# Patient Record
Sex: Male | Born: 1980 | Race: White | Hispanic: No | Marital: Single | Smoking: Former smoker
Health system: Southern US, Community
[De-identification: ages and names within clinical notes are randomized; demographics above are authoritative.]

## PROBLEM LIST (undated history)

## (undated) DIAGNOSIS — K5900 Constipation, unspecified: Secondary | ICD-10-CM

## (undated) DIAGNOSIS — G44229 Chronic tension-type headache, not intractable: Secondary | ICD-10-CM

## (undated) DIAGNOSIS — K219 Gastro-esophageal reflux disease without esophagitis: Secondary | ICD-10-CM

## (undated) DIAGNOSIS — K649 Unspecified hemorrhoids: Secondary | ICD-10-CM

## (undated) DIAGNOSIS — F329 Major depressive disorder, single episode, unspecified: Secondary | ICD-10-CM

## (undated) DIAGNOSIS — F32A Depression, unspecified: Secondary | ICD-10-CM

## (undated) HISTORY — DX: Gastro-esophageal reflux disease without esophagitis: K21.9

## (undated) HISTORY — DX: Chronic tension-type headache, not intractable: G44.229

## (undated) HISTORY — DX: Hemochromatosis, unspecified: E83.119

## (undated) HISTORY — DX: Constipation, unspecified: K59.00

## (undated) HISTORY — DX: Unspecified hemorrhoids: K64.9

---
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
## 2007-03-13 ENCOUNTER — Ambulatory Visit: Payer: Self-pay | Admitting: Family Medicine

## 2013-07-26 ENCOUNTER — Encounter: Payer: Self-pay | Admitting: Family Medicine

## 2013-07-26 ENCOUNTER — Ambulatory Visit (INDEPENDENT_AMBULATORY_CARE_PROVIDER_SITE_OTHER): Payer: 59 | Admitting: Family Medicine

## 2013-07-26 VITALS — BP 114/72 | HR 71 | Temp 97.0°F | Ht 68.5 in | Wt 125.4 lb

## 2013-07-26 DIAGNOSIS — K089 Disorder of teeth and supporting structures, unspecified: Secondary | ICD-10-CM

## 2013-07-26 DIAGNOSIS — Z0000 Encounter for general adult medical examination without abnormal findings: Secondary | ICD-10-CM

## 2013-07-26 LAB — POCT CBC
Granulocyte percent: 69.3 %G (ref 37–80)
HCT, POC: 41.1 % — AB (ref 43.5–53.7)
Hemoglobin: 14 g/dL — AB (ref 14.1–18.1)
Lymph, poc: 1.1 (ref 0.6–3.4)
MCH, POC: 31.8 pg — AB (ref 27–31.2)
MCHC: 34.2 g/dL (ref 31.8–35.4)
MCV: 93 fL (ref 80–97)
MPV: 7.9 fL (ref 0–99.8)
POC Granulocyte: 4 (ref 2–6.9)
POC LYMPH PERCENT: 20 %L (ref 10–50)
Platelet Count, POC: 182 10*3/uL (ref 142–424)
RBC: 4.4 M/uL — AB (ref 4.69–6.13)
RDW, POC: 12.9 %
WBC: 5.7 10*3/uL (ref 4.6–10.2)

## 2013-07-26 NOTE — Progress Notes (Signed)
  Subjective:    Patient ID: Jacob Eaton, male    DOB: 1980/12/22, 32 y.o.   MRN: [id number]  HPI This 32 y.o. male presents for evaluation of CPE.  He has hx of poor dentition And he is planning on getting his lower teeth extracted.  He otherwise has been Doing fine and has no acute complaints.  He is thin and is going to try and gain 10 pounds.   Review of Systems C/o poor dentition. No chest pain, SOB, HA, dizziness, vision change, N/V, diarrhea, constipation, dysuria, urinary urgency or frequency, myalgias, arthralgias or rash.     Objective:   Physical Exam Vital signs noted  Thin male in NAD.  HEENT - Head atraumatic Normocephalic                Eyes - PERRLA, Conjuctiva - clear Sclera- Clear EOMI                Ears - EAC's Wnl TM's Wnl Gross Hearing WNL                Nose - Nares patent                 Throat - oropharanx wnl poor dentition and no teeth on upper. Respiratory - Lungs CTA bilateral Cardiac - RRR S1 and S2 without murmur GI - Abdomen soft Nontender and bowel sounds active x 4 Extremities - No edema. Neuro - Grossly intact.       Assessment & Plan:  Routine general medical examination at a health care facility - Plan: POCT CBC, CMP14+EGFR, Lipid panel, Thyroid Panel With TSH  Poor dentition- Follow up with dentist.  Discussed he should gain some weight and make sure eating 3 meals a day.  Deatra Canter FNP

## 2013-07-26 NOTE — Patient Instructions (Signed)
Testicular Self-Exam  A self-examination of your testicles involves looking at and feeling your testicles for abnormal lumps or swelling. Several things can cause swelling, lumps, or pain in your testicles. Some of these causes are:  · Injuries.  · Inflammation.  · Infection.  · Accumulation of fluids around your testicle (hydrocele).  · Twisted testicles (testicular torsion).  · Testicular cancer.  Self-examination of the testicles and groin areas may be advised if you are at risk for testicular cancer. Risks for testicular cancer include:  · An undescended testicle (cryptorchidism).  · A history of previous testicular cancer.  · A family history of testicular cancer.  The testicles are easiest to examine after warm baths or showers and are more difficult to examine when you are cold. This is because the muscles attached to the testicles retract and pull them up higher or into the abdomen.  Follow these steps while you are standing:  · Hold your penis away from your body.  · Roll one testicle between your thumb and forefinger, feeling the entire testicle.  · Roll the other testicle between your thumb and forefinger, feeling the entire testicle.  Feel for lumps, swelling, or discomfort. A normal testicle is egg shaped and feels firm. It is smooth and not tender. The spermatic cord can be felt as a firm spaghetti-like cord at the back of your testicle. It is also important to examine the crease between the front of your leg and your abdomen. Feel for any bumps that are tender. These could be enlarged lymph nodes.   Document Released: 12/12/2000 Document Revised: 05/08/2013 Document Reviewed: 02/25/2013  [employer]® Patient Information ©2014 [employer].

## 2013-07-27 LAB — CMP14+EGFR
ALT: 20 IU/L (ref 0–44)
AST: 20 IU/L (ref 0–40)
Albumin/Globulin Ratio: 2.3 (ref 1.1–2.5)
Albumin: 4.6 g/dL (ref 3.5–5.5)
Alkaline Phosphatase: 162 IU/L — ABNORMAL HIGH (ref 39–117)
BUN/Creatinine Ratio: 11 (ref 8–19)
BUN: 10 mg/dL (ref 6–20)
CO2: 25 mmol/L (ref 18–29)
Calcium: 9.3 mg/dL (ref 8.7–10.2)
Chloride: 99 mmol/L (ref 97–108)
Creatinine, Ser: 0.88 mg/dL (ref 0.76–1.27)
GFR calc Af Amer: 130 mL/min/{1.73_m2} (ref >59)
GFR calc non Af Amer: 113 mL/min/{1.73_m2} (ref >59)
Globulin, Total: 2 g/dL (ref 1.5–4.5)
Glucose: 87 mg/dL (ref 65–99)
Potassium: 3.9 mmol/L (ref 3.5–5.2)
Sodium: 139 mmol/L (ref 134–144)
Total Bilirubin: 0.5 mg/dL (ref 0.0–1.2)
Total Protein: 6.6 g/dL (ref 6.0–8.5)

## 2013-07-27 LAB — LIPID PANEL
Chol/HDL Ratio: 3.5 ratio units (ref 0.0–5.0)
Cholesterol, Total: 187 mg/dL (ref 100–199)
HDL: 54 mg/dL (ref >39)
LDL Calculated: 116 mg/dL — ABNORMAL HIGH (ref 0–99)
Triglycerides: 87 mg/dL (ref 0–149)
VLDL Cholesterol Cal: 17 mg/dL (ref 5–40)

## 2013-07-27 LAB — THYROID PANEL WITH TSH
Free Thyroxine Index: 2.6 (ref 1.2–4.9)
T3 Uptake Ratio: 35 % (ref 24–39)
T4, Total: 7.3 ug/dL (ref 4.5–12.0)
TSH: 2.05 u[IU]/mL (ref 0.450–4.500)

## 2014-07-10 ENCOUNTER — Telehealth: Payer: Self-pay | Admitting: Family Medicine

## 2014-07-11 NOTE — Telephone Encounter (Signed)
Patient stated that he will call back to schedule once he gets his schedule

## 2016-07-18 ENCOUNTER — Emergency Department (HOSPITAL_COMMUNITY): Payer: BLUE CROSS/BLUE SHIELD

## 2016-07-18 ENCOUNTER — Encounter (HOSPITAL_COMMUNITY): Payer: Self-pay | Admitting: Emergency Medicine

## 2016-07-18 ENCOUNTER — Emergency Department (HOSPITAL_COMMUNITY)
Admission: EM | Admit: 2016-07-18 | Discharge: 2016-07-18 | Disposition: A | Discharge status: Home / Self Care | Payer: BLUE CROSS/BLUE SHIELD | Attending: Emergency Medicine | Admitting: Emergency Medicine

## 2016-07-18 DIAGNOSIS — Z87891 Personal history of nicotine dependence: Secondary | ICD-10-CM | POA: Insufficient documentation

## 2016-07-18 DIAGNOSIS — R110 Nausea: Secondary | ICD-10-CM | POA: Insufficient documentation

## 2016-07-18 DIAGNOSIS — R1031 Right lower quadrant pain: Secondary | ICD-10-CM | POA: Diagnosis not present

## 2016-07-18 LAB — CBC
HCT: 43.3 % (ref 39.0–52.0)
Hemoglobin: 14.9 g/dL (ref 13.0–17.0)
MCH: 33 pg (ref 26.0–34.0)
MCHC: 34.4 g/dL (ref 30.0–36.0)
MCV: 95.8 fL (ref 78.0–100.0)
PLATELETS: 206 10*3/uL (ref 150–400)
RBC: 4.52 MIL/uL (ref 4.22–5.81)
RDW: 12.6 % (ref 11.5–15.5)
WBC: 4.3 10*3/uL (ref 4.0–10.5)

## 2016-07-18 LAB — URINALYSIS, ROUTINE W REFLEX MICROSCOPIC
BILIRUBIN URINE: NEGATIVE
Glucose, UA: NEGATIVE mg/dL
HGB URINE DIPSTICK: NEGATIVE
Ketones, ur: NEGATIVE mg/dL
Leukocytes, UA: NEGATIVE
Nitrite: NEGATIVE
PROTEIN: NEGATIVE mg/dL
Specific Gravity, Urine: 1.01 (ref 1.005–1.030)
pH: 7 (ref 5.0–8.0)

## 2016-07-18 LAB — COMPREHENSIVE METABOLIC PANEL
ALK PHOS: 185 U/L — AB (ref 38–126)
ALT: 44 U/L (ref 17–63)
AST: 33 U/L (ref 15–41)
Albumin: 4.3 g/dL (ref 3.5–5.0)
Anion gap: 7 (ref 5–15)
BILIRUBIN TOTAL: 0.6 mg/dL (ref 0.3–1.2)
BUN: 12 mg/dL (ref 6–20)
CALCIUM: 9.1 mg/dL (ref 8.9–10.3)
CO2: 25 mmol/L (ref 22–32)
CREATININE: 0.93 mg/dL (ref 0.61–1.24)
Chloride: 104 mmol/L (ref 101–111)
GFR calc non Af Amer: >60 mL/min (ref >60)
Glucose, Bld: 84 mg/dL (ref 65–99)
Potassium: 3.6 mmol/L (ref 3.5–5.1)
SODIUM: 136 mmol/L (ref 135–145)
Total Protein: 7.1 g/dL (ref 6.5–8.1)

## 2016-07-18 LAB — LIPASE, BLOOD: Lipase: 15 U/L (ref 11–51)

## 2016-07-18 MED ORDER — ONDANSETRON 4 MG PO TBDP: ORAL_TABLET | ORAL | 0 refills | Status: DC

## 2016-07-18 MED ORDER — IOPAMIDOL (ISOVUE-300) INJECTION 61%
100.0000 mL | Freq: Once | INTRAVENOUS | Status: AC | PRN
  Administered 2016-07-18: 100 mL via INTRAVENOUS

## 2016-07-18 MED ORDER — PANTOPRAZOLE SODIUM 20 MG PO TBEC: 20.0000 mg | DELAYED_RELEASE_TABLET | Freq: Every day | ORAL | 0 refills | Status: DC

## 2016-07-18 MED ORDER — TRAMADOL HCL 50 MG PO TABS: 50.0000 mg | ORAL_TABLET | Freq: Four times a day (QID) | ORAL | 0 refills | Status: DC | PRN

## 2016-07-18 MED ORDER — IOPAMIDOL (ISOVUE-300) INJECTION 61%
INTRAVENOUS | Status: AC
  Filled 2016-07-18: qty 30

## 2016-07-18 MED ORDER — ONDANSETRON HCL 4 MG/2ML IJ SOLN
4.0000 mg | Freq: Once | INTRAMUSCULAR | Status: AC
  Administered 2016-07-18: 4 mg via INTRAVENOUS
  Filled 2016-07-18: qty 2

## 2016-07-18 MED ORDER — HYDROMORPHONE HCL 1 MG/ML IJ SOLN
1.0000 mg | Freq: Once | INTRAMUSCULAR | Status: AC
  Administered 2016-07-18: 1 mg via INTRAVENOUS
  Filled 2016-07-18: qty 1

## 2016-07-18 NOTE — ED Triage Notes (Signed)
Patient complaining of right lower quadrant abdominal pain x 2 weeks. Denies vomiting or diarrhea.

## 2016-07-18 NOTE — Discharge Instructions (Signed)
Follow-up with the GI doctor,  dr. Jena Gaussourk in 1-2 weeks

## 2016-07-18 NOTE — ED Provider Notes (Signed)
[facility]EM[facility]ENCY DEPT Provider Note   CSN: [id number] Arrival date & time: 07/18/16  1154  By signing my name below, I, Avnee Patel, attest that this documentation has been prepared under the direction and in the presence of Bethann BerkshireJoseph Melek Pownall, MD  Electronically Signed: Clovis PuAvnee Patel, ED Scribe. 07/18/16. 12:28 PM.   History   Chief Complaint Chief Complaint  Patient presents with  . Abdominal Pain   The history is provided by the patient. No language interpreter was used.  Abdominal Pain   This is a new problem. The current episode started more than 1 week ago. The problem has not changed since onset.The pain is located in the RLQ. The quality of the pain is sharp. The pain is moderate. Associated symptoms include nausea. Pertinent negatives include fever, diarrhea, vomiting, frequency, hematuria and headaches. Nothing relieves the symptoms.    HPI Comments:  Jacob Eaton is a 35 y.o. male who presents to the Emergency Department complaining of moderate RLQ pain x 2 weeks. Associated symptoms includes nausea. Pt denies vomiting, fevers, chills and any abdominal surgery. No alleviating factors noted. Pt denies any other symptoms or complaints at this time.  Past Medical History:  Diagnosis Date  . Chronic tension headaches     Patient Active Problem List   Diagnosis Date Noted  . Poor dentition 07/26/2013    Past Surgical History:  Procedure Laterality Date  . ESOPHAGEAL ATRESIA REPAIR Bilateral 1990    Home Medications    Prior to Admission medications   Medication Sig Start Date End Date Taking? Authorizing Provider  cyclobenzaprine (FLEXERIL) 10 MG tablet Take 10 mg by mouth 3 (three) times daily as needed for muscle spasms.    Historical Provider, MD    Family History Family History  Problem Relation Age of Onset  . Heart disease Father   . Cancer Father   . Asthma Sister     Social History Social History  Substance Use Topics  . Smoking status: Former Smoker      Types: Cigarettes    Start date: 09/20/1999    Quit date: 09/19/2000  . Smokeless tobacco: Never Used  . Alcohol use Yes     Comment: rare     Allergies   Review of patient's allergies indicates no known allergies.   Review of Systems Review of Systems  Constitutional: Negative for appetite change, chills, fatigue and fever.  HENT: Negative for congestion, ear discharge and sinus pressure.   Eyes: Negative for discharge.  Respiratory: Negative for cough.   Cardiovascular: Negative for chest pain.  Gastrointestinal: Positive for abdominal pain and nausea. Negative for diarrhea and vomiting.  Genitourinary: Negative for frequency and hematuria.  Musculoskeletal: Negative for back pain.  Skin: Negative for rash.  Neurological: Negative for seizures and headaches.  Psychiatric/Behavioral: Negative for hallucinations.     Physical Exam Updated Vital Signs BP 126/86 (BP Location: Right Arm)   Pulse 77   Temp 98.1 F (36.7 C) (Oral)   Resp 18   Ht 5\' 11"  (1.803 m)   Wt 125 lb (56.7 kg)   SpO2 100%   BMI 17.43 kg/m   Physical Exam  Constitutional: He is oriented to person, place, and time. He appears well-developed.  HENT:  Head: Normocephalic.  Eyes: Conjunctivae and EOM are normal. No scleral icterus.  Neck: Neck supple. No thyromegaly present.  Cardiovascular: Normal rate and regular rhythm.  Exam reveals no gallop and no friction rub.   No murmur heard. Pulmonary/Chest: No stridor. He has no  wheezes. He has no rales. He exhibits no tenderness.  Abdominal: He exhibits no distension. There is tenderness. There is no rebound.  Moderate RLQ tenderness  Musculoskeletal: Normal range of motion. He exhibits no edema.  Lymphadenopathy:    He has no cervical adenopathy.  Neurological: He is oriented to person, place, and time. He exhibits normal muscle tone. Coordination normal.  Skin: No rash noted. No erythema.  Psychiatric: He has a normal mood and affect. His behavior  is normal.  Nursing note and vitals reviewed.    ED Treatments / Results  DIAGNOSTIC STUDIES:  Oxygen Saturation is 100% on RA, normal by my interpretation.    COORDINATION OF CARE:  12:25 PM Discussed treatment plan with pt at bedside and pt agreed to plan.  Labs (all labs ordered are listed, but only abnormal results are displayed) Labs Reviewed  LIPASE, BLOOD  COMPREHENSIVE METABOLIC PANEL  CBC  URINALYSIS, ROUTINE W REFLEX MICROSCOPIC (NOT AT [facility])    EKG  EKG Interpretation None       Radiology No results found.  Procedures Procedures (including critical care time)  Medications Ordered in ED Medications - No data to display   Initial Impression / Assessment and Plan / ED Course  I have reviewed the triage vital signs and the nursing notes.  Pertinent labs & imaging results that were available during my care of the patient were reviewed by me and considered in my medical decision making (see chart for details).  Clinical Course    Patient with lower abdominal pain. CT scan unremarkable. Will place patient on protonic and pain medicine and referral to GI  Final Clinical Impressions(s) / ED Diagnoses   Final diagnoses:  None    New Prescriptions New Prescriptions   No medications on file  The chart was scribed for me under my direct supervision.  I personally performed the history, physical, and medical decision making and all procedures in the evaluation of this patient.Bethann Berkshire.     Korbyn Chopin, MD 07/18/16 [phone]

## 2016-07-25 ENCOUNTER — Encounter: Payer: Self-pay | Admitting: Gastroenterology

## 2016-08-16 ENCOUNTER — Encounter: Payer: Self-pay | Admitting: Gastroenterology

## 2016-08-16 ENCOUNTER — Ambulatory Visit (INDEPENDENT_AMBULATORY_CARE_PROVIDER_SITE_OTHER): Payer: BLUE CROSS/BLUE SHIELD | Admitting: Gastroenterology

## 2016-08-16 VITALS — BP 114/73 | HR 83 | Temp 97.0°F | Ht 71.0 in | Wt 134.0 lb

## 2016-08-16 DIAGNOSIS — R748 Abnormal levels of other serum enzymes: Secondary | ICD-10-CM | POA: Diagnosis not present

## 2016-08-16 DIAGNOSIS — R1011 Right upper quadrant pain: Secondary | ICD-10-CM | POA: Diagnosis not present

## 2016-08-16 MED ORDER — PANTOPRAZOLE SODIUM 40 MG PO TBEC
40.0000 mg | DELAYED_RELEASE_TABLET | Freq: Every day | ORAL | 3 refills | Status: DC
Start: 2016-08-16 — End: 2017-01-04

## 2016-08-16 NOTE — Progress Notes (Signed)
Primary Care Physician:  Rudi HeapMOORE, DONALD, MD Primary Gastroenterologist:  Dr. Darrick PennaFields   Chief Complaint  Patient presents with  . Abdominal Pain    HPI:   Jacob CoriaJeffrey Eaton is a 35 y.o. male presenting today at the request of the ED secondary to abdominal pain. Onset approximately 2 months ago; however, he notes several milder episodes years prior. Presented to the ED Jul 18, 2016. Pain located to right of umbilicus and RUQ. Pain comes and goes on its own. Pain occurs a few hours after eating as well. Avoiding fried foods. Eats baked, broiled. While at work will have sharp pains that hit him and unable to move. First episode of pain was while laying down at night, had to stay curled up for awhile before pain eased off. Associated nausea, no vomiting. Nausea medication made it worse. Decreased appetite. No weight loss. Baseline weight in 130s. No typical reflux symptoms. Taking Protonix 20 mg daily. No constipation or diarrhea. No rectal bleeding. Takes rare Ibuprofen for headaches.   Mother present today, and she lives with patient. States "we help each other".   Past Medical History:  Diagnosis Date  . Chronic tension headaches     Past Surgical History:  Procedure Laterality Date  . ESOPHAGEAL ATRESIA REPAIR  1990    Current Outpatient Prescriptions  Medication Sig Dispense Refill  . cetirizine (ZYRTEC) 10 MG tablet Take 10 mg by mouth daily.    . pantoprazole (PROTONIX) 40 MG tablet Take 1 tablet (40 mg total) by mouth daily. Take 30 minutes before breakfast 90 tablet 3   No current facility-administered medications for this visit.     Allergies as of 08/16/2016  . (No Known Allergies)    Family History  Problem Relation Age of Onset  . Heart disease Father   . Cancer Father   . Asthma Sister   . Colon cancer Neg Hx   . Colon polyps Neg Hx     Social History   Social History  . Marital status: Single    Spouse name: N/A  . Number of children: N/A  . Years of  education: N/A   Occupational History  . Not on file.   Social History Main Topics  . Smoking status: Former Smoker    Types: Cigarettes    Start date: 09/20/1999    Quit date: 09/19/2000  . Smokeless tobacco: Never Used  . Alcohol use Yes     Comment: rare  . Drug use: No  . Sexual activity: Not on file   Other Topics Concern  . Not on file   Social History Narrative  . No narrative on file    Review of Systems: Gen: see HPI  CV: Denies chest pain, heart palpitations, peripheral edema, syncope.  Resp: Denies shortness of breath at rest or with exertion. Denies wheezing or cough.  GI: see HPI  GU : Denies urinary burning, urinary frequency, urinary hesitancy MS: +joint pain  Derm: Denies rash, itching, dry skin Psych: Denies depression, anxiety, memory loss, and confusion Heme: see HPI   Physical Exam: BP 114/73   Pulse 83   Temp 97 F (36.1 C) (Oral)   Ht 5\' 11"  (1.803 m)   Wt 134 lb (60.8 kg)   BMI 18.69 kg/m  General:   Alert and oriented. Pleasant and cooperative. Thin.  Head:  Normocephalic and atraumatic. Eyes:  Without icterus, sclera clear and conjunctiva pink.  Ears:  Normal auditory acuity. Nose:  No deformity, discharge,  or  lesions. Mouth:  Poor oral dentition Lungs:  Clear to auscultation bilaterally. No wheezes, rales, or rhonchi. No distress.  Heart:  S1, S2 present without murmurs appreciated.  Abdomen:  +BS, soft, TTP RUQ and epigastric and non-distended. No HSM noted. No guarding or rebound. No masses appreciated.  Rectal:  Deferred  Msk:  Symmetrical without gross deformities. Normal posture. Extremities:  Without  edema. Neurologic:  Alert and  oriented x4;  grossly normal neurologically. Psych:  Alert and cooperative. Normal mood and affect.  Lab Results  Component Value Date   ALT 44 07/18/2016   AST 33 07/18/2016   ALKPHOS 185 (H) 07/18/2016   BILITOT 0.6 07/18/2016   Lab Results  Component Value Date   WBC 4.3 07/18/2016   HGB  14.9 07/18/2016   HCT 43.3 07/18/2016   MCV 95.8 07/18/2016   PLT 206 07/18/2016   Lab Results  Component Value Date   CREATININE 0.93 07/18/2016   BUN 12 07/18/2016   NA 136 07/18/2016   K 3.6 07/18/2016   CL 104 07/18/2016   CO2 25 07/18/2016  '

## 2016-08-16 NOTE — Assessment & Plan Note (Signed)
Concern for biliary etiology. CT normal. Proceed with HIDA scan. Continue Protonix but at an increased dose of 40 mg daily. Prescription provided. Further recommendations to follow.

## 2016-08-16 NOTE — Patient Instructions (Signed)
Please have blood work done today.   We have ordered a special scan of your gallbladder to be completed.   I sent in a new prescription for Protonix to take first thing in the morning on an empty stomach, 30 minutes before breakfast.

## 2016-08-16 NOTE — Assessment & Plan Note (Signed)
Isolated mild elevation, prior measurement in 2014 also elevated. Check GGT, AMA, ANA, ASMA.

## 2016-08-17 LAB — ANA: Anti Nuclear Antibody(ANA): NEGATIVE

## 2016-08-17 LAB — GAMMA GT: GGT: 102 U/L — AB (ref 7–51)

## 2016-08-17 NOTE — Progress Notes (Signed)
cc'ed to pcp °

## 2016-08-18 LAB — ANTI-SMOOTH MUSCLE ANTIBODY, IGG

## 2016-08-18 LAB — MITOCHONDRIAL ANTIBODIES: Mitochondrial M2 Ab, IgG: <=20 Units (ref <=20.0)

## 2016-08-19 ENCOUNTER — Encounter (HOSPITAL_COMMUNITY): Payer: Self-pay

## 2016-08-19 ENCOUNTER — Ambulatory Visit (HOSPITAL_COMMUNITY)
Admission: RE | Admit: 2016-08-19 | Discharge: 2016-08-19 | Disposition: A | Discharge status: Home / Self Care | Payer: BLUE CROSS/BLUE SHIELD | Source: Ambulatory Visit | Attending: Gastroenterology | Admitting: Gastroenterology

## 2016-08-19 DIAGNOSIS — R1011 Right upper quadrant pain: Secondary | ICD-10-CM | POA: Insufficient documentation

## 2016-08-19 MED ORDER — TECHNETIUM TC 99M MEBROFENIN IV KIT
5.0000 | PACK | Freq: Once | INTRAVENOUS | Status: AC | PRN
  Administered 2016-08-19: 5.5 via INTRAVENOUS

## 2016-08-25 ENCOUNTER — Telehealth: Payer: Self-pay | Admitting: Gastroenterology

## 2016-08-25 NOTE — Progress Notes (Signed)
LMOM to call.

## 2016-08-25 NOTE — Telephone Encounter (Signed)
Patient had LMOM that he was returning a call. Please call him back [phone]

## 2016-08-25 NOTE — Progress Notes (Signed)
GGT elevated, which points towards liver origin. AMA, ASMA, ANA normal. HIDA with good EF, but he did have pain with this. Needs RUQ ultrasound of liver/gallbladder, as CT can miss stones.

## 2016-08-25 NOTE — Telephone Encounter (Signed)
I called and his mom answered and said he is at work. She will have him call me.

## 2016-08-26 ENCOUNTER — Other Ambulatory Visit: Payer: Self-pay

## 2016-08-26 DIAGNOSIS — R1011 Right upper quadrant pain: Secondary | ICD-10-CM

## 2016-08-26 NOTE — Progress Notes (Signed)
PT is aware and OK to schedule the US. If pt is not at home, ok to tell his mom ( paperwork on file). Home Number [phone].  Forwarding to [facility] to schedule.

## 2016-08-26 NOTE — Telephone Encounter (Signed)
See result note. Pt is aware.  

## 2016-08-31 ENCOUNTER — Ambulatory Visit (HOSPITAL_COMMUNITY)
Admission: RE | Admit: 2016-08-31 | Discharge: 2016-08-31 | Disposition: A | Discharge status: Home / Self Care | Payer: BLUE CROSS/BLUE SHIELD | Source: Ambulatory Visit | Attending: Gastroenterology | Admitting: Gastroenterology

## 2016-08-31 DIAGNOSIS — R1011 Right upper quadrant pain: Secondary | ICD-10-CM | POA: Diagnosis not present

## 2016-09-09 NOTE — Progress Notes (Signed)
Recommend MRCP due to question of ductal dilatation and elevated alk phos. Thus far, AMA, ANA, ASMA normal. Let's have him follow-up in 6 weeks.

## 2016-09-14 NOTE — Progress Notes (Signed)
LMOM to call.

## 2016-09-15 ENCOUNTER — Other Ambulatory Visit: Payer: Self-pay

## 2016-09-15 ENCOUNTER — Encounter: Payer: Self-pay | Admitting: Gastroenterology

## 2016-09-15 ENCOUNTER — Telehealth: Payer: Self-pay

## 2016-09-15 DIAGNOSIS — R935 Abnormal findings on diagnostic imaging of other abdominal regions, including retroperitoneum: Secondary | ICD-10-CM

## 2016-09-15 DIAGNOSIS — R1011 Right upper quadrant pain: Secondary | ICD-10-CM

## 2016-09-15 NOTE — Telephone Encounter (Signed)
Attempted to submit PA for MRI MRCP online for [employer]. Received the following message "The selected member is currently not eligible for and order number request. Please contact the health plan." Called [employer] and was informed that plan does not require prior authorization.

## 2016-09-15 NOTE — Progress Notes (Signed)
APPT MADE AND LETTER SENT  °

## 2016-09-15 NOTE — Progress Notes (Signed)
LMOM to call. Mailing a letter to call also.  

## 2016-09-15 NOTE — Progress Notes (Signed)
PT is aware. OK to schedule the MRCP and ov appt.

## 2016-09-21 ENCOUNTER — Other Ambulatory Visit: Payer: Self-pay | Admitting: Gastroenterology

## 2016-09-21 ENCOUNTER — Ambulatory Visit (HOSPITAL_COMMUNITY)
Admission: RE | Admit: 2016-09-21 | Discharge: 2016-09-21 | Disposition: A | Discharge status: Home / Self Care | Payer: BLUE CROSS/BLUE SHIELD | Source: Ambulatory Visit | Attending: Gastroenterology | Admitting: Gastroenterology

## 2016-09-21 DIAGNOSIS — R935 Abnormal findings on diagnostic imaging of other abdominal regions, including retroperitoneum: Secondary | ICD-10-CM | POA: Insufficient documentation

## 2016-09-21 DIAGNOSIS — K760 Fatty (change of) liver, not elsewhere classified: Secondary | ICD-10-CM | POA: Insufficient documentation

## 2016-09-21 DIAGNOSIS — R1011 Right upper quadrant pain: Secondary | ICD-10-CM

## 2016-09-21 MED ORDER — GADOBENATE DIMEGLUMINE 529 MG/ML IV SOLN
15.0000 mL | Freq: Once | INTRAVENOUS | Status: AC | PRN
  Administered 2016-09-21: 12 mL via INTRAVENOUS

## 2016-09-28 NOTE — Progress Notes (Signed)
Pt is aware of results and aware to keep appt. York SpanielSaid he is doing well at this time.

## 2016-09-28 NOTE — Progress Notes (Signed)
Mild fatty liver. No biliary ductal dilation. Let's have him return in follow-up to see how he is doing.

## 2016-09-28 NOTE — Progress Notes (Signed)
LMOM for a return call. ( He already has appt on 10/31/2016 at 11:00 Am with Lewie LoronAnna Boone, NP.)

## 2016-10-31 ENCOUNTER — Telehealth: Payer: Self-pay | Admitting: Family Medicine

## 2016-10-31 ENCOUNTER — Encounter: Payer: Self-pay | Admitting: Gastroenterology

## 2016-10-31 ENCOUNTER — Ambulatory Visit (INDEPENDENT_AMBULATORY_CARE_PROVIDER_SITE_OTHER): Payer: BLUE CROSS/BLUE SHIELD | Admitting: Gastroenterology

## 2016-10-31 VITALS — BP 117/79 | HR 80 | Temp 98.2°F | Ht 71.0 in | Wt 140.6 lb

## 2016-10-31 DIAGNOSIS — R748 Abnormal levels of other serum enzymes: Secondary | ICD-10-CM | POA: Diagnosis not present

## 2016-10-31 DIAGNOSIS — R1011 Right upper quadrant pain: Secondary | ICD-10-CM | POA: Diagnosis not present

## 2016-10-31 NOTE — Assessment & Plan Note (Signed)
Resolved with PPI use, dietary/behavior modification. Thorough work-up with US, HIDA, MRI (due to ?biliary ductal dilatation). Discontinue Prilosec that he is taking in evening and only take Protonix each morning. May take Zantac prn. If worsening pain or other alarm symptoms would consider EGD. Return in June/July.

## 2016-10-31 NOTE — Assessment & Plan Note (Signed)
Chronic isolated elevation, last 185. AMA, ANA, ASMA normal. GGT elevated. US abdomen with fatty liver. Less than 1-2X upper limits of normal; I would like to monitor this for now and recheck HFP, but patient is unable to do until the summer secondary to finances. Will serially monitor and if persistent or elevation noted, would consider liver biopsy. Return in June/July.

## 2016-10-31 NOTE — Patient Instructions (Signed)
Continue Protonix once each morning on an empty stomach. Let's stop the Prilosec for now. You may take Zantac as needed in the evenings.   We need to do repeat blood work when you are able. Let's plan on the summer time.   We will see you in June or July 2018.

## 2016-10-31 NOTE — Progress Notes (Signed)
CC'D TO PCP °

## 2016-10-31 NOTE — Progress Notes (Signed)
Referring Provider: Chipper Herb, MD Primary Care Physician:  Redge Gainer, MD  Primary GI: Dr. Oneida Alar   Chief Complaint  Patient presents with  . Abdominal Pain    RUQ, doing better since taking meds and changing eating habits    HPI:   Jacob Eaton is a 36 y.o. male presenting today with a history of RUQ pain, isolated mild elevation of alk phos with negative AMA, ANA, ASMA. GGT was elevated. Known fatty liver. HIDA on file with normal EF at 66%. US abdomen without gallstones, minimal intrahepatic biliary ductal dilatation unable to be excluded. MRI without biliary ductal dilatation. States since he stopped eating spicy foods and eating better, his RUQ pain has almost completely resolved. Has been taking Protonix in the morning and Prilosec in evening because he wants to be "careful". No dysphagia. Weight up actually from last visit. Good appetite. No alarm features.   Past Medical History:  Diagnosis Date  . Chronic tension headaches     Past Surgical History:  Procedure Laterality Date  . ESOPHAGEAL ATRESIA REPAIR  1990    Current Outpatient Prescriptions  Medication Sig Dispense Refill  . cetirizine (ZYRTEC) 10 MG tablet Take 10 mg by mouth daily.    . pantoprazole (PROTONIX) 40 MG tablet Take 1 tablet (40 mg total) by mouth daily. Take 30 minutes before breakfast 90 tablet 3   No current facility-administered medications for this visit.     Allergies as of 10/31/2016  . (No Known Allergies)    Family History  Problem Relation Age of Onset  . Heart disease Father   . Cancer Father   . Asthma Sister   . Colon cancer Neg Hx   . Colon polyps Neg Hx     Social History   Social History  . Marital status: Single    Spouse name: N/A  . Number of children: N/A  . Years of education: N/A   Social History Main Topics  . Smoking status: Former Smoker    Types: Cigarettes    Start date: 09/20/1999    Quit date: 09/19/2000  . Smokeless tobacco: Never Used  .  Alcohol use Yes     Comment: rare  . Drug use: No  . Sexual activity: Not Asked   Other Topics Concern  . None   Social History Narrative  . None    Review of Systems: As mentioned in HPI   Physical Exam: BP 117/79   Pulse 80   Temp 98.2 F (36.8 C) (Oral)   Ht 5' 11"  (1.803 m)   Wt 140 lb 9.6 oz (63.8 kg)   BMI 19.61 kg/m  General:   Alert and oriented. No distress noted. Pleasant and cooperative.  Head:  Normocephalic and atraumatic. Eyes:  Conjuctiva clear without scleral icterus. Mouth:  Oral mucosa pink and moist. Edentulous  Abdomen:  +BS, soft, non-tender and non-distended. No rebound or guarding. No HSM or masses noted. Msk:  Symmetrical without gross deformities. Normal posture. Extremities:  Without edema. Neurologic:  Alert and  oriented x4;  grossly normal neurologically. Psych:  Alert and cooperative. Normal mood and affect.   Lab Results  Component Value Date   WBC 4.3 07/18/2016   HGB 14.9 07/18/2016   HCT 43.3 07/18/2016   MCV 95.8 07/18/2016   PLT 206 07/18/2016   Lab Results  Component Value Date   ALT 44 07/18/2016   AST 33 07/18/2016   ALKPHOS 185 (H) 07/18/2016   BILITOT 0.6 07/18/2016  Lab Results  Component Value Date   CREATININE 0.93 07/18/2016   BUN 12 07/18/2016   NA 136 07/18/2016   K 3.6 07/18/2016   CL 104 07/18/2016   CO2 25 07/18/2016

## 2016-11-03 NOTE — Telephone Encounter (Signed)
Attempted to contact pt several times to see if we were PCP of the PT and if we were to schedule a New Pt CPE with a provider, there was no luck with contacting the patient.

## 2016-12-15 ENCOUNTER — Other Ambulatory Visit: Payer: Self-pay | Admitting: Gastroenterology

## 2016-12-15 DIAGNOSIS — R748 Abnormal levels of other serum enzymes: Secondary | ICD-10-CM

## 2016-12-27 ENCOUNTER — Ambulatory Visit (INDEPENDENT_AMBULATORY_CARE_PROVIDER_SITE_OTHER): Payer: BLUE CROSS/BLUE SHIELD | Admitting: Family

## 2016-12-27 ENCOUNTER — Encounter: Payer: Self-pay | Admitting: Family

## 2016-12-27 VITALS — BP 129/79 | HR 79 | Temp 97.9°F | Ht 71.0 in | Wt 143.8 lb

## 2016-12-27 DIAGNOSIS — J0100 Acute maxillary sinusitis, unspecified: Secondary | ICD-10-CM | POA: Diagnosis not present

## 2016-12-27 MED ORDER — AMOXICILLIN-POT CLAVULANATE 875-125 MG PO TABS: 1.0000 | ORAL_TABLET | Freq: Two times a day (BID) | ORAL | 0 refills | Status: DC

## 2016-12-27 NOTE — Patient Instructions (Signed)

## 2016-12-27 NOTE — Progress Notes (Signed)
   Subjective:    Patient ID: Jacob Eaton, male    DOB: [birth year], 36 y.o.   MRN: [id number]  Cough  This is a new problem. The current episode started in the past 7 days. The problem has been gradually worsening. The problem occurs every few minutes. The cough is productive of sputum. Associated symptoms include chest pain, headaches, myalgias, nasal congestion, postnasal drip, rhinorrhea, a sore throat and wheezing. Pertinent negatives include no ear congestion or ear pain. The symptoms are aggravated by lying down. Risk factors for lung disease include smoking/tobacco exposure. He has tried rest and OTC cough suppressant for the symptoms. The treatment provided mild relief.  Chest Pain   Associated symptoms include a cough and headaches.      Review of Systems  HENT: Positive for postnasal drip, rhinorrhea and sore throat. Negative for ear pain.   Respiratory: Positive for cough and wheezing.   Cardiovascular: Positive for chest pain.  Musculoskeletal: Positive for myalgias.  Neurological: Positive for headaches.  All other systems reviewed and are negative.      Objective:   Physical Exam  Constitutional: He is oriented to person, place, and time. He appears well-developed and well-nourished. No distress.  HENT:  Head: Normocephalic.  Right Ear: External ear normal.  Left Ear: External ear normal.  Nose: Mucosal edema and rhinorrhea present. Right sinus exhibits maxillary sinus tenderness. Left sinus exhibits maxillary sinus tenderness.  Mouth/Throat: Posterior oropharyngeal erythema present.  Eyes: Pupils are equal, round, and reactive to light. Right eye exhibits no discharge. Left eye exhibits no discharge.  Neck: Normal range of motion. Neck supple. No thyromegaly present.  Cardiovascular: Normal rate, regular rhythm, normal heart sounds and intact distal pulses.   No murmur heard. Pulmonary/Chest: Effort normal and breath sounds normal. No respiratory distress. He has no  wheezes.  Abdominal: Soft. Bowel sounds are normal. He exhibits no distension. There is no tenderness.  Musculoskeletal: Normal range of motion. He exhibits no edema or tenderness.  Neurological: He is alert and oriented to person, place, and time. He has normal reflexes. No cranial nerve deficit.  Skin: Skin is warm and dry. No rash noted. No erythema.  Psychiatric: He has a normal mood and affect. His behavior is normal. Judgment and thought content normal.  Vitals reviewed.     BP 129/79   Pulse 79   Temp 97.9 F (36.6 C) (Oral)   Ht  (1.803 m)   Wt 143 lb 12.8 oz (65.2 kg)   BMI 20.06 kg/m      Assessment & Plan:  1. Acute maxillary sinusitis, recurrence not specified - Take meds as prescribed - Use a cool mist humidifier  -Use saline nose sprays frequently -Saline irrigations of the nose can be very helpful if done frequently.  * 4X daily for 1 week*  * Use of a nettie pot can be helpful with this. Follow directions with this* -Force fluids -For any cough or congestion  Use plain Mucinex- regular strength or max strength is fine   * Children- consult with Pharmacist for dosing -For fever or aces or pains- take tylenol or ibuprofen appropriate for age and weight.  * for fevers greater than 101 orally you may alternate ibuprofen and tylenol every  3 hours. -Throat lozenges if help -New toothbrush in 3 days - amoxicillin-clavulanate (AUGMENTIN) 875-125 MG tablet; Take 1 tablet by mouth 2 (two) times daily.  Dispense: 14 tablet; Refill: 0    Jannifer Rodney, FNP

## 2016-12-30 ENCOUNTER — Encounter: Payer: BLUE CROSS/BLUE SHIELD | Admitting: Family Medicine

## 2017-01-04 ENCOUNTER — Ambulatory Visit (INDEPENDENT_AMBULATORY_CARE_PROVIDER_SITE_OTHER): Payer: BLUE CROSS/BLUE SHIELD | Admitting: Family Medicine

## 2017-01-04 ENCOUNTER — Encounter: Payer: Self-pay | Admitting: Family Medicine

## 2017-01-04 VITALS — BP 109/73 | HR 72 | Temp 98.6°F | Ht 71.0 in | Wt 144.0 lb

## 2017-01-04 DIAGNOSIS — K760 Fatty (change of) liver, not elsewhere classified: Secondary | ICD-10-CM | POA: Diagnosis not present

## 2017-01-04 DIAGNOSIS — Z0000 Encounter for general adult medical examination without abnormal findings: Secondary | ICD-10-CM

## 2017-01-04 DIAGNOSIS — K000 Anodontia: Secondary | ICD-10-CM

## 2017-01-04 DIAGNOSIS — K08109 Complete loss of teeth, unspecified cause, unspecified class: Secondary | ICD-10-CM

## 2017-01-04 LAB — URINALYSIS
BILIRUBIN UA: NEGATIVE
Glucose, UA: NEGATIVE
Ketones, UA: NEGATIVE
Leukocytes, UA: NEGATIVE
NITRITE UA: NEGATIVE
PH UA: 7.5 (ref 5.0–7.5)
PROTEIN UA: NEGATIVE
RBC UA: NEGATIVE
SPEC GRAV UA: 1.015 (ref 1.005–1.030)
UUROB: 2 mg/dL — AB (ref 0.2–1.0)

## 2017-01-04 MED ORDER — PANTOPRAZOLE SODIUM 40 MG PO TBEC: 40.0000 mg | DELAYED_RELEASE_TABLET | Freq: Every day | ORAL | 3 refills | Status: DC

## 2017-01-04 NOTE — Progress Notes (Signed)
Subjective:  Patient ID: Jacob Eaton, male    DOB: 07/01/1981  Age: 36 y.o. MRN: [id number]  CC: Annual Exam (pt here today for annual exam "work physical". Pt has no concerns.)   HPI Jacob Eaton presents for wellness exam - hx fatty liver. Denies drinking excessively, only occasional small amounts   History Jacob Eaton has a past medical history of Chronic tension headaches.   He has a past surgical history that includes Esophageal atresia repair (1990).   His family history includes Asthma in his sister; Cancer in his father; Heart disease in his father.He reports that he quit smoking about 16 years ago. His smoking use included Cigarettes. He started smoking about 17 years ago. He has never used smokeless tobacco. He reports that he drinks alcohol. He reports that he does not use drugs.    ROS Review of Systems  Constitutional: Negative for activity change, appetite change, chills, diaphoresis, fatigue, fever and unexpected weight change.  HENT: Negative for congestion, ear pain, hearing loss, postnasal drip, rhinorrhea, sore throat, tinnitus and trouble swallowing.   Eyes: Negative for photophobia, pain, discharge and redness.  Respiratory: Negative for apnea, cough, choking, chest tightness, shortness of breath, wheezing and stridor.   Cardiovascular: Negative for chest pain, palpitations and leg swelling.  Gastrointestinal: Negative for abdominal distention, abdominal pain, blood in stool, constipation, diarrhea, nausea and vomiting.  Endocrine: Negative for cold intolerance, heat intolerance, polydipsia, polyphagia and polyuria.  Genitourinary: Negative for difficulty urinating, dysuria, enuresis, flank pain, frequency, genital sores, hematuria and urgency.  Musculoskeletal: Negative for arthralgias and joint swelling.  Skin: Negative for color change, rash and wound.  Allergic/Immunologic: Negative for immunocompromised state.  Neurological: Negative for dizziness, tremors,  seizures, syncope, facial asymmetry, speech difficulty, weakness, light-headedness, numbness and headaches.  Hematological: Does not bruise/bleed easily.  Psychiatric/Behavioral: Negative for agitation, behavioral problems, confusion, decreased concentration, dysphoric mood, hallucinations, sleep disturbance and suicidal ideas. The patient is not nervous/anxious and is not hyperactive.     Objective:  BP 109/73   Pulse 72   Temp 98.6 F (37 C) (Oral)   Ht _0  (1.803 m)   Wt 144 lb (65.3 kg)   BMI 20.08 kg/m   BP Readings from Last 3 Encounters:  01/04/17 109/73  12/27/16 129/79  10/31/16 117/79    Wt Readings from Last 3 Encounters:  01/04/17 144 lb (65.3 kg)  12/27/16 143 lb 12.8 oz (65.2 kg)  10/31/16 140 lb 9.6 oz (63.8 kg)     Physical Exam  Constitutional: He is oriented to person, place, and time. He appears well-developed and well-nourished.  HENT:  Head: Normocephalic and atraumatic.  Mouth/Throat: Oropharynx is clear and moist. He does not have dentures. Abnormal dentition (edentulous).  Eyes: EOM are normal. Pupils are equal, round, and reactive to light.  Neck: Normal range of motion. No tracheal deviation present. No thyromegaly present.  Cardiovascular: Normal rate, regular rhythm and normal heart sounds.  Exam reveals no gallop and no friction rub.   No murmur heard. Pulmonary/Chest: Breath sounds normal. He has no wheezes. He has no rales.  Abdominal: Soft. He exhibits no mass. There is no tenderness. Hernia confirmed negative in the right inguinal area and confirmed negative in the left inguinal area.  Genitourinary: Testes normal and penis normal.  Musculoskeletal: Normal range of motion. He exhibits no edema.  Neurological: He is alert and oriented to person, place, and time.  Skin: Skin is warm and dry.  Psychiatric: He has a normal mood and  affect.      Assessment & Plan:   Bravlio was seen today for annual exam.  Diagnoses and all orders for  this visit:  Well adult exam -     CBC with Differential/Platelet -     CMP14+EGFR -     Lipid panel -     Urinalysis  Nonalcoholic hepatosteatosis -     CMP14+EGFR  Edentulous  Other orders -     pantoprazole (PROTONIX) 40 MG tablet; Take 1 tablet (40 mg total) by mouth daily. Take 30 minutes before breakfast     patient is underweight. We discussed proper nutrition - particularly related to his edentulous condition. However he is not interested in dentures. He is able to eat most foods. He will focus on healthy diet to assist with some protein calorie improvement   I have discontinued [name] cetirizine and amoxicillin-clavulanate. I am also having him maintain his pantoprazole.  Allergies as of 01/04/2017   No Known Allergies     Medication List       Accurate as of 01/04/17  3:10 PM. Always use your most recent med list.          pantoprazole 40 MG tablet Commonly known as:  PROTONIX Take 1 tablet (40 mg total) by mouth daily. Take 30 minutes before breakfast        Follow-up: No Follow-up on file.  Jacob Eaton, M.D.

## 2017-01-05 LAB — LIPID PANEL
CHOLESTEROL TOTAL: 223 mg/dL — AB (ref 100–199)
Chol/HDL Ratio: 4.8 ratio (ref 0.0–5.0)
HDL: 46 mg/dL (ref >39)
LDL CALC: 133 mg/dL — AB (ref 0–99)
TRIGLYCERIDES: 220 mg/dL — AB (ref 0–149)
VLDL Cholesterol Cal: 44 mg/dL — ABNORMAL HIGH (ref 5–40)

## 2017-01-05 LAB — CMP14+EGFR
A/G RATIO: 2 (ref 1.2–2.2)
ALBUMIN: 4.5 g/dL (ref 3.5–5.5)
ALK PHOS: 284 IU/L — AB (ref 39–117)
ALT: 55 IU/L — ABNORMAL HIGH (ref 0–44)
AST: 36 IU/L (ref 0–40)
BILIRUBIN TOTAL: 0.5 mg/dL (ref 0.0–1.2)
BUN/Creatinine Ratio: 18 (ref 9–20)
BUN: 14 mg/dL (ref 6–20)
CO2: 21 mmol/L (ref 18–29)
CREATININE: 0.78 mg/dL (ref 0.76–1.27)
Calcium: 9.3 mg/dL (ref 8.7–10.2)
Chloride: 97 mmol/L (ref 96–106)
GFR calc Af Amer: 133 mL/min/{1.73_m2} (ref >59)
GFR calc non Af Amer: 115 mL/min/{1.73_m2} (ref >59)
GLOBULIN, TOTAL: 2.2 g/dL (ref 1.5–4.5)
Glucose: 82 mg/dL (ref 65–99)
Potassium: 4.3 mmol/L (ref 3.5–5.2)
SODIUM: 138 mmol/L (ref 134–144)
Total Protein: 6.7 g/dL (ref 6.0–8.5)

## 2017-01-05 LAB — CBC WITH DIFFERENTIAL/PLATELET
Basophils Absolute: 0 10*3/uL (ref 0.0–0.2)
Basos: 0 %
EOS (ABSOLUTE): 0.1 10*3/uL (ref 0.0–0.4)
EOS: 3 %
HEMATOCRIT: 44.6 % (ref 37.5–51.0)
HEMOGLOBIN: 15 g/dL (ref 13.0–17.7)
Immature Grans (Abs): 0 10*3/uL (ref 0.0–0.1)
Immature Granulocytes: 1 %
LYMPHS ABS: 0.9 10*3/uL (ref 0.7–3.1)
Lymphs: 20 %
MCH: 32 pg (ref 26.6–33.0)
MCHC: 33.6 g/dL (ref 31.5–35.7)
MCV: 95 fL (ref 79–97)
MONOCYTES: 13 %
MONOS ABS: 0.6 10*3/uL (ref 0.1–0.9)
NEUTROS ABS: 2.9 10*3/uL (ref 1.4–7.0)
Neutrophils: 63 %
Platelets: 240 10*3/uL (ref 150–379)
RBC: 4.69 x10E6/uL (ref 4.14–5.80)
RDW: 13 % (ref 12.3–15.4)
WBC: 4.6 10*3/uL (ref 3.4–10.8)

## 2017-02-06 ENCOUNTER — Other Ambulatory Visit: Payer: Self-pay

## 2017-02-06 DIAGNOSIS — R748 Abnormal levels of other serum enzymes: Secondary | ICD-10-CM

## 2017-02-09 NOTE — Progress Notes (Signed)
REVIEWED-NO ADDITIONAL RECOMMENDATIONS. 

## 2017-03-07 ENCOUNTER — Ambulatory Visit: Payer: BLUE CROSS/BLUE SHIELD | Admitting: Gastroenterology

## 2017-03-17 ENCOUNTER — Encounter: Payer: Self-pay | Admitting: Pediatrics

## 2017-03-17 ENCOUNTER — Ambulatory Visit (INDEPENDENT_AMBULATORY_CARE_PROVIDER_SITE_OTHER): Payer: BLUE CROSS/BLUE SHIELD | Admitting: Pediatrics

## 2017-03-17 ENCOUNTER — Ambulatory Visit (INDEPENDENT_AMBULATORY_CARE_PROVIDER_SITE_OTHER): Payer: BLUE CROSS/BLUE SHIELD

## 2017-03-17 VITALS — BP 114/79 | HR 76 | Temp 97.2°F | Ht 71.0 in | Wt 150.2 lb

## 2017-03-17 DIAGNOSIS — M25561 Pain in right knee: Secondary | ICD-10-CM

## 2017-03-17 NOTE — Progress Notes (Signed)
  Subjective:   Patient ID: Jacob Eaton, male    DOB: 10/25/1980, 36 y.o.   MRN: [id number] CC: Knee Pain (Right, 2 weeks)  HPI: Jacob Eaton is a 36 y.o. male presenting for Knee Pain (Right, 2 weeks)  R knee popped when he rolled over in sofa 1 week ago Difficulty weight bearing afterward Pain with weight bearing now Since then has had increased distension in R lower leg veins No calf swelling or tenderness No recent immobility, surgeries or trips No h/o clot Knee has felt more unstable since injury, has given out beneath him  Relevant past medical, surgical, family and social history reviewed. Allergies and medications reviewed and updated. History  Smoking Status  . Former Smoker  . Types: Cigarettes  . Start date: 09/20/1999  . Quit date: 09/19/2000  Smokeless Tobacco  . Never Used   ROS: Per HPI   Objective:    BP 114/79   Pulse 76   Temp 97.2 F (36.2 C) (Oral)   Ht 5\' 11"  (1.803 m)   Wt 150 lb 3.2 oz (68.1 kg)   BMI 20.95 kg/m   Wt Readings from Last 3 Encounters:  03/17/17 150 lb 3.2 oz (68.1 kg)  01/04/17 144 lb (65.3 kg)  12/27/16 143 lb 12.8 oz (65.2 kg)    Gen: NAD, alert, cooperative with exam, NCAT EYES: EOMI, no conjunctival injection, or no icterus CV: NRRR, normal S1/S2, no murmur, distal pulses 2+ b/l Resp: CTABL, no wheezes, normal WOB Ext: No edema, warm Neuro: Alert and oriented MSK: ttp R knee medial and lateral joint line Mild swelling R knee compared with L Pain behind R knee No redness, no calf swelling Skin: some distension varicose veins present R lower leg compared with L leg  Assessment & Plan:  [name] was seen today for knee pain.  Diagnoses and all orders for this visit:  Acute pain of right knee With pop and instability concern for ligamental injury Refer to ortho -     DG Knee 1-2 Views Right; Future -     Ambulatory referral to Orthopedic Surgery   Follow up plan: Return if symptoms worsen or fail to  improve. Rex Krasarol Nabilah Davoli, MD [facility]

## 2017-03-23 ENCOUNTER — Telehealth: Payer: Self-pay | Admitting: Pediatrics

## 2017-03-23 NOTE — Telephone Encounter (Signed)
Referral sent internally to Dr Romeo AppleHarrison in [place] They should call patient to set up   Their # is [phone]

## 2017-03-30 ENCOUNTER — Ambulatory Visit (INDEPENDENT_AMBULATORY_CARE_PROVIDER_SITE_OTHER): Payer: BLUE CROSS/BLUE SHIELD | Admitting: Orthopaedic Surgery

## 2017-03-30 ENCOUNTER — Encounter: Payer: Self-pay | Admitting: Orthopaedic Surgery

## 2017-03-30 VITALS — BP 119/85 | HR 84 | Temp 97.0°F | Ht 70.0 in | Wt 144.0 lb

## 2017-03-30 DIAGNOSIS — M25561 Pain in right knee: Secondary | ICD-10-CM

## 2017-03-30 MED ORDER — NAPROXEN 500 MG PO TABS: 500.0000 mg | ORAL_TABLET | Freq: Two times a day (BID) | ORAL | 5 refills | Status: DC

## 2017-03-30 NOTE — Progress Notes (Signed)
Subjective:    Patient ID: Jacob Eaton, male    DOB: 10/10/1980, 36 y.o.   MRN: [id number]  HPI He has had knee pain for about three to four weeks.  He got up off his sofa and felt a pop and then had bad pain in the right knee about the middle of June at his home.  He had continued pain and then swelling of the knee.  He was seen at [facility] on 03-17-17.  X-rays were done and were negative.  He has continued pain medially of the knee.  He has some feeling it will give way but it has not yet. He has swelling.  He works 12 hours a day and by the end of his shift he is very tender.  He has tried Advil, heat, ice, rubs with only slight help.  He has no trauma, no redness.  I have reviewed the notes from [place], the x-ray and report.  He is using a knee brace that helps just a little.  It is hot.   Review of Systems  HENT: Negative for congestion.   Respiratory: Negative for cough and shortness of breath.   Cardiovascular: Negative for chest pain and leg swelling.  Endocrine: Negative for cold intolerance.  Musculoskeletal: Positive for arthralgias, gait problem and joint swelling.  Allergic/Immunologic: Negative for environmental allergies.  Neurological: Positive for headaches.   Past Medical History:  Diagnosis Date  . Chronic tension headaches     Past Surgical History:  Procedure Laterality Date  . ESOPHAGEAL ATRESIA REPAIR  1990    Current Outpatient Prescriptions on File Prior to Visit  Medication Sig Dispense Refill  . pantoprazole (PROTONIX) 40 MG tablet Take 1 tablet (40 mg total) by mouth daily. Take 30 minutes before breakfast 90 tablet 3   No current facility-administered medications on file prior to visit.     Social History   Social History  . Marital status: Single    Spouse name: N/A  . Number of children: N/A  . Years of education: N/A   Occupational History  . Not on file.   Social History Main Topics  . Smoking status: Former  Smoker    Types: Cigarettes    Start date: 09/20/1999    Quit date: 09/19/2000  . Smokeless tobacco: Never Used  . Alcohol use Yes     Comment: rare  . Drug use: No  . Sexual activity: Not on file   Other Topics Concern  . Not on file   Social History Narrative  . No narrative on file    Family History  Problem Relation Age of Onset  . Seizures Mother   . Heart disease Father   . Cancer Father   . Asthma Sister   . Colon cancer Neg Hx   . Colon polyps Neg Hx     BP 119/85   Pulse 84   Temp (!) 97 F (36.1 C)   Ht 5\' 10"  (1.778 m)   Wt 144 lb (65.3 kg)   BMI 20.66 kg/m      Objective:   Physical Exam  Constitutional: He is oriented to person, place, and time. He appears well-developed and well-nourished.  HENT:  Head: Normocephalic and atraumatic.  Eyes: Pupils are equal, round, and reactive to light. Conjunctivae and EOM are normal.  Neck: Normal range of motion. Neck supple.  Cardiovascular: Normal rate, regular rhythm and intact distal pulses.   Pulmonary/Chest: Effort normal.  Abdominal: Soft.  Musculoskeletal: He  exhibits tenderness (Right knee tender, no effusion today, ROM 0 to 115 with pain medially, positive medial McMurray, knee otherwise stable, no redness, NV intact.  Left knee negative.).  Neurological: He is alert and oriented to person, place, and time. He has normal reflexes. He displays normal reflexes. No cranial nerve deficit. He exhibits normal muscle tone. Coordination normal.  Skin: Skin is warm and dry.  Psychiatric: He has a normal mood and affect. His behavior is normal. Judgment and thought content normal.  Vitals reviewed.         Assessment & Plan:   Encounter Diagnosis  Name Primary?  . Acute pain of right knee Yes   I feel he may have a meniscus tear of the right knee.  His insurance carrier requires 6 weeks of conservative treatment.  I have given exercises for the knee to do.  I will begin Naprosyn.  Precautions  discussed.  PROCEDURE NOTE:  The patient requests injections of the right knee , verbal consent was obtained.  The right knee was prepped appropriately after time out was performed.   Sterile technique was observed and injection of 1 cc of Depo-Medrol 40 mg with several cc's of plain xylocaine. Anesthesia was provided by ethyl chloride and a 20-gauge needle was used to inject the knee area. The injection was tolerated well.  A band aid dressing was applied.  The patient was advised to apply ice later today and tomorrow to the injection sight as needed.  Return in two weeks.  Call if any problem.  Precautions discussed.    Electronically Signed Darreld McleanWayne Karuna Balducci, MD 7/12/20189:05 AM

## 2017-03-30 NOTE — Patient Instructions (Signed)
Meniscus Tear A meniscus tear is a knee injury in which a piece of the meniscus is torn. The meniscus is a thick, rubbery, wedge-shaped cartilage in the knee. Two menisci are located in each knee. They sit between the upper bone (femur) and lower bone (tibia) that make up the knee joint. Each meniscus acts as a shock absorber for the knee. A torn meniscus is one of the most common types of knee injuries. This injury can range from mild to severe. Surgery may be needed for a severe tear. What are the causes? This injury may be caused by any squatting, twisting, or pivoting movement. Sports-related injuries are the most common cause. These often occur from:  Running and stopping suddenly.  Changing direction.  Being tackled or knocked off your feet.  As people get older, their meniscus gets thinner and weaker. In these people, tears can happen more easily, such as from climbing stairs. What increases the risk? This injury is more likely to happen to:  People who play contact sports.  Males.  People who are 30?37 years of age.  What are the signs or symptoms? Symptoms of this injury include:  Knee pain, especially at the side of the knee joint. You may feel pain when the injury occurs, or you may only hear a pop and feel pain later.  A feeling that your knee is clicking, catching, locking, or giving way.  Not being able to fully bend or extend your knee.  Bruising or swelling in your knee.  How is this diagnosed? This injury may be diagnosed based on your symptoms and a physical exam. The physical exam may include:  Moving your knee in different ways.  Feeling for tenderness.  Listening for a clicking sound.  Checking if your knee locks or catches.  You may also have tests, such as:  X-rays.  MRI.  A procedure to look inside your knee with a narrow surgical telescope (arthroscopy).  You may be referred to a knee specialist (orthopedic surgeon). How is this  treated? Treatment for this injury depends on the severity of the tear. Treatment for a mild tear may include:  Rest.  Medicine to reduce pain and swelling. This is usually a nonsteroidal anti-inflammatory drug (NSAID).  A knee brace or an elastic sleeve or wrap.  Using crutches or a walker to keep weight off your knee and to help you walk.  Exercises to strengthen your knee (physical therapy).  You may need surgery if you have a severe tear or if other treatments are not working. Follow these instructions at home: Managing pain and swelling  Take over-the-counter and prescription medicines only as told by your health care provider.  If directed, apply ice to the injured area: ? Put ice in a plastic bag. ? Place a towel between your skin and the bag. ? Leave the ice on for 20 minutes, 2-3 times per day.  Raise (elevate) the injured area above the level of your heart while you are sitting or lying down. Activity  Do not use the injured limb to support your body weight until your health care provider says that you can. Use crutches or a walker as told by your health care provider.  Return to your normal activities as told by your health care provider. Ask your health care provider what activities are safe for you.  Perform range-of-motion exercises only as told by your health care provider.  Begin doing exercises to strengthen your knee and leg muscles only   as told by your health care provider. After you recover, your health care provider may recommend these exercises to help prevent another injury. General instructions  Use a knee brace or elastic wrap as told by your health care provider.  Keep all follow-up visits as told by your health care provider. This is important. Contact a health care provider if:  You have a fever.  Your knee becomes red, tender, or swollen.  Your pain medicine is not helping.  Your symptoms get worse or do not improve after 2 weeks of home  care. This information is not intended to replace advice given to you by your health care provider. Make sure you discuss any questions you have with your health care provider. Document Released: 11/26/2002 Document Revised: 02/11/2016 Document Reviewed: 12/29/2014 Elsevier Interactive Patient Education  2018 [employer]  

## 2017-04-13 ENCOUNTER — Encounter: Payer: Self-pay | Admitting: Orthopaedic Surgery

## 2017-04-13 ENCOUNTER — Ambulatory Visit (INDEPENDENT_AMBULATORY_CARE_PROVIDER_SITE_OTHER): Payer: BLUE CROSS/BLUE SHIELD | Admitting: Orthopaedic Surgery

## 2017-04-13 VITALS — BP 119/78 | HR 80 | Temp 97.7°F | Ht 70.0 in | Wt 147.0 lb

## 2017-04-13 DIAGNOSIS — M25561 Pain in right knee: Secondary | ICD-10-CM

## 2017-04-13 NOTE — Progress Notes (Signed)
Patient ZO:XWRUEAV:Jacob Eaton, male DOB:07/02/1981, 36 y.o. WUJ:[id number]  Chief Complaint  Patient presents with  . Follow-up    right knee pain    HPI  Jacob Eaton is a 36 y.o. male who is one month post injury to the right knee.  He is a little better but not much.  He has been using his knee sleeve. He has more pain as the day goes on. He has no giving way or locking.  He has swelling and popping.  He has to wait six weeks to get MRI per his insurance company.  He may have meniscus tear. HPI  Body mass index is 21.09 kg/m.  ROS  Review of Systems  HENT: Negative for congestion.   Respiratory: Negative for cough and shortness of breath.   Cardiovascular: Negative for chest pain and leg swelling.  Endocrine: Negative for cold intolerance.  Musculoskeletal: Positive for arthralgias, gait problem and joint swelling.  Allergic/Immunologic: Negative for environmental allergies.  Neurological: Positive for headaches.    Past Medical History:  Diagnosis Date  . Chronic tension headaches     Past Surgical History:  Procedure Laterality Date  . ESOPHAGEAL ATRESIA REPAIR  1990    Family History  Problem Relation Age of Onset  . Seizures Mother   . Heart disease Father   . Cancer Father   . Asthma Sister   . Colon cancer Neg Hx   . Colon polyps Neg Hx     Social History Social History  Substance Use Topics  . Smoking status: Former Smoker    Types: Cigarettes    Start date: 09/20/1999    Quit date: 09/19/2000  . Smokeless tobacco: Never Used  . Alcohol use Yes     Comment: rare    No Known Allergies  Current Outpatient Prescriptions  Medication Sig Dispense Refill  . naproxen (NAPROSYN) 500 MG tablet Take 1 tablet (500 mg total) by mouth 2 (two) times daily with a meal. 60 tablet 5  . pantoprazole (PROTONIX) 40 MG tablet Take 1 tablet (40 mg total) by mouth daily. Take 30 minutes before breakfast 90 tablet 3   No current facility-administered medications for this  visit.      Physical Exam  Blood pressure 119/78, pulse 80, temperature 97.7 F (36.5 C), height 5\' 10"  (1.778 m), weight 147 lb (66.7 kg).  Constitutional: overall normal hygiene, normal nutrition, well developed, normal grooming, normal body habitus. Assistive device: knee brace  Musculoskeletal: gait and station Limp right, muscle tone and strength are normal, no tremors or atrophy is present.  .  Neurological: coordination overall normal.  Deep tendon reflex/nerve stretch intact.  Sensation normal.  Cranial nerves II-XII intact.   Skin:   Normal overall no scars, lesions, ulcers or rashes. No psoriasis.  Psychiatric: Alert and oriented x 3.  Recent memory intact, remote memory unclear.  Normal mood and affect. Well groomed.  Good eye contact.  Cardiovascular: overall no swelling, no varicosities, no edema bilaterally, normal temperatures of the legs and arms, no clubbing, cyanosis and good capillary refill.  Lymphatic: palpation is normal.  The right lower extremity is examined:  Inspection:  Thigh:  Non-tender and no defects  Knee has swelling 1+ effusion.                        Joint tenderness is present  Patient is tender over the medial joint line  Lower Leg:  Has normal appearance and no tenderness or defects  Ankle:  Non-tender and no defects  Foot:  Non-tender and no defects Range of Motion:  Knee:  Range of motion is: 0-110                        Crepitus is  present  Ankle:  Range of motion is normal. Strength and Tone:  The right lower extremity has normal strength and tone. Stability:  Knee:  The knee has Social workermedal McMurray.  Ankle:  The ankle is stable.    The patient has been educated about the nature of the problem(s) and counseled on treatment options.  The patient appeared to understand what I have discussed and is in agreement with it.  Encounter Diagnosis  Name Primary?  . Acute pain of right knee Yes    PLAN Call if any  problems.  Precautions discussed.  Continue current medications.   Return to clinic 2 weeks   Consider MRI if not improved.  Continue knee brace.  Electronically Signed Darreld McleanWayne Constantine Ruddick, MD 7/26/20189:03 AM

## 2017-04-27 ENCOUNTER — Encounter: Payer: Self-pay | Admitting: Orthopaedic Surgery

## 2017-04-27 ENCOUNTER — Ambulatory Visit (INDEPENDENT_AMBULATORY_CARE_PROVIDER_SITE_OTHER): Payer: BLUE CROSS/BLUE SHIELD | Admitting: Orthopaedic Surgery

## 2017-04-27 VITALS — BP 124/84 | HR 77 | Temp 97.0°F | Ht 70.0 in | Wt 147.0 lb

## 2017-04-27 DIAGNOSIS — M25561 Pain in right knee: Secondary | ICD-10-CM | POA: Diagnosis not present

## 2017-04-27 NOTE — Progress Notes (Signed)
Patient ZO:XWRUEAV:Jacob Eaton, male DOB:12/03/1980, 36 y.o. [id number]:[id number]  Chief Complaint  Patient presents with  . Follow-up    Right knee    HPI  Jacob Eaton is a 36 y.o. male who has right knee pain that is not getting any better.  He has swelling, giving way and not improving.  He has had to wait six weeks before insurance can pay for MRI.  I am concerned he has medial meniscus tear.  He has not improved with rest, ice, heat, exercises, rubs.  He could not tolerate the Naprosyn.  He had to stop it. He got hives. HPI  Body mass index is 21.09 kg/m.  ROS  Review of Systems  HENT: Negative for congestion.   Respiratory: Negative for cough and shortness of breath.   Cardiovascular: Negative for chest pain and leg swelling.  Endocrine: Negative for cold intolerance.  Musculoskeletal: Positive for arthralgias, gait problem and joint swelling.  Allergic/Immunologic: Negative for environmental allergies.  Neurological: Positive for headaches.    Past Medical History:  Diagnosis Date  . Chronic tension headaches     Past Surgical History:  Procedure Laterality Date  . ESOPHAGEAL ATRESIA REPAIR  1990    Family History  Problem Relation Age of Onset  . Seizures Mother   . Heart disease Father   . Cancer Father   . Asthma Sister   . Colon cancer Neg Hx   . Colon polyps Neg Hx     Social History Social History  Substance Use Topics  . Smoking status: Former Smoker    Types: Cigarettes    Start date: 09/20/1999    Quit date: 09/19/2000  . Smokeless tobacco: Never Used  . Alcohol use Yes     Comment: rare    Allergies  Allergen Reactions  . Naproxen Hives    Current Outpatient Prescriptions  Medication Sig Dispense Refill  . naproxen (NAPROSYN) 500 MG tablet Take 1 tablet (500 mg total) by mouth 2 (two) times daily with a meal. 60 tablet 5  . pantoprazole (PROTONIX) 40 MG tablet Take 1 tablet (40 mg total) by mouth daily. Take 30 minutes before breakfast 90 tablet 3    No current facility-administered medications for this visit.      Physical Exam  Blood pressure 124/84, pulse 77, temperature (!) 97 F (36.1 C), height 5\' 10"  (1.778 m), weight 147 lb (66.7 kg).  Constitutional: overall normal hygiene, normal nutrition, well developed, normal grooming, normal body habitus. Assistive device:none  Musculoskeletal: gait and station Limp right, muscle tone and strength are normal, no tremors or atrophy is present.  .  Neurological: coordination overall normal.  Deep tendon reflex/nerve stretch intact.  Sensation normal.  Cranial nerves II-XII intact.   Skin:   Normal overall no scars, lesions, ulcers or rashes. No psoriasis.  Psychiatric: Alert and oriented x 3.  Recent memory intact, remote memory unclear.  Normal mood and affect. Well groomed.  Good eye contact.  Cardiovascular: overall no swelling, no varicosities, no edema bilaterally, normal temperatures of the legs and arms, no clubbing, cyanosis and good capillary refill.  Lymphatic: palpation is normal.  The right lower extremity is examined:  Inspection:  Thigh:  Non-tender and no defects  Knee has swelling 1/2+ effusion.                        Joint tenderness is present  Patient is tender over the medial joint line  Lower Leg:  Has normal appearance and no tenderness or defects  Ankle:  Non-tender and no defects  Foot:  Non-tender and no defects Range of Motion:  Knee:  Range of motion is: 0-110                        Crepitus is  present  Ankle:  Range of motion is normal. Strength and Tone:  The right lower extremity has normal strength and tone. Stability:  Knee:  The knee has positive medial McMurray.  Ankle:  The ankle is stable.    The patient has been educated about the nature of the problem(s) and counseled on treatment options.  The patient appeared to understand what I have discussed and is in agreement with it.  Encounter Diagnosis  Name  Primary?  . Acute pain of right knee Yes    PLAN Call if any problems.  Precautions discussed.  Continue current medications.   Return to clinic after MRI of the right knee   Electronically Signed Darreld Mclean, MD 8/9/20188:22 AM

## 2017-05-05 ENCOUNTER — Ambulatory Visit (HOSPITAL_COMMUNITY)
Admission: RE | Admit: 2017-05-05 | Discharge: 2017-05-05 | Disposition: A | Discharge status: Home / Self Care | Payer: BLUE CROSS/BLUE SHIELD | Source: Ambulatory Visit | Attending: Orthopaedic Surgery | Admitting: Orthopaedic Surgery

## 2017-05-05 DIAGNOSIS — M25561 Pain in right knee: Secondary | ICD-10-CM | POA: Insufficient documentation

## 2017-05-09 ENCOUNTER — Encounter: Payer: Self-pay | Admitting: Orthopaedic Surgery

## 2017-05-09 ENCOUNTER — Ambulatory Visit (INDEPENDENT_AMBULATORY_CARE_PROVIDER_SITE_OTHER): Payer: BLUE CROSS/BLUE SHIELD | Admitting: Orthopaedic Surgery

## 2017-05-09 VITALS — BP 123/83 | HR 82 | Temp 98.4°F | Ht 70.0 in | Wt 147.0 lb

## 2017-05-09 DIAGNOSIS — M25561 Pain in right knee: Secondary | ICD-10-CM

## 2017-05-09 NOTE — Progress Notes (Signed)
Patient Jacob Eaton, male DOB:[birth year], 36 y.o. [id number]  Chief Complaint  Patient presents with  . Follow-up    mri review right knee    HPI  Jacob Eaton is a 36 y.o. male who has had right knee pain and giving way.  He had a MRI which was normal.  He has no tear.  I have explained the findings to him.  He needs no surgery.  He will continue the knee sleeve. HPI  Body mass index is 21.09 kg/m.  ROS  Review of Systems  HENT: Negative for congestion.   Respiratory: Negative for cough and shortness of breath.   Cardiovascular: Negative for chest pain and leg swelling.  Endocrine: Negative for cold intolerance.  Musculoskeletal: Positive for arthralgias, gait problem and joint swelling.  Allergic/Immunologic: Negative for environmental allergies.  Neurological: Positive for headaches.    Past Medical History:  Diagnosis Date  . Chronic tension headaches     Past Surgical History:  Procedure Laterality Date  . ESOPHAGEAL ATRESIA REPAIR  1990    Family History  Problem Relation Age of Onset  . Seizures Mother   . Heart disease Father   . Cancer Father   . Asthma Sister   . Colon cancer Neg Hx   . Colon polyps Neg Hx     Social History Social History  Substance Use Topics  . Smoking status: Former Smoker    Types: Cigarettes    Start date: 09/20/1999    Quit date: 09/19/2000  . Smokeless tobacco: Never Used  . Alcohol use Yes     Comment: rare    Allergies  Allergen Reactions  . Naproxen Hives    Current Outpatient Prescriptions  Medication Sig Dispense Refill  . naproxen (NAPROSYN) 500 MG tablet Take 1 tablet (500 mg total) by mouth 2 (two) times daily with a meal. 60 tablet 5  . pantoprazole (PROTONIX) 40 MG tablet Take 1 tablet (40 mg total) by mouth daily. Take 30 minutes before breakfast 90 tablet 3   No current facility-administered medications for this visit.      Physical Exam  Blood pressure 123/83, pulse 82, temperature 98.4 F  (36.9 C), height 5\' 10"  (1.778 m), weight 147 lb (66.7 kg).  Constitutional: overall normal hygiene, normal nutrition, well developed, normal grooming, normal body habitus. Assistive device:patella tracker brace  Musculoskeletal: gait and station Limp right, muscle tone and strength are normal, no tremors or atrophy is present.  .  Neurological: coordination overall normal.  Deep tendon reflex/nerve stretch intact.  Sensation normal.  Cranial nerves II-XII intact.   Skin:   Normal overall no scars, lesions, ulcers or rashes. No psoriasis.  Psychiatric: Alert and oriented x 3.  Recent memory intact, remote memory unclear.  Normal mood and affect. Well groomed.  Good eye contact.  Cardiovascular: overall no swelling, no varicosities, no edema bilaterally, normal temperatures of the legs and arms, no clubbing, cyanosis and good capillary refill.  Lymphatic: palpation is normal.  His right knee has just slight effusion and ROM is full.  He has medial joint line pain.  NV intact.  Knee is stable.  The patient has been educated about the nature of the problem(s) and counseled on treatment options.  The patient appeared to understand what I have discussed and is in agreement with it.  Encounter Diagnosis  Name Primary?  . Acute pain of right knee Yes    PLAN Call if any problems.  Precautions discussed.  Continue current medications.  Return to clinic 1 month   Electronically Signed Darreld Mclean, MD 8/21/20189:44 AM

## 2017-05-31 ENCOUNTER — Encounter: Payer: Self-pay | Admitting: Gastroenterology

## 2017-05-31 ENCOUNTER — Other Ambulatory Visit: Payer: Self-pay

## 2017-05-31 ENCOUNTER — Ambulatory Visit (INDEPENDENT_AMBULATORY_CARE_PROVIDER_SITE_OTHER): Payer: BLUE CROSS/BLUE SHIELD | Admitting: Gastroenterology

## 2017-05-31 VITALS — BP 119/81 | HR 81 | Temp 97.3°F | Ht 71.0 in | Wt 148.4 lb

## 2017-05-31 DIAGNOSIS — K760 Fatty (change of) liver, not elsewhere classified: Secondary | ICD-10-CM

## 2017-05-31 DIAGNOSIS — R748 Abnormal levels of other serum enzymes: Secondary | ICD-10-CM

## 2017-05-31 DIAGNOSIS — K219 Gastro-esophageal reflux disease without esophagitis: Secondary | ICD-10-CM

## 2017-05-31 MED ORDER — PANTOPRAZOLE SODIUM 40 MG PO TBEC: 40.0000 mg | DELAYED_RELEASE_TABLET | Freq: Every day | ORAL | 3 refills | Status: DC

## 2017-05-31 NOTE — Progress Notes (Signed)
cc'ed to pcp °

## 2017-05-31 NOTE — Assessment & Plan Note (Signed)
History of RUQ pain s/p thorough evaluation. Doing well with GERD symptoms on Protonix once daily. Refills provided. Return in 6 months.

## 2017-05-31 NOTE — Assessment & Plan Note (Signed)
Chronic isolated elevation. Imaging on file with fatty liver. AMA, ANA, ASMA normal with GGT elevated. Declining recheck of HFP due to finances. Will arrange this in Jan 2019. If persistently elevated (as I see there was a bump in April), would consider liver biopsy. Monitor for now. As of note, patient concerned about multiple second-degree relatives with cancer of multiple types (no GI cancers) and requesting "blood work to check this". I will order just routine CBC, CMP, TSH in Jan 2019 and discussed we would not routinely check certain tumor markers unless there was a need for this. Otherwise, follow routine screening recommendations per guidelines.

## 2017-05-31 NOTE — Patient Instructions (Signed)
We will send blood work to have you complete in January 2019.  I have refilled the Protonix for a year.   We will see you in 6 months!  Stay safe this weekend!

## 2017-05-31 NOTE — Progress Notes (Signed)
Referring Provider: Chipper Herb, MD Primary Care Physician:  Sharion Balloon, FNP Primary GI: Dr. Oneida Alar   Chief Complaint  Patient presents with  . Abdominal Pain    f/u, doing ok; needs Protonix refill    HPI:   Jacob Eaton is a 36 y.o. male presenting today with a history of GERD on PPI. History of RUQ pain s/p thorough evaluation with [place], HIDA, and MRI due to ?biliary ductal dilatation. Chronic isolated elevation of alk phos, with GGT elevated, AMA, ANA, ASMA all normal. Due for labs but wants to hold off due to finances.   Requesting blood work to "check for cancer". Multiple second-degree relatives on both sides with different cancers (no colon or stomach). . Dad had brain tumor. Headaches once a week. Hits suddenly. No prodromal symptoms. Will last all day. Applying pressure to his head (typing a rag around his head) helps. Aleve sometimes works, sometimes doesn't. Works in a loud environment. Evaluation in 2012 for headaches.   From a GI standpoint, doing well on Protonix. No dysphagia. Good appetite. Weight trends reviewed and overall stable.   Past Medical History:  Diagnosis Date  . Chronic tension headaches     Past Surgical History:  Procedure Laterality Date  . ESOPHAGEAL ATRESIA REPAIR  1990    Current Outpatient Prescriptions  Medication Sig Dispense Refill  . ibuprofen (ADVIL,MOTRIN) 200 MG tablet Take 400 mg by mouth every 6 (six) hours as needed.    . naproxen sodium (ANAPROX) 220 MG tablet Take 440 mg by mouth 2 (two) times daily with a meal.    . pantoprazole (PROTONIX) 40 MG tablet Take 1 tablet (40 mg total) by mouth daily. Take 30 minutes before breakfast 90 tablet 3  . naproxen (NAPROSYN) 500 MG tablet Take 1 tablet (500 mg total) by mouth 2 (two) times daily with a meal. (Patient not taking: Reported on 05/31/2017) 60 tablet 5   No current facility-administered medications for this visit.     Allergies as of 05/31/2017 - Review Complete  05/31/2017  Allergen Reaction Noted  . Naproxen Hives 04/27/2017    Family History  Problem Relation Age of Onset  . Seizures Mother   . Heart disease Father   . Cancer Father   . Asthma Sister   . Colon cancer Neg Hx   . Colon polyps Neg Hx     Social History   Social History  . Marital status: Single    Spouse name: N/A  . Number of children: N/A  . Years of education: N/A   Social History Main Topics  . Smoking status: Former Smoker    Types: Cigarettes    Start date: 09/20/1999    Quit date: 09/19/2000  . Smokeless tobacco: Never Used  . Alcohol use No  . Drug use: No  . Sexual activity: Not Asked   Other Topics Concern  . None   Social History Narrative  . None    Review of Systems: Gen:see HPI  CV: Denies chest pain, palpitations, syncope, peripheral edema, and claudication. Resp: Denies dyspnea at rest, cough, wheezing, coughing up blood, and pleurisy. GI: see HPI  Derm: Denies rash, itching, dry skin Psych: Denies depression, anxiety, memory loss, confusion. No homicidal or suicidal ideation.  Heme: Denies bruising, bleeding, and enlarged lymph nodes.  Physical Exam: BP 119/81   Pulse 81   Temp (!) 97.3 F (36.3 C) (Oral)   Ht 5' 11"  (1.803 m)   Wt 148 lb 6.4  oz (67.3 kg)   BMI 20.70 kg/m  General:   Alert and oriented. No distress noted. Pleasant and cooperative.  Head:  Normocephalic and atraumatic. Eyes:  Conjuctiva clear without scleral icterus. Mouth:  Oral mucosa pink and moist. Edentulous Abdomen:  +BS, soft, non-tender and non-distended. No rebound or guarding. No HSM or masses noted.  Msk:  Symmetrical without gross deformities. Normal posture. Extremities:  Without edema. Neurologic:  Alert and  oriented x4 Psych:  Alert and cooperative. Normal mood and affect.  Lab Results  Component Value Date   ALT 55 (H) 01/04/2017   AST 36 01/04/2017   ALKPHOS 284 (H) 01/04/2017   BILITOT 0.5 01/04/2017   Lab Results  Component Value Date     WBC 4.6 01/04/2017   HGB 15.0 01/04/2017   HCT 44.6 01/04/2017   MCV 95 01/04/2017   PLT 240 01/04/2017   Lab Results  Component Value Date   TSH 2.050 07/26/2013

## 2017-06-06 ENCOUNTER — Ambulatory Visit: Payer: BLUE CROSS/BLUE SHIELD | Admitting: Orthopaedic Surgery

## 2017-08-24 ENCOUNTER — Other Ambulatory Visit: Payer: Self-pay

## 2017-08-24 DIAGNOSIS — K219 Gastro-esophageal reflux disease without esophagitis: Secondary | ICD-10-CM

## 2017-08-24 DIAGNOSIS — R748 Abnormal levels of other serum enzymes: Secondary | ICD-10-CM

## 2017-08-24 DIAGNOSIS — K760 Fatty (change of) liver, not elsewhere classified: Secondary | ICD-10-CM

## 2017-09-28 IMAGING — NM NM HEPATOBILIARY IMAGE, INC GB
3 series · 18 of 18 positions shown · non-contrast
Comparison: None.

CLINICAL DATA: RIGHT upper quadrant abdominal pain for 2 months

EXAM:
NUCLEAR MEDICINE HEPATOBILIARY IMAGING WITH GALLBLADDER EF
TECHNIQUE: Sequential images of the abdomen were obtained [DATE] minutes
following intravenous administration of radiopharmaceutical. After
oral ingestion of Ensure, gallbladder ejection fraction was
determined. At 60 min, normal ejection fraction is greater than 33%.
RADIOPHARMACEUTICALS:  5.5 mCi Zc-88m  Choletec IV

[Series 1: biliary · 3.25mm/px · 6 of 60 frames shown (1 of 2)]
[frame 6/60]
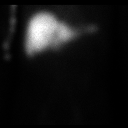
[frame 16/60]
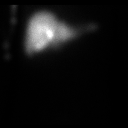
[frame 26/60]
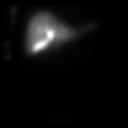
[frame 36/60]
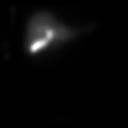
[frame 46/60]
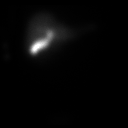
[frame 56/60]
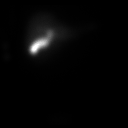

[Series 2: biliary · 3.25mm/px · 6 of 30 frames shown (2 of 2)]
[frame 3/30]
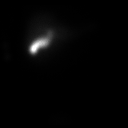
[frame 8/30]
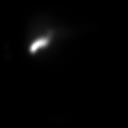
[frame 13/30]
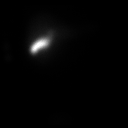
[frame 18/30]
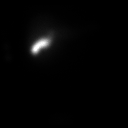
[frame 23/30]
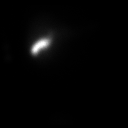
[frame 28/30]
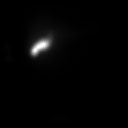

[Series 3: gbef · 3.25mm/px · 6 of 60 frames shown]
[frame 6/60]
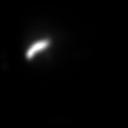
[frame 16/60]
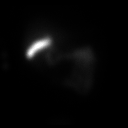
[frame 26/60]
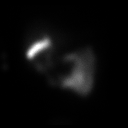
[frame 36/60]
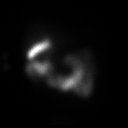
[frame 46/60]
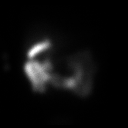
[frame 56/60]
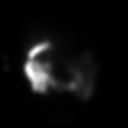

[18 of 18 positions shown; findings below may reference images not displayed]

FINDINGS: Normal tracer extraction from bloodstream indicating normal
hepatocellular function.

Normal excretion of tracer into biliary tree.

Gallbladder visualized at 17 min.

Small bowel not visualized until following fatty meal stimulation.

Somewhat prolonged hepatic retention of tracer which cleared
following fatty meal stimulation.

Subjectively normal emptying of tracer from gallbladder following
fatty meal stimulation.

Calculated gallbladder ejection fraction is 66%, normal.

Patient reported mild abdominal pain following Ensure ingestion.

Normal gallbladder ejection fraction following Ensure ingestion is
greater than 33% at 1 hour.
IMPRESSION: Patent biliary tree.

Normal gallbladder response to fatty meal stimulation with a normal
gallbladder ejection fraction of 66%.

Patient reported mild abdominal pain following Ensure.

## 2017-11-29 ENCOUNTER — Ambulatory Visit (INDEPENDENT_AMBULATORY_CARE_PROVIDER_SITE_OTHER): Payer: BLUE CROSS/BLUE SHIELD | Admitting: Gastroenterology

## 2017-11-29 ENCOUNTER — Encounter: Payer: Self-pay | Admitting: Gastroenterology

## 2017-11-29 VITALS — BP 120/73 | HR 77 | Temp 97.3°F | Ht 71.0 in | Wt 150.6 lb

## 2017-11-29 DIAGNOSIS — R748 Abnormal levels of other serum enzymes: Secondary | ICD-10-CM | POA: Diagnosis not present

## 2017-11-29 DIAGNOSIS — K6289 Other specified diseases of anus and rectum: Secondary | ICD-10-CM

## 2017-11-29 MED ORDER — HYDROCORTISONE 2.5 % RE CREA: 1.0000 "application " | TOPICAL_CREAM | Freq: Two times a day (BID) | RECTAL | 1 refills | Status: DC

## 2017-11-29 MED ORDER — NITROGLYCERIN 0.4 % RE OINT: 1.0000 [in_us] | TOPICAL_OINTMENT | Freq: Two times a day (BID) | RECTAL | 1 refills | Status: DC

## 2017-11-29 NOTE — Assessment & Plan Note (Signed)
New onset rectal discomfort with scant bleeding, in setting of constipation. Query occult anal fissure vs hemorrhoid related. He notes life long history of once weekly BMs, but he denies feeling uncomfortable. We discussed avoidance of constipation and increasing frequency of bowel habits to avoid hard stools, straining (although he denies this). Recommend colonoscopy in near future due to low-volume hematochezia; I discussed even though I feel this is a benign anorectal source, we still need to exclude any other occult etiology.  Anusol BID Nitro ointment BID per rectum Trial of Linzess 72 mcg for a week, then may increase to 145 if necessary Return in 6-8 weeks to discuss colonoscopy, as he wants to hold off currently

## 2017-11-29 NOTE — Progress Notes (Signed)
cc'ed to pcp °

## 2017-11-29 NOTE — Assessment & Plan Note (Signed)
Chronically elevated alk phos in setting of fatty liver. AMA, ANA, ASMA normal with GGT elevation. MRI on file without abnormalities. Needs HFP now. Would recommend liver biopsy if persistently elevated or bump in labs.

## 2017-11-29 NOTE — Patient Instructions (Signed)
Please have blood work done when you are able.   I have given you two samples of a medication to help with bowel movements. Start taking the lower dosage, Linzess 72 micrograms, once each morning, 30 minutes before breakfast. You may have loose stool starting off, but it should get better. If you don't have good results, then try the higher dosage samples of Linzess 145 micrograms.  For the rectal discomfort: use the anusol cream twice a day per rectum. Alternate this cream with the nitroglycerin twice a day per rectum for 2-4 weeks. Wear gloves when using the nitroglycerin, as it can cause headaches. Let me know if the copay is too expensive for the nitroglycerin or there are any issues.  We will see you in 6-8 weeks! I recommend a colonoscopy in the future, and we can talk about it then.  It was a pleasure to see you today. I strive to create trusting relationships with patients to provide genuine, compassionate, and quality care. I value your feedback. If you receive a survey regarding your visit,  I greatly appreciate you taking time to fill this out.   Gelene MinkAnna W. Nathan Moctezuma, PhD, ANP-BC [facility]

## 2017-11-29 NOTE — Progress Notes (Signed)
Primary Care Physician:  Sharion Balloon, FNP Primary GI: Dr. Oneida Alar   Chief Complaint  Patient presents with  . Abdominal Pain  . rectal discomfort    feels like needles when has bm  . Rectal Bleeding    small amt-pink    HPI:   Jacob Eaton is a 37 y.o. male presenting today with a history of GERD on PPI. History of RUQ pain s/p thorough evaluation with [place], HIDA, and MRI due to ?biliary ductal dilatation. Chronic isolated elevation of alk phos, with GGT elevated, AMA, ANA, ASMA all normal. Due for labs but wants to hold off due to finances.   Sometimes vague right sided pain. Last month, started having rectal discomfort with bowel movements. Feels like needles when passing stool. No straining. Small amount of blood with it. Denies chronic constipation; however, he states he only has a BM once per week. States this has been life long. Denies feeling uncomfortable the other days of the week. Stool is not hard. Denies anal trauma.   Takes Aleve and Excedrin migraines. Wants to hold off on colonoscopy right now but wants to discuss at next visit.   Past Medical History:  Diagnosis Date  . Chronic tension headaches     Past Surgical History:  Procedure Laterality Date  . ESOPHAGEAL ATRESIA REPAIR  1990    Current Outpatient Medications  Medication Sig Dispense Refill  . ibuprofen (ADVIL,MOTRIN) 200 MG tablet Take 400 mg by mouth every 6 (six) hours as needed.    . naproxen sodium (ANAPROX) 220 MG tablet Take 440 mg by mouth 2 (two) times daily with a meal.    . pantoprazole (PROTONIX) 40 MG tablet Take 1 tablet (40 mg total) by mouth daily. Take 30 minutes before breakfast 90 tablet 3  . hydrocortisone (ANUSOL-HC) 2.5 % rectal cream Place 1 application rectally 2 (two) times daily. 30 g 1  . Nitroglycerin 0.4 % OINT Place 1 inch rectally 2 (two) times daily. 2-4 weeks 1 Tube 1   No current facility-administered medications for this visit.     Allergies as of 11/29/2017 -  Review Complete 11/29/2017  Allergen Reaction Noted  . Naproxen Hives 04/27/2017    Family History  Problem Relation Age of Onset  . Seizures Mother   . Heart disease Father   . Cancer Father   . Asthma Sister   . Colon cancer Neg Hx   . Colon polyps Neg Hx     Social History   Socioeconomic History  . Marital status: Single    Spouse name: None  . Number of children: None  . Years of education: None  . Highest education level: None  Social Needs  . Financial resource strain: None  . Food insecurity - worry: None  . Food insecurity - inability: None  . Transportation needs - medical: None  . Transportation needs - non-medical: None  Occupational History  . None  Tobacco Use  . Smoking status: Current Some Day Smoker    Types: Cigarettes    Start date: 09/20/1999    Last attempt to quit: 09/19/2000    Years since quitting: 17.2  . Smokeless tobacco: Never Used  Substance and Sexual Activity  . Alcohol use: Yes    Comment:  'once in a blue moon"  . Drug use: No  . Sexual activity: None  Other Topics Concern  . None  Social History Narrative  . None    Review of Systems: Gen: Denies fever,  chills, anorexia. Denies fatigue, weakness, weight loss.  CV: Denies chest pain, palpitations, syncope, peripheral edema, and claudication. Resp: Denies dyspnea at rest, cough, wheezing, coughing up blood, and pleurisy. GI: see HPI  Derm: Denies rash, itching, dry skin Psych: Denies depression, anxiety, memory loss, confusion. No homicidal or suicidal ideation.  Heme:see HPI   Physical Exam: BP 120/73   Pulse 77   Temp (!) 97.3 F (36.3 C) (Oral)   Ht 5' 11"  (1.803 m)   Wt 150 lb 9.6 oz (68.3 kg)   BMI 21.00 kg/m  General:   Alert and oriented. No distress noted. Pleasant and cooperative.  Head:  Normocephalic and atraumatic. Eyes:  Conjuctiva clear without scleral icterus. Mouth:  Oral mucosa pink and moist.  Abdomen:  +BS, soft, non-tender and non-distended. No  rebound or guarding. No HSM or masses noted. Rectal: no obvious external hemorrhoids or obvious anal fissure. Internal exam with discomfort, tight anal sphincter and difficult to assess due to patient unable to relax.  Msk:  Symmetrical without gross deformities. Normal posture. Extremities:  Without edema. Neurologic:  Alert and  oriented x4 Psych:  Alert and cooperative. Normal mood and affect.

## 2017-12-05 DIAGNOSIS — R748 Abnormal levels of other serum enzymes: Secondary | ICD-10-CM | POA: Diagnosis not present

## 2017-12-05 LAB — HEPATIC FUNCTION PANEL
AG RATIO: 2 (calc) (ref 1.0–2.5)
ALBUMIN MSPROF: 4.8 g/dL (ref 3.6–5.1)
ALT: 82 U/L — AB (ref 9–46)
AST: 44 U/L — ABNORMAL HIGH (ref 10–40)
Alkaline phosphatase (APISO): 235 U/L — ABNORMAL HIGH (ref 40–115)
BILIRUBIN TOTAL: 0.8 mg/dL (ref 0.2–1.2)
Bilirubin, Direct: 0.1 mg/dL (ref 0.0–0.2)
Globulin: 2.4 g/dL (calc) (ref 1.9–3.7)
Indirect Bilirubin: 0.7 mg/dL (calc) (ref 0.2–1.2)
Total Protein: 7.2 g/dL (ref 6.1–8.1)

## 2017-12-11 ENCOUNTER — Other Ambulatory Visit: Payer: Self-pay

## 2017-12-11 DIAGNOSIS — R748 Abnormal levels of other serum enzymes: Secondary | ICD-10-CM

## 2017-12-11 NOTE — Progress Notes (Signed)
Transaminases increasing, alk phos trending up. Prior AMA, ANA, ASMA negative. We need to check Hep C antibody, Hep B surface antigen, Hep B total antibody and Hep B core antibody. Needs ceruloplasmin as well. Update complete US abdomen, need to assess liver, gallbladder, and spleen.

## 2017-12-12 NOTE — Progress Notes (Signed)
Left message with pt's mom to call for results.

## 2017-12-13 ENCOUNTER — Other Ambulatory Visit: Payer: Self-pay

## 2017-12-13 ENCOUNTER — Encounter: Payer: Self-pay | Admitting: *Deleted

## 2017-12-13 ENCOUNTER — Other Ambulatory Visit: Payer: Self-pay | Admitting: *Deleted

## 2017-12-13 DIAGNOSIS — R748 Abnormal levels of other serum enzymes: Secondary | ICD-10-CM

## 2017-12-13 NOTE — Progress Notes (Signed)
PT is aware. He knows to go to the lab.  Ok to schedule US. He said he is on vacation next week and any day next week would be OK.

## 2017-12-18 ENCOUNTER — Ambulatory Visit (INDEPENDENT_AMBULATORY_CARE_PROVIDER_SITE_OTHER): Payer: BLUE CROSS/BLUE SHIELD | Admitting: Pediatrics

## 2017-12-18 ENCOUNTER — Encounter: Payer: Self-pay | Admitting: Pediatrics

## 2017-12-18 VITALS — BP 109/76 | HR 79 | Temp 97.9°F | Ht 71.0 in | Wt 148.0 lb

## 2017-12-18 DIAGNOSIS — G43809 Other migraine, not intractable, without status migrainosus: Secondary | ICD-10-CM | POA: Diagnosis not present

## 2017-12-18 MED ORDER — RIZATRIPTAN BENZOATE 10 MG PO TABS: 10.0000 mg | ORAL_TABLET | ORAL | 1 refills | Status: DC | PRN

## 2017-12-18 MED ORDER — AMITRIPTYLINE HCL 10 MG PO TABS: 10.0000 mg | ORAL_TABLET | Freq: Every day | ORAL | 2 refills | Status: DC

## 2017-12-18 NOTE — Progress Notes (Signed)
Subjective:   Patient ID: Jacob CoriaJeffrey Squier, male    DOB: 09/15/1981, 37 y.o.   MRN: [id number] CC: Migraine (3-4 times weekly)  HPI: Jacob Eaton is a 37 y.o. male presenting for Migraine (3-4 times weekly) Has a headache about every other day. Thinks worse with stress. Gets stresed at work.  Works at [employer].  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  Headache starts back of the left side of his head.  Goes up to the top of his head and then all around.  Sometimes starts in the morning, sometimes starts in the evening or afternoon.  Sometimes starts at work.  Last for several hours.  Light and sound make it worse.  Sometimes feels nauseous with it. Taking excedrin migraine, aleve, doesn't help.   Last seen for headaches by different doctor in 2012.  At that time he had a CT of his head that was normal, visible through care everywhere.  Headaches are about the same as then though he thinks is slightly more frequent than before.  Work is very noisy.  He goes to sleep between 10 and 3 AM.  He wakes up at 5 on days he has to work.  He sleeps until 8 or 9 on days he does not work.  He drinks 2 sodas at work, 2 Gatorade's, 2 bottles of water as well.  He might drink 1 or 2 more glasses of soda with caffeine and after work.  Rare alcohol use, not every week.  Relevant past medical, surgical, family and social history reviewed. Allergies and medications reviewed and updated. Social History   Tobacco Use  Smoking Status Current Some Day Smoker  . Types: Cigarettes  . Start date: 09/20/1999  . Last attempt to quit: 09/19/2000  . Years since quitting: 17.2  Smokeless Tobacco Never Used   ROS: Per HPI   Objective:    BP 109/76   Pulse 79   Temp 97.9 F (36.6 C) (Oral)   Ht 5\' 11"  (1.803 m)   Wt 148 lb (67.1 kg)   BMI 20.64 kg/m   Wt Readings from Last 3 Encounters:  12/18/17 148 lb (67.1 kg)  11/29/17 150 lb 9.6 oz (68.3 kg)  05/31/17 148 lb 6.4 oz (67.3  kg)    Gen: NAD, alert, cooperative with exam, NCAT EYES: EOMI, no conjunctival injection, or no icterus ENT:  TMs pearly gray b/l, clear effusion present behind left TM, OP without erythema LYMPH: no cervical LAD CV: NRRR, normal S1/S2, no murmur, distal pulses 2+ b/l Resp: CTABL, no wheezes, normal WOB Abd: +BS, soft, NTND. no guarding or organomegaly Ext: No edema, warm Neuro: Alert and oriented, strength equal b/l UE and LE, patellar reflex 1+ bilaterally.  Coordination grossly normal, rapid alternating movements intact.  Negative Romberg. MSK: deformity L pinky  Assessment & Plan:  [name] was seen today for migraine.  Diagnoses and all orders for this visit:  Other migraine without status migrainosus, not intractable Tried OTC without improvement.  Discussed sleep hygiene.  Start amitriptyline every night.  Take Maxalt as needed for migraine.  Return to clinic in 4 weeks.  Keep a journal of headaches, bring to next visit. -     amitriptyline (ELAVIL) 10 MG tablet; Take 1 tablet (10 mg total) by mouth at bedtime. -     rizatriptan (MAXALT) 10 MG tablet; Take 1 tablet (10 mg total) by mouth as needed for migraine. May repeat in 2 hours if needed  Follow up plan: Return in about 1 month (around 01/15/2018). Rex Kras, MD [place]

## 2017-12-18 NOTE — Patient Instructions (Signed)
Take amitriptyline 10 mg every night before bed to try to help prevent headaches.  Try to get at least 8 hours of sleep every night.  Turn TV off an hour before going to bed.  No caffeine after 12 PM.  Take rizatriptan 10 mg at the start of a headache.  This medicine may cause sleepiness.  Do not take and drive.

## 2017-12-19 ENCOUNTER — Ambulatory Visit (HOSPITAL_COMMUNITY)
Admission: RE | Admit: 2017-12-19 | Discharge: 2017-12-19 | Disposition: A | Discharge status: Home / Self Care | Payer: BLUE CROSS/BLUE SHIELD | Source: Ambulatory Visit | Attending: Gastroenterology | Admitting: Gastroenterology

## 2017-12-19 DIAGNOSIS — R748 Abnormal levels of other serum enzymes: Secondary | ICD-10-CM | POA: Diagnosis not present

## 2017-12-20 LAB — HEPATITIS C ANTIBODY
Hepatitis C Ab: NONREACTIVE
SIGNAL TO CUT-OFF: 0.05 (ref <1.00)

## 2017-12-20 LAB — HEPATITIS B SURFACE ANTIBODY,QUALITATIVE: HEP B S AB: NONREACTIVE

## 2017-12-20 LAB — CERULOPLASMIN: Ceruloplasmin: 29 mg/dL (ref 18–36)

## 2017-12-20 LAB — HEPATITIS B CORE ANTIBODY, TOTAL: Hep B Core Total Ab: NONREACTIVE

## 2017-12-20 LAB — HEPATITIS B SURFACE ANTIGEN: HEP B S AG: NONREACTIVE

## 2017-12-25 NOTE — Progress Notes (Signed)
Addendum: US does not say anything about a fatty liver. He has been evaluated in past with MRI as well. Recommend liver biopsy.

## 2017-12-25 NOTE — Progress Notes (Signed)
Hep C negative. Needs Hep B immunization. Normal ceruloplasmin. US with fatty liver. With his enzymes increasing, I recommend liver biopsy if he is willing.

## 2017-12-26 ENCOUNTER — Other Ambulatory Visit: Payer: Self-pay | Admitting: *Deleted

## 2017-12-26 ENCOUNTER — Telehealth: Payer: Self-pay

## 2017-12-26 DIAGNOSIS — R748 Abnormal levels of other serum enzymes: Secondary | ICD-10-CM

## 2017-12-26 NOTE — Telephone Encounter (Signed)
Opened in error

## 2017-12-26 NOTE — Progress Notes (Signed)
LMOM to call.

## 2017-12-26 NOTE — Progress Notes (Signed)
Pt is aware. OK to schedule the liver biopsy. The Linzess 72 mcg worked well. [facility].

## 2017-12-27 ENCOUNTER — Other Ambulatory Visit: Payer: Self-pay | Admitting: Gastroenterology

## 2017-12-27 MED ORDER — LINACLOTIDE 72 MCG PO CAPS: 72.0000 ug | ORAL_CAPSULE | Freq: Every day | ORAL | 3 refills | Status: DC

## 2017-12-27 NOTE — Progress Notes (Signed)
Linzess to pharmacy.

## 2017-12-27 NOTE — Progress Notes (Signed)
Tobi Bastosnna, please send in the Linzess 72 mcg Rx to the pharmacy. Thanks!

## 2017-12-27 NOTE — Progress Notes (Signed)
LMOM that Rx was sent in.  

## 2018-01-04 ENCOUNTER — Other Ambulatory Visit: Payer: Self-pay | Admitting: Radiology

## 2018-01-08 ENCOUNTER — Other Ambulatory Visit (HOSPITAL_COMMUNITY): Payer: Self-pay | Admitting: Interventional Radiology

## 2018-01-08 ENCOUNTER — Other Ambulatory Visit: Payer: Self-pay | Admitting: Family Medicine

## 2018-01-08 ENCOUNTER — Ambulatory Visit (HOSPITAL_COMMUNITY)
Admission: RE | Admit: 2018-01-08 | Discharge: 2018-01-08 | Disposition: A | Discharge status: Home / Self Care | Payer: BLUE CROSS/BLUE SHIELD | Source: Ambulatory Visit | Attending: Gastroenterology | Admitting: Gastroenterology

## 2018-01-08 DIAGNOSIS — Z8249 Family history of ischemic heart disease and other diseases of the circulatory system: Secondary | ICD-10-CM | POA: Insufficient documentation

## 2018-01-08 DIAGNOSIS — R748 Abnormal levels of other serum enzymes: Secondary | ICD-10-CM | POA: Insufficient documentation

## 2018-01-08 DIAGNOSIS — Z825 Family history of asthma and other chronic lower respiratory diseases: Secondary | ICD-10-CM | POA: Diagnosis not present

## 2018-01-08 DIAGNOSIS — F17210 Nicotine dependence, cigarettes, uncomplicated: Secondary | ICD-10-CM | POA: Diagnosis not present

## 2018-01-08 DIAGNOSIS — Z886 Allergy status to analgesic agent status: Secondary | ICD-10-CM | POA: Insufficient documentation

## 2018-01-08 DIAGNOSIS — Z79899 Other long term (current) drug therapy: Secondary | ICD-10-CM | POA: Diagnosis not present

## 2018-01-08 LAB — CBC
HCT: 44.1 % (ref 39.0–52.0)
Hemoglobin: 15.3 g/dL (ref 13.0–17.0)
MCH: 32.8 pg (ref 26.0–34.0)
MCHC: 34.7 g/dL (ref 30.0–36.0)
MCV: 94.6 fL (ref 78.0–100.0)
Platelets: 196 10*3/uL (ref 150–400)
RBC: 4.66 MIL/uL (ref 4.22–5.81)
RDW: 12.9 % (ref 11.5–15.5)
WBC: 5.5 10*3/uL (ref 4.0–10.5)

## 2018-01-08 LAB — PROTIME-INR
INR: 0.92
Prothrombin Time: 12.3 seconds (ref 11.4–15.2)

## 2018-01-08 MED ORDER — FENTANYL CITRATE (PF) 100 MCG/2ML IJ SOLN
INTRAMUSCULAR | Status: AC
  Filled 2018-01-08: qty 2

## 2018-01-08 MED ORDER — SODIUM CHLORIDE 0.9 % IV SOLN
INTRAVENOUS | Status: AC | PRN
  Administered 2018-01-08: 10 mL/h via INTRAVENOUS

## 2018-01-08 MED ORDER — LIDOCAINE HCL (PF) 1 % IJ SOLN
INTRAMUSCULAR | Status: AC
  Filled 2018-01-08: qty 30

## 2018-01-08 MED ORDER — SODIUM CHLORIDE 0.9 % IV SOLN: INTRAVENOUS | Status: DC

## 2018-01-08 MED ORDER — FENTANYL CITRATE (PF) 100 MCG/2ML IJ SOLN
INTRAMUSCULAR | Status: AC | PRN
  Administered 2018-01-08: 50 ug via INTRAVENOUS

## 2018-01-08 MED ORDER — MIDAZOLAM HCL 2 MG/2ML IJ SOLN
INTRAMUSCULAR | Status: AC | PRN
  Administered 2018-01-08: 1 mg via INTRAVENOUS

## 2018-01-08 MED ORDER — GELATIN ABSORBABLE 12-7 MM EX MISC
CUTANEOUS | Status: AC
  Filled 2018-01-08: qty 1

## 2018-01-08 MED ORDER — MIDAZOLAM HCL 2 MG/2ML IJ SOLN
INTRAMUSCULAR | Status: AC
  Filled 2018-01-08: qty 2

## 2018-01-08 NOTE — Sedation Documentation (Signed)
Patient is resting comfortably. 

## 2018-01-08 NOTE — Procedures (Signed)
inc'd LFTs  S/p US rt hepatic liver core bx  No comp Stable EBL min Path pending Full report in pacs

## 2018-01-08 NOTE — H&P (Signed)
Chief Complaint: Patient was seen in consultation today for chronic alkaline phosphatase elevation.  Referring Physician(s): Gelene Mink  Supervising Physician: Ruel Favors  Patient Status: [facility] - Out-pt  History of Present Illness: Jacob Eaton is a 37 y.o. male  Hx chronic alkaline phosphatase elevation in setting of fatty liver.  Hepatic function panel 12/05/2017: Alkaline phosphatase: 235 U/L AST: 44 U/L ALT: 82 U/L  US abdomen 12/19/2017: 1. Nonvisualization of pancreas due to bowel gas. 2. Prominent CBD for age at 7 mm diameter though no associated intrahepatic biliary dilatation is identified. 3. Remainder of exam unremarkable.  IR consulted by Lewie Loron, NP for possible image-guided liver biopsy. Patient presents today with no complaints. Denies fever, N/V, and abdominal pain.  Past Medical History:  Diagnosis Date  . Chronic tension headaches     Past Surgical History:  Procedure Laterality Date  . ESOPHAGEAL ATRESIA REPAIR  1990    Allergies: Naproxen  Medications: Prior to Admission medications   Medication Sig Start Date End Date Taking? Authorizing Provider  amitriptyline (ELAVIL) 10 MG tablet Take 1 tablet (10 mg total) by mouth at bedtime. 12/18/17  Yes Johna Sheriff, MD  hydrocortisone (ANUSOL-HC) 2.5 % rectal cream Place 1 application rectally 2 (two) times daily. 11/29/17  Yes Gelene Mink, NP  ibuprofen (ADVIL,MOTRIN) 200 MG tablet Take 400 mg by mouth every 6 (six) hours as needed.    Yes [provider]  linaclotide Karlene Einstein) 72 MCG capsule Take 1 capsule (72 mcg total) by mouth daily before breakfast. 12/27/17  Yes Gelene Mink, NP  Nitroglycerin 0.4 % OINT Place 1 inch rectally 2 (two) times daily. 2-4 weeks 11/29/17  Yes Gelene Mink, NP  pantoprazole (PROTONIX) 40 MG tablet Take 1 tablet (40 mg total) by mouth daily. Take 30 minutes before breakfast 05/31/17  Yes Gelene Mink, NP  rizatriptan (MAXALT) 10 MG tablet Take 1  tablet (10 mg total) by mouth as needed for migraine. May repeat in 2 hours if needed 12/18/17  Yes Johna Sheriff, MD  diphenhydrAMINE (BENADRYL) 25 MG tablet Take 25 mg by mouth daily.    [provider]     Family History  Problem Relation Age of Onset  . Seizures Mother   . Heart disease Father   . Cancer Father   . Asthma Sister   . Colon cancer Neg Hx   . Colon polyps Neg Hx     Social History   Socioeconomic History  . Marital status: Single    Spouse name: Not on file  . Number of children: Not on file  . Years of education: Not on file  . Highest education level: Not on file  Occupational History  . Not on file  Social Needs  . Financial resource strain: Not on file  . Food insecurity:    Worry: Not on file    Inability: Not on file  . Transportation needs:    Medical: Not on file    Non-medical: Not on file  Tobacco Use  . Smoking status: Current Some Day Smoker    Types: Cigarettes    Start date: 09/20/1999    Last attempt to quit: 09/19/2000    Years since quitting: 17.3  . Smokeless tobacco: Never Used  Substance and Sexual Activity  . Alcohol use: Yes    Comment:  'once in a blue moon"  . Drug use: No  . Sexual activity: Not on file  Lifestyle  . Physical activity:  Days per week: Not on file    Minutes per session: Not on file  . Stress: Not on file  Relationships  . Social connections:    Talks on phone: Not on file    Gets together: Not on file    Attends religious service: Not on file    Active member of club or organization: Not on file    Attends meetings of clubs or organizations: Not on file    Relationship status: Not on file  Other Topics Concern  . Not on file  Social History Narrative  . Not on file     Review of Systems: A 12 point ROS discussed and pertinent positives are indicated in the HPI above.  All other systems are negative.  Review of Systems  Constitutional: Negative for activity change and fever.    Respiratory: Negative for shortness of breath and wheezing.   Cardiovascular: Negative for chest pain and palpitations.  Gastrointestinal: Negative for abdominal pain, nausea and vomiting.  Psychiatric/Behavioral: Negative for behavioral problems and confusion.    Vital Signs: BP (!) 135/100   Pulse 96   Temp 97.8 F (36.6 C)   Resp 18   Ht 5\' 11"  (1.803 m)   Wt 148 lb (67.1 kg)   SpO2 100%   BMI 20.64 kg/m   Physical Exam  Constitutional: He is oriented to person, place, and time. He appears well-developed and well-nourished. No distress.  Cardiovascular: Normal rate, regular rhythm, normal heart sounds and intact distal pulses.  No murmur heard. Pulmonary/Chest: Effort normal and breath sounds normal. He has no wheezes.  Neurological: He is alert and oriented to person, place, and time.  Skin: Skin is warm and dry.  Psychiatric: He has a normal mood and affect. His behavior is normal. Judgment and thought content normal.     MD Evaluation Airway: WNL Heart: WNL Abdomen: WNL Chest/ Lungs: WNL ASA  Classification: 2 Mallampati/Airway Score: Two   Imaging: US Abdomen Complete  Result Date: 12/19/2017 CLINICAL DATA:  Elevated alkaline phosphatase EXAM: ABDOMEN ULTRASOUND COMPLETE COMPARISON:  08/31/2016 ultrasound, [facility] 09/21/2016 FINDINGS: Gallbladder: Normally distended without stones or wall thickening. No pericholecystic fluid or sonographic Murphy sign. Common bile duct: Diameter: Prominent for age 23 mm diameter Liver: Normal appearance. No focal hepatic mass or nodularity. Portal vein is patent on color Doppler imaging with normal direction of blood flow towards the liver. IVC: Normal appearance Pancreas: Obscured by bowel gas Spleen: Normal appearance, 7.8 cm length Right Kidney: Length: 10.1 cm. Normal morphology without mass or hydronephrosis. Left Kidney: Length: 9.9 cm. Normal morphology without mass or hydronephrosis. Abdominal aorta: Normal caliber Other findings:  No free fluid IMPRESSION: Nonvisualization of pancreas due to bowel gas. Prominent CBD for age at 7 mm diameter though no associated intrahepatic biliary dilatation is identified. Remainder of exam unremarkable. Electronically Signed   By: Ulyses Southward M.D.   On: 12/19/2017 09:55    Labs:  CBC: No results for input(s): WBC, HGB, HCT, PLT in the last 8760 hours.  COAGS: No results for input(s): INR, APTT in the last 8760 hours.  BMP: No results for input(s): NA, K, CL, CO2, GLUCOSE, BUN, CALCIUM, CREATININE, GFRNONAA, GFRAA in the last 8760 hours.  Invalid input(s): CMP  LIVER FUNCTION TESTS: Recent Labs    12/05/17 1224  BILITOT 0.8  AST 44*  ALT 82*  PROT 7.2    TUMOR MARKERS: No results for input(s): AFPTM, CEA, CA199, CHROMGRNA in the last 8760 hours.  Assessment and  Plan:  Chronic alkaline phosphatase elevation. Plan for image-guided liver biopsy today with Dr. Miles CostainShick. Patient does not take blood thinners. He denies fever. CBC and INR pending.  Risks and benefits discussed with the patient including, but not limited to bleeding, infection, damage to adjacent structures or low yield requiring additional tests. All of the patient's questions were answered, patient is agreeable to proceed. Consent signed and in chart.  Thank you for this interesting consult.  I greatly enjoyed meeting Hershal CoriaJeffrey Krasinski and look forward to participating in their care.  A copy of this report was sent to the requesting provider on this date.  Electronically Signed: Elwin MochaAlexandra Levent Kornegay, PA-C 01/08/2018, 11:48 AM   I spent a total of 30 Minutes in face to face in clinical consultation, greater than 50% of which was counseling/coordinating care for chronic alkaline phosphatase elevation.

## 2018-01-08 NOTE — Discharge Instructions (Signed)
**Note Jacob Eaton-identified via Obfuscation** Liver Biopsy, Care After °These instructions give you information on caring for yourself after your procedure. Your doctor may also give you more specific instructions. Call your doctor if you have any problems or questions after your procedure. °Follow these instructions at home: °· Rest at home for 1-2 days or as told by your doctor. °· Have someone stay with you for at least 24 hours. °· Do not do these things in the first 24 hours: °? Drive. °? Use machinery. °? Take care of other people. °? Sign legal documents. °? Take a bath or shower. °· There are many different ways to close and cover a cut (incision). For example, a cut can be closed with stitches, skin glue, or adhesive strips. Follow your doctor's instructions on: °? Taking care of your cut. °? Changing and removing your bandage (dressing). °? Removing whatever was used to close your cut. °· Do not drink alcohol in the first week. °· Do not lift more than 5 pounds or play contact sports for the first 2 weeks. °· Take medicines only as told by your doctor. For 1 week, do not take medicine that has aspirin in it or medicines like ibuprofen. °· Get your test results. °Contact a doctor if: °· A cut bleeds and leaves more than just a small spot of blood. °· A cut is red, puffs up (swells), or hurts more than before. °· Fluid or something else comes from a cut. °· A cut smells bad. °· You have a fever or chills. °Get help right away if: °· You have swelling, bloating, or pain in your belly (abdomen). °· You get dizzy or faint. °· You have a rash. °· You feel sick to your stomach (nauseous) or throw up (vomit). °· You have trouble breathing, feel short of breath, or feel faint. °· Your chest hurts. °· You have problems talking or seeing. °· You have trouble balancing or moving your arms or legs. °This information is not intended to replace advice given to you by your health care provider. Make sure you discuss any questions you have with your health care  provider. °Document Released: 06/14/2008 Document Revised: 02/11/2016 Document Reviewed: 11/01/2013 °Elsevier Interactive Patient Education © 2018 [employer] ° °

## 2018-01-12 ENCOUNTER — Telehealth: Payer: Self-pay | Admitting: Gastroenterology

## 2018-01-12 NOTE — Telephone Encounter (Signed)
Patient FMLA papers in your chair

## 2018-01-15 ENCOUNTER — Telehealth: Payer: Self-pay | Admitting: Gastroenterology

## 2018-01-15 NOTE — Telephone Encounter (Signed)
He only needed to be out of work on 4/22 for liver biopsy.   I haven't completed papers yet because I just got them today. He can return now.

## 2018-01-15 NOTE — Telephone Encounter (Signed)
[phone] PLEASE CALL PATIENT AS SOON AS POSSIBLE, HE NEEDS TO KNOW IF HIS PCP CAN RELEASE HIM TO GO BACK TO WORK

## 2018-01-15 NOTE — Telephone Encounter (Signed)
Pt says his paperwork from the surgeon says he's suppose to be on light duty x 14 days. He needs a note saying that he can return to regular duty on Monday. Pt wants to pick up both note and FMLA paper work as soon as possible. Pt is aware that AB just got the papers today and our office will call him when they are ready. Pt also wanted to mention that he lifts a lot of heavy thing at his place of employment.

## 2018-01-16 NOTE — Telephone Encounter (Signed)
Letter was written and ready for pickup. Pt is aware.

## 2018-01-16 NOTE — Telephone Encounter (Signed)
Code is entered, copies made for scanning and patient is aware to pick up.

## 2018-01-16 NOTE — Telephone Encounter (Signed)
Spoke with Beckey Downing, PA. Normally after liver biopsy, it's no lifting greater than 10 lbs for 24-48 hours. What he is describing is a letter that has made exception to this. I don't have access to this. I filled out FMLA paperwork to return Monday, 01/22/18 without restrictions, as he states that is what the other letter is detailing.

## 2018-01-17 ENCOUNTER — Ambulatory Visit: Payer: BLUE CROSS/BLUE SHIELD | Admitting: Family Medicine

## 2018-01-19 NOTE — Progress Notes (Signed)
Liver biopsy is unremarkable. I would limit NSAIDs and avoid if possible. Last thing to check would be iron studies: iron, ferritin, TIBC. Recheck HFP in 3 months regardless.

## 2018-01-22 NOTE — Progress Notes (Signed)
LMOM to call.

## 2018-01-23 NOTE — Progress Notes (Signed)
PT is aware and would like to discuss when he comes in for OV on 02/01/2018.

## 2018-01-29 ENCOUNTER — Encounter: Payer: Self-pay | Admitting: Pediatrics

## 2018-01-29 ENCOUNTER — Ambulatory Visit (INDEPENDENT_AMBULATORY_CARE_PROVIDER_SITE_OTHER): Payer: BLUE CROSS/BLUE SHIELD | Admitting: Pediatrics

## 2018-01-29 VITALS — BP 123/84 | HR 75 | Temp 97.4°F | Ht 71.0 in | Wt 147.0 lb

## 2018-01-29 DIAGNOSIS — R51 Headache: Secondary | ICD-10-CM | POA: Diagnosis not present

## 2018-01-29 DIAGNOSIS — R519 Headache, unspecified: Secondary | ICD-10-CM

## 2018-01-29 NOTE — Progress Notes (Signed)
  Subjective:   Patient ID: Jacob Eaton, male    DOB: 05/25/1981, 37 y.o.   MRN: [id number] CC: Headache (1 month)  HPI: Jacob Eaton is a 37 y.o. male   Here for follow-up headaches.  Has been taking amitriptyline at night.  Has only needed to use rizatriptan a couple times.  He has had 4 headaches since last visit.  He had 2 weeks in the last month that he was off work due to recovery from a liver biopsy.  During that 2 weeks he had no headaches.  Since return to work he has had one headache that started at work.  This headache started in the left side of his neck and went up to his head.  He did not take anything for the headache.  It lasted for several hours.  He is starting a new job next week.  He is hopeful that the stress and noise will be less.  Relevant past medical, surgical, family and social history reviewed. Allergies and medications reviewed and updated. Social History   Tobacco Use  Smoking Status Current Some Day Smoker  . Types: Cigarettes  . Start date: 09/20/1999  . Last attempt to quit: 09/19/2000  . Years since quitting: 17.3  Smokeless Tobacco Never Used   ROS: Per HPI   Objective:    BP 123/84   Pulse 75   Temp (!) 97.4 F (36.3 C) (Oral)   Ht  (1.803 m)   Wt 147 lb (66.7 kg)   BMI 20.50 kg/m   Wt Readings from Last 3 Encounters:  01/29/18 147 lb (66.7 kg)  01/08/18 148 lb (67.1 kg)  12/18/17 148 lb (67.1 kg)    Gen: NAD, alert, cooperative with exam, NCAT EYES: EOMI, no conjunctival injection, or no icterus CV: NRRR, normal S1/S2, no murmur, distal pulses 2+ b/l Resp: CTABL, no wheezes, normal WOB Abd: +BS, soft, NTND.  Ext: No edema, warm Neuro: Alert and oriented, sensation intact bilaterally, strength equal upper and lower extremities, coordination grossly normal. MSK: normal muscle bulk  Assessment & Plan:  Jacob Eaton was seen today for headache.  Diagnoses and all orders for this visit:  Nonintractable headache, unspecified  chronicity pattern, unspecified headache type Some ongoing symptoms.  Continue amitriptyline.  Continue rizatriptan as needed.  As long as okay with GI, okay to use Aleve rarely (less than twice a week) for breakthrough headaches.  Hopefully will continue to improve with job change as headaches seem primarily job-related right now.  Follow up plan: Return in about 6 months (around 08/01/2018) for headache follow up. Rex Kras, MD [facility]

## 2018-02-01 ENCOUNTER — Ambulatory Visit (INDEPENDENT_AMBULATORY_CARE_PROVIDER_SITE_OTHER): Payer: BLUE CROSS/BLUE SHIELD | Admitting: Gastroenterology

## 2018-02-01 ENCOUNTER — Encounter: Payer: Self-pay | Admitting: Gastroenterology

## 2018-02-01 VITALS — BP 117/83 | HR 82 | Temp 97.1°F | Ht 71.0 in | Wt 146.2 lb

## 2018-02-01 DIAGNOSIS — K219 Gastro-esophageal reflux disease without esophagitis: Secondary | ICD-10-CM

## 2018-02-01 DIAGNOSIS — R748 Abnormal levels of other serum enzymes: Secondary | ICD-10-CM

## 2018-02-01 DIAGNOSIS — K5900 Constipation, unspecified: Secondary | ICD-10-CM | POA: Diagnosis not present

## 2018-02-01 MED ORDER — PANTOPRAZOLE SODIUM 40 MG PO TBEC: 40.0000 mg | DELAYED_RELEASE_TABLET | Freq: Every day | ORAL | 3 refills | Status: DC

## 2018-02-01 NOTE — Assessment & Plan Note (Signed)
Continue Protonix once daily.  

## 2018-02-01 NOTE — Progress Notes (Signed)
CC'ED TO PCP 

## 2018-02-01 NOTE — Progress Notes (Signed)
Primary Care Physician:  Eustaquio Maize, MD  Primary GI: Dr. Oneida Alar   Chief Complaint  Patient presents with  . Rectal Pain    f/u. Improved    HPI:   Jacob Eaton is a 37 y.o. male presenting today with a history of GERD on PPI. History of RUQ pain s/p thorough evaluation with [place], HIDA, and MRI due to ?biliary ductal dilatation.Chronic isolated elevation of alk phos, with GGT elevated, AMA, ANA, ASMA all normal. Hep C negative. Needs Hep B vaccination. Normal ceruloplasmin. Alk Phos 235, AST 44, ALT 82 most recently in March 2019, so liver biopsy was completed. This showed unremarkable bile ducts, no significant steatosis, cholestasis, iron stains with mostly mild to focally moderate granular iron deposition of uncertain significance.  I recommended iron studies to wrap up evaluation.  Specimen Gross and Clinical Information At last visit had noted rectal discomfort with bowel movements, scant blood associated. One BM per week. Provided Anusol and Nitro per rectum. Linzess 72 mcg worked well. This was sent to pharmacy. No abdominal pain.   Starts a new job on Monday. Will be working with [employer] and [employer]. Needs to hold off on any further evaluations due to insurance: will not have for 6 months.    Past Medical History:  Diagnosis Date  . Chronic tension headaches     Past Surgical History:  Procedure Laterality Date  . ESOPHAGEAL ATRESIA REPAIR  1990    Current Outpatient Medications  Medication Sig Dispense Refill  . amitriptyline (ELAVIL) 10 MG tablet Take 1 tablet (10 mg total) by mouth at bedtime. 30 tablet 2  . hydrocortisone (ANUSOL-HC) 2.5 % rectal cream Place 1 application rectally 2 (two) times daily. 30 g 1  . ibuprofen (ADVIL,MOTRIN) 200 MG tablet Take 400 mg by mouth every 6 (six) hours as needed.     . linaclotide (LINZESS) 72 MCG capsule Take 1 capsule (72 mcg total) by mouth daily before breakfast. 90 capsule 3  . Nitroglycerin 0.4 % OINT Place 1 inch  rectally 2 (two) times daily. 2-4 weeks (Patient taking differently: Place 1 inch rectally as needed. 2-4 weeks) 1 Tube 1  . pantoprazole (PROTONIX) 40 MG tablet TAKE 1 TABLET (40 MG TOTAL) BY MOUTH DAILY. TAKE 30 MINUTES BEFORE BREAKFAST 90 tablet 0  . rizatriptan (MAXALT) 10 MG tablet Take 1 tablet (10 mg total) by mouth as needed for migraine. May repeat in 2 hours if needed 10 tablet 1  . diphenhydrAMINE (BENADRYL) 25 MG tablet Take 25 mg by mouth daily.     No current facility-administered medications for this visit.     Allergies as of 02/01/2018 - Review Complete 02/01/2018  Allergen Reaction Noted  . Naproxen Hives 04/27/2017    Family History  Problem Relation Age of Onset  . Seizures Mother   . Heart disease Father   . Cancer Father   . Asthma Sister   . Colon cancer Neg Hx   . Colon polyps Neg Hx     Social History   Socioeconomic History  . Marital status: Single    Spouse name: Not on file  . Number of children: Not on file  . Years of education: Not on file  . Highest education level: Not on file  Occupational History  . Not on file  Social Needs  . Financial resource strain: Not on file  . Food insecurity:    Worry: Not on file    Inability: Not on file  .  Transportation needs:    Medical: Not on file    Non-medical: Not on file  Tobacco Use  . Smoking status: Current Some Day Smoker    Types: Cigarettes    Start date: 09/20/1999    Last attempt to quit: 09/19/2000    Years since quitting: 17.3  . Smokeless tobacco: Never Used  Substance and Sexual Activity  . Alcohol use: Yes    Comment:  'once in a blue moon"  . Drug use: No  . Sexual activity: Not on file  Lifestyle  . Physical activity:    Days per week: Not on file    Minutes per session: Not on file  . Stress: Not on file  Relationships  . Social connections:    Talks on phone: Not on file    Gets together: Not on file    Attends religious service: Not on file    Active member of club or  organization: Not on file    Attends meetings of clubs or organizations: Not on file    Relationship status: Not on file  Other Topics Concern  . Not on file  Social History Narrative  . Not on file    Review of Systems: Gen: Denies fever, chills, anorexia. Denies fatigue, weakness, weight loss.  CV: Denies chest pain, palpitations, syncope, peripheral edema, and claudication. Resp: Denies dyspnea at rest, cough, wheezing, coughing up blood, and pleurisy. GI: see HPI  Derm: Denies rash, itching, dry skin Psych: Denies depression, anxiety, memory loss, confusion. No homicidal or suicidal ideation.  Heme: see HPI   Physical Exam: BP 117/83   Pulse 82   Temp (!) 97.1 F (36.2 C) (Oral)   Ht 5' 11"  (1.803 m)   Wt 146 lb 3.2 oz (66.3 kg)   BMI 20.39 kg/m  General:   Alert and oriented. No distress noted. Pleasant and cooperative.  Head:  Normocephalic and atraumatic. Eyes:  Conjuctiva clear without scleral icterus. Mouth:  Oral mucosa pink and moist.  Abdomen:  +BS, soft, non-tender and non-distended. No rebound or guarding. No HSM or masses noted. Msk:  Symmetrical without gross deformities. Normal posture. Extremities:  Without edema. Neurologic:  Alert and  oriented x4 Psych:  Alert and cooperative. Normal mood and affect. Lab Results  Component Value Date   ALT 82 (H) 12/05/2017   AST 44 (H) 12/05/2017   ALKPHOS 284 (H) 01/04/2017   BILITOT 0.8 12/05/2017   Lab Results  Component Value Date   HEPBCAB NON-REACTIVE 12/19/2017   HEPCAB NON-REACTIVE 12/19/2017   Lab Results  Component Value Date   WBC 5.5 01/08/2018   HGB 15.3 01/08/2018   HCT 44.1 01/08/2018   MCV 94.6 01/08/2018   PLT 196 01/08/2018

## 2018-02-01 NOTE — Assessment & Plan Note (Signed)
Chronically elevated with recent increase in transaminases. AMA, ANA, ASMA normal, GGT elevated, viral serologies negative, ceruloplasmin normal, liver biopsy non-specific but with mild to focally moderate granular iron deposition of uncertain significance. Otherwise, unremarkable. Discussed avoidance of NSAIDs, need iron studies, but he is unable to do for 6 months. Will check HFP, iron, TIBC, sats, ferritin when he is able (likely in 6 months). Imaging thus far with [place], HIDA, MRI. Unrevealing.

## 2018-02-01 NOTE — Patient Instructions (Signed)
We will see you in 6 months!  Please call with any concerns in the meantime. Congrats on the new job!  I would like to order additional blood work before your next appointment, so just call us to let us know when your new insurance kicks in.  Try to avoid Ibuprofen, Advil, Aleve, Motrin, etc.   It was a pleasure to see you today. I strive to create trusting relationships with patients to provide genuine, compassionate, and quality care. I value your feedback. If you receive a survey regarding your visit,  I greatly appreciate you taking time to fill this out.   Gelene Mink, PhD, ANP-BC [facility]

## 2018-02-01 NOTE — Assessment & Plan Note (Signed)
Continue Linzess 72 mcg once daily. Will need colonoscopy in future due to history of rectal bleeding, which is likely benign in setting of constipation. However, recommend evaluation when he is able to pursue. Return in 6 months.

## 2018-04-03 ENCOUNTER — Other Ambulatory Visit: Payer: Self-pay | Admitting: *Deleted

## 2018-04-03 MED ORDER — PANTOPRAZOLE SODIUM 40 MG PO TBEC: 40.0000 mg | DELAYED_RELEASE_TABLET | Freq: Every day | ORAL | 3 refills | Status: DC

## 2018-04-05 ENCOUNTER — Other Ambulatory Visit: Payer: Self-pay | Admitting: Pediatrics

## 2018-04-05 DIAGNOSIS — G43809 Other migraine, not intractable, without status migrainosus: Secondary | ICD-10-CM

## 2018-05-05 ENCOUNTER — Other Ambulatory Visit: Payer: Self-pay | Admitting: Pediatrics

## 2018-05-05 DIAGNOSIS — G43809 Other migraine, not intractable, without status migrainosus: Secondary | ICD-10-CM

## 2018-06-02 ENCOUNTER — Other Ambulatory Visit: Payer: Self-pay | Admitting: Pediatrics

## 2018-06-02 DIAGNOSIS — G43809 Other migraine, not intractable, without status migrainosus: Secondary | ICD-10-CM

## 2018-06-04 NOTE — Telephone Encounter (Signed)
Last seen 4/19 Dr Vincent 

## 2018-06-28 ENCOUNTER — Other Ambulatory Visit: Payer: Self-pay | Admitting: Pediatrics

## 2018-06-28 DIAGNOSIS — G43809 Other migraine, not intractable, without status migrainosus: Secondary | ICD-10-CM

## 2018-06-28 NOTE — Telephone Encounter (Signed)
Last seen 01/29/18  Dr Oswaldo Done

## 2018-07-18 ENCOUNTER — Encounter: Payer: Self-pay | Admitting: *Deleted

## 2018-07-18 ENCOUNTER — Ambulatory Visit (INDEPENDENT_AMBULATORY_CARE_PROVIDER_SITE_OTHER): Payer: PRIVATE HEALTH INSURANCE | Admitting: Pediatrics

## 2018-07-18 ENCOUNTER — Encounter: Payer: Self-pay | Admitting: Pediatrics

## 2018-07-18 DIAGNOSIS — G43809 Other migraine, not intractable, without status migrainosus: Secondary | ICD-10-CM

## 2018-07-18 MED ORDER — RIZATRIPTAN BENZOATE 10 MG PO TABS: 10.0000 mg | ORAL_TABLET | ORAL | 2 refills | Status: DC | PRN

## 2018-07-18 MED ORDER — AMITRIPTYLINE HCL 25 MG PO TABS: 25.0000 mg | ORAL_TABLET | Freq: Every day | ORAL | 1 refills | Status: DC

## 2018-07-18 NOTE — Progress Notes (Signed)
  Subjective:   Patient ID: Jacob Eaton, male    DOB: [birth year], 37 y.o.   MRN: [id number] CC: Medical Management of Chronic Issues  HPI: Jacob Eaton is a 37 y.o. male   Headaches better at first on amitriptyline.  Most recent headache started 4 to 5 days ago and lasted until last night.  He had not had a headache before this for 2 to 3 months.  He has been taking amitriptyline every night.  Headache started at the last day of a 10-day work block.  He has enjoyed his new job.  He does not think work makes his headaches worse.  He drinks 2 caffeinated drinks in the morning, another one in the afternoon.  Sleeping about 7 hours a night, going to bed about 10 PM, waking up at 5 AM.  He took the rizatriptan last night with some improvement in the headache.  He had taken to the first day the headache without much improvement.  Points to the back of left side of head with where headache bothering the most.  He wrapped a band and around his head which helps some with a headache when it was present.  Relevant past medical, surgical, family and social history reviewed. Allergies and medications reviewed and updated. Social History   Tobacco Use  Smoking Status Current Some Day Smoker  . Types: Cigarettes  . Start date: 09/20/1999  . Last attempt to quit: 09/19/2000  . Years since quitting: 17.8  Smokeless Tobacco Never Used   ROS: Per HPI   Objective:    BP 113/80   Pulse 95   Temp 97.9 F (36.6 C) (Oral)   Ht 5\' 11"  (1.803 m)   Wt 139 lb 12.8 oz (63.4 kg)   BMI 19.50 kg/m   Wt Readings from Last 3 Encounters:  07/18/18 139 lb 12.8 oz (63.4 kg)  02/01/18 146 lb 3.2 oz (66.3 kg)  01/29/18 147 lb (66.7 kg)    Gen: NAD, alert, cooperative with exam, NCAT EYES: EOMI, no conjunctival injection, or no icterus ENT:  TMs pearly gray b/l, OP without erythema LYMPH: no cervical LAD CV: NRRR, normal S1/S2, no murmur, distal pulses 2+ b/l Resp: CTABL, no wheezes, normal WOB Abd: +BS, soft,  NTND. no guarding or organomegaly Ext: No edema, warm Neuro: Alert and oriented, strength equal b/l UE and LE, CN III-XII intact, sensation intact bilateral extremities, coordination grossly normal  Assessment & Plan:  Calab was seen today for medical management of chronic issues.  Diagnoses and all orders for this visit:  Other migraine without status migrainosus, not intractable Headache free for several weeks until recent headache past few days.  will increase amitriptyline, use Maxalt as needed, no more than twice a week.  -     rizatriptan (MAXALT) 10 MG tablet; Take 1 tablet (10 mg total) by mouth as needed for migraine. May repeat in 2 hours if needed -     amitriptyline (ELAVIL) 25 MG tablet; Take 1 tablet (25 mg total) by mouth at bedtime.   Follow up plan: Return in about 3 months (around 10/18/2018). Jacob Kras, MD [facility]

## 2018-08-01 ENCOUNTER — Ambulatory Visit: Payer: BLUE CROSS/BLUE SHIELD | Admitting: Pediatrics

## 2018-08-08 ENCOUNTER — Encounter: Payer: Self-pay | Admitting: *Deleted

## 2018-08-08 ENCOUNTER — Other Ambulatory Visit: Payer: Self-pay | Admitting: *Deleted

## 2018-08-08 ENCOUNTER — Ambulatory Visit (INDEPENDENT_AMBULATORY_CARE_PROVIDER_SITE_OTHER): Payer: PRIVATE HEALTH INSURANCE | Admitting: Gastroenterology

## 2018-08-08 ENCOUNTER — Encounter: Payer: Self-pay | Admitting: Gastroenterology

## 2018-08-08 VITALS — BP 119/74 | HR 84 | Temp 98.3°F | Ht 71.0 in | Wt 141.6 lb

## 2018-08-08 DIAGNOSIS — R945 Abnormal results of liver function studies: Secondary | ICD-10-CM | POA: Diagnosis not present

## 2018-08-08 DIAGNOSIS — R7989 Other specified abnormal findings of blood chemistry: Secondary | ICD-10-CM | POA: Insufficient documentation

## 2018-08-08 DIAGNOSIS — K625 Hemorrhage of anus and rectum: Secondary | ICD-10-CM

## 2018-08-08 MED ORDER — LINACLOTIDE 72 MCG PO CAPS: 72.0000 ug | ORAL_CAPSULE | Freq: Every day | ORAL | 3 refills | Status: DC

## 2018-08-08 NOTE — Assessment & Plan Note (Signed)
37 year old male with intermittent low-volume rectal bleeding, pain with BMs and previously treated empirically for occult anal fissure but no improvement with nitro. Colonoscopy recommended in past, but due to insurance issues, he has had to postpone till now. Needs diagnostic colonoscopy in the near future. As of note, he is hesitant to use any creams currently. Resume Linzess 72 mcg once daily.   Proceed with colonoscopy with Dr. Darrick PennaFields in the near future. The risks, benefits, and alternatives have been discussed in detail with the patient. They state understanding and desire to proceed.  Linzess 72 mcg once daily

## 2018-08-08 NOTE — Progress Notes (Addendum)
REVIEWED. TSC FOR  [facility].   Referring Provider: Eustaquio Maize, MD Primary Care Physician:  Eustaquio Maize, MD Primary GI: Dr. Oneida Alar   Chief Complaint  Patient presents with  . Gastroesophageal Reflux    sometimes  . Constipation    feels like needles when has bm    HPI:   Jacob Eaton is a 37 y.o. male presenting today with a history of GERD on PPI. History of RUQ pain s/p thorough evaluation with [place], HIDA, and MRI due to ?biliary ductal dilatation.Chronic isolated elevation of alk phos, with GGT elevated, AMA, ANA, ASMA all normal. Hep C negative. Needs Hep B vaccination. Normal ceruloplasmin. Alk Phos 235, AST 44, ALT 82 most recently in March 2019, so liver biopsy was completed. This showed unremarkable bile ducts, no significant steatosis, cholestasis, iron stains with mostly mild to focally moderate granular iron deposition of uncertain significance.  I recommended iron studies to wrap up evaluation.    He was given anusol and nitro March 2019 due to concern for occult anal fissure. Recommended colonoscopy; however, he was between jobs and needed to wait for insurance. Linzess 72 mcg helps with constipation. Feels like needles when has a BM, but as long as taking Linzess, feels better. Doesn't want to use the cream. Intermittent low-volume rectal bleeding. Wants to pursue colonoscopy now.   Past Medical History:  Diagnosis Date  . Chronic tension headaches     Past Surgical History:  Procedure Laterality Date  . ESOPHAGEAL ATRESIA REPAIR  1990    Current Outpatient Medications  Medication Sig Dispense Refill  . amitriptyline (ELAVIL) 25 MG tablet Take 25 mg by mouth at bedtime.    Marland Kitchen ibuprofen (ADVIL,MOTRIN) 200 MG tablet Take 400 mg by mouth every 6 (six) hours as needed.     . pantoprazole (PROTONIX) 40 MG tablet Take 1 tablet (40 mg total) by mouth daily. 90 tablet 3  . rizatriptan (MAXALT) 10 MG tablet Take 1 tablet (10 mg total) by mouth as needed for migraine.  May repeat in 2 hours if needed 10 tablet 2  . linaclotide (LINZESS) 72 MCG capsule Take 1 capsule (72 mcg total) by mouth daily before breakfast. 90 capsule 3   No current facility-administered medications for this visit.     Allergies as of 08/08/2018 - Review Complete 08/08/2018  Allergen Reaction Noted  . Naproxen Hives 04/27/2017    Family History  Problem Relation Age of Onset  . Seizures Mother   . Heart disease Father   . Cancer Father   . Asthma Sister   . Colon cancer Neg Hx   . Colon polyps Neg Hx     Social History   Socioeconomic History  . Marital status: Single    Spouse name: Not on file  . Number of children: Not on file  . Years of education: Not on file  . Highest education level: Not on file  Occupational History  . Not on file  Social Needs  . Financial resource strain: Not on file  . Food insecurity:    Worry: Not on file    Inability: Not on file  . Transportation needs:    Medical: Not on file    Non-medical: Not on file  Tobacco Use  . Smoking status: Former Smoker    Types: Cigarettes    Start date: 09/20/1999    Last attempt to quit: 09/19/2000    Years since quitting: 17.8  . Smokeless tobacco: Never Used  Substance and  Sexual Activity  . Alcohol use: Yes    Comment:  'once in a blue moon"  . Drug use: No  . Sexual activity: Not on file  Lifestyle  . Physical activity:    Days per week: Not on file    Minutes per session: Not on file  . Stress: Not on file  Relationships  . Social connections:    Talks on phone: Not on file    Gets together: Not on file    Attends religious service: Not on file    Active member of club or organization: Not on file    Attends meetings of clubs or organizations: Not on file    Relationship status: Not on file  Other Topics Concern  . Not on file  Social History Narrative  . Not on file    Review of Systems: Gen: Denies fever, chills, anorexia. Denies fatigue, weakness, weight loss.  CV:  Denies chest pain, palpitations, syncope, peripheral edema, and claudication. Resp: Denies dyspnea at rest, cough, wheezing, coughing up blood, and pleurisy. GI: see HPI  Derm: Denies rash, itching, dry skin Psych: Denies depression, anxiety, memory loss, confusion. No homicidal or suicidal ideation.  Heme: Denies bruising, bleeding, and enlarged lymph nodes.  Physical Exam: BP 119/74   Pulse 84   Temp 98.3 F (36.8 C) (Oral)   Ht 5' 11"  (1.803 m)   Wt 141 lb 9.6 oz (64.2 kg)   BMI 19.75 kg/m  General:   Alert and oriented. No distress noted. Pleasant and cooperative.  Head:  Normocephalic and atraumatic. Eyes:  Conjuctiva clear without scleral icterus. Mouth:  Oral mucosa pink and moist. Edentulous Lungs: clear to auscultation bilaterally  Cardiac: S1 S2 present without murmurs  Abdomen:  +BS, soft, non-tender and non-distended. No rebound or guarding. No HSM or masses noted. Msk:  Symmetrical without gross deformities. Normal posture. Extremities:  Without edema. Neurologic:  Alert and  oriented x4 Psych:  Alert and cooperative. Normal mood and affect.

## 2018-08-08 NOTE — Patient Instructions (Signed)
We have arranged a colonoscopy with Dr. Darrick PennaFields in the near future.   I have sent Linzess in to the pharmacy and provided samples. Continue taking this once each day, 30 minutes breakfast daily.  Please have blood work done to check liver numbers and iron levels.   Further recommendations to follow!  I enjoyed seeing you again today! As you know, I value our relationship and want to provide genuine, compassionate, and quality care. I welcome your feedback. If you receive a survey regarding your visit,  I greatly appreciate you taking time to fill this out. See you next time!  Gelene MinkAnna W. Twilla Khouri, PhD, ANP-BC [facility]

## 2018-08-08 NOTE — Assessment & Plan Note (Signed)
Chronic isolated elevation of alk phos, with GGT elevated, AMA, ANA, ASMA all normal. Hep C negative. Needs Hep B vaccination. Normal ceruloplasmin. Alk Phos 235, AST 44, ALT 82 most recently in March 2019, so liver biopsy was completed. This showed unremarkable bile ducts, no significant steatosis, cholestasis, iron stains with mostly mild to focally moderate granular iron deposition of uncertain significance.  Repeat HFP and add iron studies.

## 2018-08-09 LAB — HEPATIC FUNCTION PANEL
AG RATIO: 1.9 (calc) (ref 1.0–2.5)
ALKALINE PHOSPHATASE (APISO): 191 U/L — AB (ref 40–115)
ALT: 38 U/L (ref 9–46)
AST: 22 U/L (ref 10–40)
Albumin: 4.4 g/dL (ref 3.6–5.1)
BILIRUBIN INDIRECT: 0.5 mg/dL (ref 0.2–1.2)
BILIRUBIN TOTAL: 0.6 mg/dL (ref 0.2–1.2)
Bilirubin, Direct: 0.1 mg/dL (ref 0.0–0.2)
Globulin: 2.3 g/dL (calc) (ref 1.9–3.7)
TOTAL PROTEIN: 6.7 g/dL (ref 6.1–8.1)

## 2018-08-09 LAB — IRON,TIBC AND FERRITIN PANEL
%SAT: 68 % (calc) — ABNORMAL HIGH (ref 20–48)
Ferritin: 287 ng/mL (ref 38–380)
Iron: 161 ug/dL (ref 50–180)
TIBC: 238 mcg/dL (calc) — ABNORMAL LOW (ref 250–425)

## 2018-08-09 NOTE — Progress Notes (Signed)
CC'D TO PCP °

## 2018-08-14 ENCOUNTER — Other Ambulatory Visit: Payer: Self-pay

## 2018-08-14 DIAGNOSIS — R7989 Other specified abnormal findings of blood chemistry: Secondary | ICD-10-CM

## 2018-08-14 NOTE — Progress Notes (Signed)
Lab orders have been entered and released for [facility].

## 2018-08-14 NOTE — Progress Notes (Signed)
Left VM to call

## 2018-08-14 NOTE — Progress Notes (Signed)
Patient's sats are elevated, ferritin high normal (> than 200 to 300 in males). We need to check hemochromatosis labs. Please also refer to Hematology due to iron overload.

## 2018-08-20 NOTE — Progress Notes (Signed)
LMOM to call and also mailing a letter to call.

## 2018-08-21 ENCOUNTER — Other Ambulatory Visit: Payer: Self-pay | Admitting: *Deleted

## 2018-08-21 DIAGNOSIS — R7989 Other specified abnormal findings of blood chemistry: Secondary | ICD-10-CM

## 2018-08-21 NOTE — Progress Notes (Signed)
Pt is aware and will go to the lab in the next day or so.  Ok to refer to Hematology.

## 2018-08-21 NOTE — Progress Notes (Signed)
PT left me a vm to call him, I called and left Vm for a return call.

## 2018-08-24 ENCOUNTER — Other Ambulatory Visit: Payer: Self-pay

## 2018-08-24 ENCOUNTER — Inpatient Hospital Stay (HOSPITAL_COMMUNITY): Payer: Self-pay | Attending: Hematology | Admitting: Hematology

## 2018-08-24 ENCOUNTER — Encounter (HOSPITAL_COMMUNITY): Payer: Self-pay | Admitting: Hematology

## 2018-08-24 DIAGNOSIS — Z801 Family history of malignant neoplasm of trachea, bronchus and lung: Secondary | ICD-10-CM | POA: Insufficient documentation

## 2018-08-24 DIAGNOSIS — Z79899 Other long term (current) drug therapy: Secondary | ICD-10-CM | POA: Insufficient documentation

## 2018-08-24 DIAGNOSIS — R748 Abnormal levels of other serum enzymes: Secondary | ICD-10-CM

## 2018-08-24 DIAGNOSIS — K5900 Constipation, unspecified: Secondary | ICD-10-CM | POA: Insufficient documentation

## 2018-08-24 DIAGNOSIS — Z87891 Personal history of nicotine dependence: Secondary | ICD-10-CM | POA: Insufficient documentation

## 2018-08-24 NOTE — Progress Notes (Signed)
CONSULT NOTE  Patient Care Team: Johna SheriffVincent, Carol L, MD as PCP - General (Pediatrics) West BaliFields, Sandi L, MD as Consulting Physician (Gastroenterology)  CHIEF COMPLAINTS/PURPOSE OF CONSULTATION:  Increased iron deposition within hepatocytes.  HISTORY OF PRESENTING ILLNESS:  Jacob Eaton 37 y.o. male is seen in consultation today for further work-up and management of increased iron deposition within hepatocytes.He has been worked up for elevated liver enzymes for the past few months.  Initially was thought to have fatty liver.  An ultrasound of the liver on 12/19/2017 shows nonvisualization of pancreas due to bowel gas.  Prominent CBD for age at 7 mm.  No associated intrahepatic biliary dilatation noted.  He underwent ultrasound-guided biopsy on 01/08/2018 of the liver.  Denies any fevers, night sweats or weight loss in the last 6 months.  Denies any joint pains other than his right hand joints and right knee which he sustained from injuries.  Has history of migraines.  He lives with his mom taking care of her.  Denies cooking in any cast iron pots and pans.  Denies any iron supplements.  He works at [employer].  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  He reports exposure to chemicals like 142. Denies any family history of hemochromatosis.  His father had lung cancer.  Maternal aunt had "bone cancer".  Another maternal aunt had lung cancer and was not a smoker.  Maternal uncle had cancer, unknown to the patient.    MEDICAL HISTORY:  Past Medical History:  Diagnosis Date  . Chronic tension headaches     SURGICAL HISTORY: Past Surgical History:  Procedure Laterality Date  . ESOPHAGEAL ATRESIA REPAIR  1990    SOCIAL HISTORY: Social History   Socioeconomic History  . Marital status: Single    Spouse name: Not on file  . Number of children: Not on file  . Years of education: Not on file  . Highest education level: Not on file   Occupational History  . Not on file  Social Needs  . Financial resource strain: Not on file  . Food insecurity:    Worry: Not on file    Inability: Not on file  . Transportation needs:    Medical: Not on file    Non-medical: Not on file  Tobacco Use  . Smoking status: Former Smoker    Types: Cigarettes    Start date: 09/20/1999    Last attempt to quit: 09/19/2000    Years since quitting: 17.9  . Smokeless tobacco: Never Used  Substance and Sexual Activity  . Alcohol use: Yes    Comment:  'once in a blue moon"  . Drug use: No  . Sexual activity: Not on file  Lifestyle  . Physical activity:    Days per week: Not on file    Minutes per session: Not on file  . Stress: Not on file  Relationships  . Social connections:    Talks on phone: Not on file    Gets together: Not on file    Attends religious service: Not on file    Active member of club or organization: Not on file    Attends meetings of clubs or organizations: Not on file    Relationship status: Not on file  . Intimate partner violence:    Fear of current or ex partner: Not on file    Emotionally abused: Not on file    Physically abused: Not  on file    Forced sexual activity: Not on file  Other Topics Concern  . Not on file  Social History Narrative  . Not on file    FAMILY HISTORY: Family History  Problem Relation Age of Onset  . Seizures Mother   . Heart disease Father   . Cancer Father   . Asthma Sister   . Colon cancer Neg Hx   . Colon polyps Neg Hx     ALLERGIES:  is allergic to naproxen.  MEDICATIONS:  Current Outpatient Medications  Medication Sig Dispense Refill  . amitriptyline (ELAVIL) 25 MG tablet Take 25 mg by mouth at bedtime.    Marland Kitchen ibuprofen (ADVIL,MOTRIN) 200 MG tablet Take 400 mg by mouth every 6 (six) hours as needed.     . linaclotide (LINZESS) 72 MCG capsule Take 1 capsule (72 mcg total) by mouth daily before breakfast. 90 capsule 3  . pantoprazole (PROTONIX) 40 MG tablet Take 1  tablet (40 mg total) by mouth daily. 90 tablet 3  . rizatriptan (MAXALT) 10 MG tablet Take 1 tablet (10 mg total) by mouth as needed for migraine. May repeat in 2 hours if needed 10 tablet 2   No current facility-administered medications for this visit.     REVIEW OF SYSTEMS:   Constitutional: Denies fevers, chills or abnormal night sweats Eyes: Denies blurriness of vision, double vision or watery eyes Ears, nose, mouth, throat, and face: Denies mucositis or sore throat Respiratory: Denies cough, dyspnea or wheezes Cardiovascular: Denies palpitation, chest discomfort or lower extremity swelling Gastrointestinal:  Denies nausea, heartburn or change in bowel habits Skin: Denies abnormal skin rashes Lymphatics: Denies new lymphadenopathy or easy bruising Neurological:Denies numbness, tingling or new weaknesses Behavioral/Psych: Mood is stable, no new changes  All other systems were reviewed with the patient and are negative.  PHYSICAL EXAMINATION: ECOG PERFORMANCE STATUS: 0 - Asymptomatic  Vitals:   08/24/18 1335  BP: 125/82  Pulse: 79  Resp: 16  Temp: 97.8 F (36.6 C)  SpO2: 100%   Filed Weights   08/24/18 1335  Weight: 139 lb (63 kg)    GENERAL:alert, no distress and comfortable SKIN: skin color, texture, turgor are normal, no rashes or significant lesions EYES: normal, conjunctiva are pink and non-injected, sclera clear OROPHARYNX:no exudate, no erythema and lips, buccal mucosa, and tongue normal  NECK: supple, thyroid normal size, non-tender, without nodularity LYMPH:  no palpable lymphadenopathy in the cervical, axillary or inguinal LUNGS: clear to auscultation and percussion with normal breathing effort HEART: regular rate & rhythm and no murmurs and no lower extremity edema ABDOMEN:abdomen soft, non-tender and normal bowel sounds Musculoskeletal:no cyanosis of digits and no clubbing  PSYCH: alert & oriented x 3 with fluent speech NEURO: no focal motor/sensory  deficits  LABORATORY DATA:  I have reviewed the data as listed Recent Results (from the past 2160 hour(s))  Hepatic function panel     Status: Abnormal   Collection Time: 08/08/18  3:49 PM  Result Value Ref Range   Total Protein 6.7 6.1 - 8.1 g/dL   Albumin 4.4 3.6 - 5.1 g/dL   Globulin 2.3 1.9 - 3.7 g/dL (calc)   AG Ratio 1.9 1.0 - 2.5 (calc)   Total Bilirubin 0.6 0.2 - 1.2 mg/dL   Bilirubin, Direct 0.1 0.0 - 0.2 mg/dL   Indirect Bilirubin 0.5 0.2 - 1.2 mg/dL (calc)   Alkaline phosphatase (APISO) 191 (H) 40 - 115 U/L   AST 22 10 - 40 U/L   ALT 38  9 - 46 U/L  Iron, TIBC and Ferritin Panel     Status: Abnormal   Collection Time: 08/08/18  3:49 PM  Result Value Ref Range   Iron 161 50 - 180 mcg/dL   TIBC 409 (L) 811 - 914 mcg/dL (calc)   %SAT 68 (H) 20 - 48 % (calc)   Ferritin 287 38 - 380 ng/mL    RADIOGRAPHIC STUDIES: I have personally reviewed the radiological images as listed and agreed with the findings in the report.  ASSESSMENT & PLAN:  High hepatic iron concentration determined by biopsy of liver 1.  Increased iron deposition within hepatocytes: - This patient had elevated liver enzymes for the past few months. -He had a liver biopsy done on 01/08/2018 which showed well-preserved architecture, hepatic lobules with no significant steatosis, cholestasis or inflammation.  No pathological fibrosis.  Iron stain shows mostly mild to focally moderate granular iron deposition within hepatocytes (1-2+) of uncertain significance. - Iron panel shows increased percent saturation of 68.  Ferritin was 287. -No family history of hemochromatosis.  Hepatitis panel was negative.  Normal ceruloplasmin.  Denies taking any iron supplements. - We will follow-up on the hemochromatosis labs.  2.  Constipation: -He uses Linzess.  He has occasional bleeding.  He is scheduled for colonoscopy next month.     All questions were answered. The patient knows to call the clinic with any problems,  questions or concerns.      Doreatha Massed, MD 08/24/18 2:23 PM

## 2018-08-24 NOTE — Assessment & Plan Note (Signed)
1.  Increased iron deposition within hepatocytes: - This patient had elevated liver enzymes for the past few months. -He had a liver biopsy done on 01/08/2018 which showed well-preserved architecture, hepatic lobules with no significant steatosis, cholestasis or inflammation.  No pathological fibrosis.  Iron stain shows mostly mild to focally moderate granular iron deposition within hepatocytes (1-2+) of uncertain significance. - Iron panel shows increased percent saturation of 68.  Ferritin was 287. -No family history of hemochromatosis.  Hepatitis panel was negative.  Normal ceruloplasmin.  Denies taking any iron supplements. - We will follow-up on the hemochromatosis labs.  2.  Constipation: -He uses Linzess.  He has occasional bleeding.  He is scheduled for colonoscopy next month.

## 2018-08-26 LAB — HEMOCHROMATOSIS DNA-PCR(C282Y,H63D)

## 2018-08-27 NOTE — Progress Notes (Signed)
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 I am copying Dr. Ellin SabaKatragadda on this, who recently saw him at our request.

## 2018-08-29 NOTE — Progress Notes (Signed)
LMOM to call.

## 2018-08-29 NOTE — Progress Notes (Signed)
PT is aware and said he has a follow up appt with Dr. Ellin SabaKatragadda.

## 2018-09-11 ENCOUNTER — Encounter (HOSPITAL_COMMUNITY): Payer: Self-pay | Admitting: Hematology

## 2018-09-11 ENCOUNTER — Inpatient Hospital Stay (HOSPITAL_BASED_OUTPATIENT_CLINIC_OR_DEPARTMENT_OTHER): Payer: Self-pay | Admitting: Hematology

## 2018-09-11 ENCOUNTER — Other Ambulatory Visit: Payer: Self-pay

## 2018-09-11 DIAGNOSIS — Z79899 Other long term (current) drug therapy: Secondary | ICD-10-CM

## 2018-09-11 DIAGNOSIS — K5900 Constipation, unspecified: Secondary | ICD-10-CM

## 2018-09-11 DIAGNOSIS — Z87891 Personal history of nicotine dependence: Secondary | ICD-10-CM

## 2018-09-11 DIAGNOSIS — Z801 Family history of malignant neoplasm of trachea, bronchus and lung: Secondary | ICD-10-CM

## 2018-09-11 NOTE — Progress Notes (Signed)
[facility] [address], [place] [address]   CLINIC:  Medical Oncology/Hematology  PCP:  Johna Sheriff, MD [address] [place] [address] [phone]   REASON FOR VISIT: Follow-up for hemochromatosis   CURRENT THERAPY: intermittent phlebotomies    INTERVAL HISTORY:  Jacob Eaton 37 y.o. male returns for routine follow-up for increased iron deposition within hepatocytes. He is here today with his mother. He has no complaints today. He works a full time job and remains active. Denies any nausea, vomiting, or diarrhea. Denies any new pains. Had not noticed any recent bleeding such as epistaxis, hematuria or hematochezia. Denies recent chest pain on exertion, shortness of breath on minimal exertion, pre-syncopal episodes, or palpitations. Denies any numbness or tingling in hands or feet. Denies any recent fevers, infections, or recent hospitalizations. He reports his appetite and energy level at 100%. He has no problems maintaining his weight.     REVIEW OF SYSTEMS:  Review of Systems  All other systems reviewed and are negative.    PAST MEDICAL/SURGICAL HISTORY:  Past Medical History:  Diagnosis Date  . Chronic tension headaches    Past Surgical History:  Procedure Laterality Date  . ESOPHAGEAL ATRESIA REPAIR  1990     SOCIAL HISTORY:  Social History   Socioeconomic History  . Marital status: Single    Spouse name: Not on file  . Number of children: Not on file  . Years of education: Not on file  . Highest education level: Not on file  Occupational History  . Not on file  Social Needs  . Financial resource strain: Not on file  . Food insecurity:    Worry: Not on file    Inability: Not on file  . Transportation needs:    Medical: Not on file    Non-medical: Not on file  Tobacco Use  . Smoking status: Former Smoker    Types: Cigarettes    Start date: 09/20/1999    Last attempt to quit: 09/19/2000    Years since quitting: 17.9  .  Smokeless tobacco: Never Used  Substance and Sexual Activity  . Alcohol use: Yes    Comment:  'once in a blue moon"  . Drug use: No  . Sexual activity: Not on file  Lifestyle  . Physical activity:    Days per week: Not on file    Minutes per session: Not on file  . Stress: Not on file  Relationships  . Social connections:    Talks on phone: Not on file    Gets together: Not on file    Attends religious service: Not on file    Active member of club or organization: Not on file    Attends meetings of clubs or organizations: Not on file    Relationship status: Not on file  . Intimate partner violence:    Fear of current or ex partner: Not on file    Emotionally abused: Not on file    Physically abused: Not on file    Forced sexual activity: Not on file  Other Topics Concern  . Not on file  Social History Narrative  . Not on file    FAMILY HISTORY:  Family History  Problem Relation Age of Onset  . Seizures Mother   . Heart disease Father   . Cancer Father   . Asthma Sister   . Colon cancer Neg Hx   . Colon polyps Neg Hx     CURRENT  MEDICATIONS:  Outpatient Encounter Medications as of 09/11/2018  Medication Sig  . amitriptyline (ELAVIL) 25 MG tablet Take 25 mg by mouth at bedtime.  Marland Kitchen. ibuprofen (ADVIL,MOTRIN) 200 MG tablet Take 400 mg by mouth every 6 (six) hours as needed.   . pantoprazole (PROTONIX) 40 MG tablet Take 1 tablet (40 mg total) by mouth daily.  . rizatriptan (MAXALT) 10 MG tablet Take 1 tablet (10 mg total) by mouth as needed for migraine. May repeat in 2 hours if needed  . [DISCONTINUED] linaclotide (LINZESS) 72 MCG capsule Take 1 capsule (72 mcg total) by mouth daily before breakfast.   No facility-administered encounter medications on file as of 09/11/2018.     ALLERGIES:  Allergies  Allergen Reactions  . Naproxen Hives     PHYSICAL EXAM:  ECOG Performance status: 1  Vitals:   09/11/18 1000  BP: 117/60  Pulse: 80  Resp: 16  Temp: 97.6 F  (36.4 C)  SpO2: 100%   Filed Weights   09/11/18 1000  Weight: 139 lb 5 oz (63.2 kg)    Physical Exam Constitutional:      Appearance: Normal appearance. He is normal weight.  Abdominal:     General: Abdomen is flat.     Palpations: Abdomen is soft.  Musculoskeletal: Normal range of motion.  Skin:    General: Skin is warm and dry.  Neurological:     General: No focal deficit present.     Mental Status: He is alert and oriented to person, place, and time. Mental status is at baseline.  Psychiatric:        Mood and Affect: Mood normal.        Behavior: Behavior normal.        Thought Content: Thought content normal.        Judgment: Judgment normal.      LABORATORY DATA:  I have reviewed the labs as listed.  CBC    Component Value Date/Time   WBC 5.5 01/08/2018 1134   RBC 4.66 01/08/2018 1134   HGB 15.3 01/08/2018 1134   HGB 15.0 01/04/2017 1505   HCT 44.1 01/08/2018 1134   HCT 44.6 01/04/2017 1505   PLT 196 01/08/2018 1134   PLT 240 01/04/2017 1505   MCV 94.6 01/08/2018 1134   MCV 95 01/04/2017 1505   MCH 32.8 01/08/2018 1134   MCHC 34.7 01/08/2018 1134   RDW 12.9 01/08/2018 1134   RDW 13.0 01/04/2017 1505   LYMPHSABS 0.9 01/04/2017 1505   EOSABS 0.1 01/04/2017 1505   BASOSABS 0.0 01/04/2017 1505   CMP Latest Ref Rng & Units 08/08/2018 12/05/2017 01/04/2017  Glucose 65 - 99 mg/dL - - 82  BUN 6 - 20 mg/dL - - 14  Creatinine 1.610.76 - 1.27 mg/dL - - 0.960.78  Sodium 045134 - 144 mmol/L - - 138  Potassium 3.5 - 5.2 mmol/L - - 4.3  Chloride 96 - 106 mmol/L - - 97  CO2 18 - 29 mmol/L - - 21  Calcium 8.7 - 10.2 mg/dL - - 9.3  Total Protein 6.1 - 8.1 g/dL 6.7 7.2 6.7  Total Bilirubin 0.2 - 1.2 mg/dL 0.6 0.8 0.5  Alkaline Phos 39 - 117 IU/L - - 284(H)  AST 10 - 40 U/L 22 44(H) 36  ALT 9 - 46 U/L 38 82(H) 55(H)       DIAGNOSTIC IMAGING:  I have independently reviewed the scans and discussed with the patient.   I have reviewed Jacob Budandi Drexler Maland, NP's note and agree  with the documentation.  I personally performed a face-to-face visit, made revisions and my assessment and plan is as follows.    ASSESSMENT & PLAN:   Other hemochromatosis 1.  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 -He had a liver biopsy done on 01/08/2018 which showed well-preserved architecture, hepatic lobules with no significant steatosis, cholestasis or inflammation.  No pathological fibrosis.  Iron stain shows mostly mild to focally moderate granular iron deposition within hepatocytes (1-2+) of uncertain significance. - Percent saturation was 68 and ferritin was 287. -Not a regular drinker.  No family history of hemochromatosis on the mother side.  Does not know the family history on the father's side.  Hepatitis panel was negative.  Normal ceruloplasmin. -Hemochromatosis labs showed compound heterozygosity for C282Y and H63D. - Since he has evidence of iron overload in the liver, I have recommended phlebotomy with a goal of ferritin below 50 and percent saturation below 50. -I have counseled him to eat a well-balanced diet.  He was told to avoid supplements containing iron and vitamin C.  He was also told to not to cook in cast iron pots and pans.  I have counseled him to not to drink any alcohol.  He was told to not to eat any raw seafood. -Patient just obtained insurance. -We will make a referral [facility] for phlebotomy every 2 weeks x 2. -I will see him back in 6 weeks for follow-up with repeat labs.  2.  Constipation: -He uses Linzess.  He has occasional bleeding.  He is scheduled for colonoscopy next month.      Orders placed this encounter:  Orders Placed This Encounter  Procedures  . CBC with Differential/Platelet  . Comprehensive metabolic panel  . Ferritin  . Iron and TIBC      Doreatha MassedSreedhar Katragadda, MD [facility] [phone]

## 2018-09-11 NOTE — Patient Instructions (Addendum)
[facility] Discharge Instructions   Follow up in 6 weeks with labs prior to visit.   Thank you for choosing [facility] at [facility] to provide your oncology and hematology care.  To afford each patient quality time with our provider, please arrive at least 15 minutes before your scheduled appointment time.   If you have a lab appointment with the Cancer Center please come in thru the  Main Entrance and check in at the main information desk  You need to re-schedule your appointment should you arrive 10 or more minutes late.  We strive to give you quality time with our providers, and arriving late affects you and other patients whose appointments are after yours.  Also, if you no show three or more times for appointments you may be dismissed from the clinic at the providers discretion.     Again, thank you for choosing [facility].  Our hope is that these requests will decrease the amount of time that you wait before being seen by our physicians.       _____________________________________________________________  Should you have questions after your visit to [facility], please contact our office at [phone] between the hours of 8:00 a.m. and 4:30 p.m.  Voicemails left after 4:00 p.m. will not be returned until the following business day.  For prescription refill requests, have your pharmacy contact our office and allow 72 hours.    Cancer Center Support Programs:   > Cancer Support Group  2nd Tuesday of the month 1pm-2pm, Journey Room

## 2018-09-11 NOTE — Assessment & Plan Note (Signed)
1.  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 -He had a liver biopsy done on 01/08/2018 which showed well-preserved architecture, hepatic lobules with no significant steatosis, cholestasis or inflammation.  No pathological fibrosis.  Iron stain shows mostly mild to focally moderate granular iron deposition within hepatocytes (1-2+) of uncertain significance. - Percent saturation was 68 and ferritin was 287. -Not a regular drinker.  No family history of hemochromatosis on the mother side.  Does not know the family history on the father's side.  Hepatitis panel was negative.  Normal ceruloplasmin. -Hemochromatosis labs showed compound heterozygosity for C282Y and H63D. - Since he has evidence of iron overload in the liver, I have recommended phlebotomy with a goal of ferritin below 50 and percent saturation below 50. -I have counseled him to eat a well-balanced diet.  He was told to avoid supplements containing iron and vitamin C.  He was also told to not to cook in cast iron pots and pans.  I have counseled him to not to drink any alcohol.  He was told to not to eat any raw seafood. -Patient just obtained insurance. -We will make a referral to [facility] for phlebotomy every 2 weeks x 2. -I will see him back in 6 weeks for follow-up with repeat labs.  2.  Constipation: -He uses Linzess.  He has occasional bleeding.  He is scheduled for colonoscopy next month.

## 2018-10-08 ENCOUNTER — Encounter (HOSPITAL_COMMUNITY): Payer: Self-pay

## 2018-10-08 ENCOUNTER — Ambulatory Visit (HOSPITAL_COMMUNITY)
Admission: RE | Admit: 2018-10-08 | Discharge: 2018-10-08 | Disposition: A | Discharge status: Home / Self Care | Payer: PRIVATE HEALTH INSURANCE | Attending: Gastroenterology | Admitting: Gastroenterology

## 2018-10-08 ENCOUNTER — Encounter (HOSPITAL_COMMUNITY)
Admission: RE | Disposition: A | Discharge status: Home / Self Care | Payer: Self-pay | Source: Home / Self Care | Attending: Gastroenterology

## 2018-10-08 ENCOUNTER — Other Ambulatory Visit: Payer: Self-pay

## 2018-10-08 DIAGNOSIS — K602 Anal fissure, unspecified: Secondary | ICD-10-CM | POA: Insufficient documentation

## 2018-10-08 DIAGNOSIS — Z886 Allergy status to analgesic agent status: Secondary | ICD-10-CM | POA: Diagnosis not present

## 2018-10-08 DIAGNOSIS — Z79899 Other long term (current) drug therapy: Secondary | ICD-10-CM | POA: Diagnosis not present

## 2018-10-08 DIAGNOSIS — K625 Hemorrhage of anus and rectum: Secondary | ICD-10-CM | POA: Diagnosis not present

## 2018-10-08 DIAGNOSIS — K6289 Other specified diseases of anus and rectum: Secondary | ICD-10-CM | POA: Insufficient documentation

## 2018-10-08 DIAGNOSIS — G44229 Chronic tension-type headache, not intractable: Secondary | ICD-10-CM | POA: Insufficient documentation

## 2018-10-08 DIAGNOSIS — Z87891 Personal history of nicotine dependence: Secondary | ICD-10-CM | POA: Diagnosis not present

## 2018-10-08 DIAGNOSIS — K921 Melena: Secondary | ICD-10-CM | POA: Diagnosis not present

## 2018-10-08 DIAGNOSIS — F329 Major depressive disorder, single episode, unspecified: Secondary | ICD-10-CM | POA: Diagnosis not present

## 2018-10-08 DIAGNOSIS — K644 Residual hemorrhoidal skin tags: Secondary | ICD-10-CM | POA: Insufficient documentation

## 2018-10-08 DIAGNOSIS — K648 Other hemorrhoids: Secondary | ICD-10-CM | POA: Diagnosis not present

## 2018-10-08 DIAGNOSIS — K529 Noninfective gastroenteritis and colitis, unspecified: Secondary | ICD-10-CM | POA: Diagnosis not present

## 2018-10-08 HISTORY — DX: Major depressive disorder, single episode, unspecified: F32.9

## 2018-10-08 HISTORY — DX: Depression, unspecified: F32.A

## 2018-10-08 HISTORY — PX: COLONOSCOPY: SHX5424

## 2018-10-08 HISTORY — PX: BIOPSY: SHX5522

## 2018-10-08 SURGERY — COLONOSCOPY
Anesthesia: Moderate Sedation

## 2018-10-08 MED ORDER — STERILE WATER FOR IRRIGATION IR SOLN
Status: DC | PRN
  Administered 2018-10-08: 100 mL

## 2018-10-08 MED ORDER — MIDAZOLAM HCL 5 MG/5ML IJ SOLN
INTRAMUSCULAR | Status: AC
  Filled 2018-10-08: qty 10

## 2018-10-08 MED ORDER — MEPERIDINE HCL 100 MG/ML IJ SOLN
INTRAMUSCULAR | Status: AC
  Filled 2018-10-08: qty 2

## 2018-10-08 MED ORDER — MIDAZOLAM HCL 5 MG/5ML IJ SOLN
INTRAMUSCULAR | Status: DC | PRN
  Administered 2018-10-08 (×2): 2 mg via INTRAVENOUS
  Administered 2018-10-08 (×2): 1 mg via INTRAVENOUS

## 2018-10-08 MED ORDER — SODIUM CHLORIDE 0.9 % IV SOLN
INTRAVENOUS | Status: DC
  Administered 2018-10-08: 12:00:00 via INTRAVENOUS

## 2018-10-08 MED ORDER — MEPERIDINE HCL 100 MG/ML IJ SOLN
INTRAMUSCULAR | Status: DC | PRN
  Administered 2018-10-08: 25 mg via INTRAVENOUS
  Administered 2018-10-08: 50 mg via INTRAVENOUS

## 2018-10-08 NOTE — Discharge Instructions (Signed)
You have internal hemorrhoids AND AN ANAL FISSURE, WHICH CAN MAKE YOU HAVE RECTAL BLEEDING AND PAIN WHEN STOOL PASSES ESPECIALLY IF IT'S HARD. YOU DID NOT HAVE ANY POLYPS.   DRINK WATER TO KEEP YOUR URINE LIGHT YELLOW.  FOLLOW A HIGH FIBER DIET. AVOID ITEMS THAT CAUSE BLOATING. SEE INFO BELOW.  CONTINUE LINZESS.  USE PREPARATION H FOUR TIMES  A DAY IF NEEDED TO RELIEVE RECTAL PAIN/PRESSURE/BLEEDING.  YOUR BIOPSY RESULTS WILL BE BACK IN 5 BUSINESS DAYS.   FOLLOW UP IN 4 MOS.   Next colonoscopy in  AT AGE 38.  Colonoscopy Care After Read the instructions outlined below and refer to this sheet in the next week. These discharge instructions provide you with general information on caring for yourself after you leave the hospital. While your treatment has been planned according to the most current medical practices available, unavoidable complications occasionally occur. If you have any problems or questions after discharge, call DR. Aranza Geddes, [phone].  ACTIVITY  You may resume your regular activity, but move at a slower pace for the next 24 hours.   Take frequent rest periods for the next 24 hours.   Walking will help get rid of the air and reduce the bloated feeling in your belly (abdomen).   No driving for 24 hours (because of the medicine (anesthesia) used during the test).   You may shower.   Do not sign any important legal documents or operate any machinery for 24 hours (because of the anesthesia used during the test).    NUTRITION  Drink plenty of fluids.   You may resume your normal diet as instructed by your doctor.   Begin with a light meal and progress to your normal diet. Heavy or fried foods are harder to digest and may make you feel sick to your stomach (nauseated).   Avoid alcoholic beverages for 24 hours or as instructed.    MEDICATIONS  You may resume your normal medications.   WHAT YOU CAN EXPECT TODAY  Some feelings of bloating in the abdomen.     Passage of more gas than usual.   Spotting of blood in your stool or on the toilet paper  .  IF YOU HAD POLYPS REMOVED DURING THE COLONOSCOPY:  Eat a soft diet IF YOU HAVE NAUSEA, BLOATING, ABDOMINAL PAIN, OR VOMITING.    FINDING OUT THE RESULTS OF YOUR TEST Not all test results are available during your visit. DR. Darrick PennaFIELDS WILL CALL YOU WITHIN 14 DAYS OF YOUR PROCEDUE WITH YOUR RESULTS. Do not assume everything is normal if you have not heard from DR. Jahmia Berrett, CALL HER OFFICE AT [phone].  SEEK IMMEDIATE MEDICAL ATTENTION AND CALL THE OFFICE: [phone] IF:  You have more than a spotting of blood in your stool.   Your belly is swollen (abdominal distention).   You are nauseated or vomiting.   You have a temperature over 101F.   You have abdominal pain or discomfort that is severe or gets worse throughout the day.  High-Fiber Diet A high-fiber diet changes your normal diet to include more whole grains, legumes, fruits, and vegetables. Changes in the diet involve replacing refined carbohydrates with unrefined foods. The calorie level of the diet is essentially unchanged. The Dietary Reference Intake (recommended amount) for adult males is 38 grams per day. For adult females, it is 25 grams per day. Pregnant and lactating women should consume 28 grams of fiber per day. Fiber is the intact part of a plant that is not broken down  during digestion. Functional fiber is fiber that has been isolated from the plant to provide a beneficial effect in the body.  PURPOSE  Increase stool bulk.   Ease and regulate bowel movements.   Lower cholesterol.   REDUCE RISK OF COLON CANCER  INDICATIONS THAT YOU NEED MORE FIBER  Constipation and hemorrhoids.   Uncomplicated diverticulosis (intestine condition) and irritable bowel syndrome.   Weight management.   As a protective measure against hardening of the arteries (atherosclerosis), diabetes, and cancer.   GUIDELINES FOR INCREASING  FIBER IN THE DIET  Start adding fiber to the diet slowly. A gradual increase of about 5 more grams (2 slices of whole-wheat bread, 2 servings of most fruits or vegetables, or 1 bowl of high-fiber cereal) per day is best. Too rapid an increase in fiber may result in constipation, flatulence, and bloating.   Drink enough water and fluids to keep your urine clear or pale yellow. Water, juice, or caffeine-free drinks are recommended. Not drinking enough fluid may cause constipation.   Eat a variety of high-fiber foods rather than one type of fiber.   Try to increase your intake of fiber through using high-fiber foods rather than fiber pills or supplements that contain small amounts of fiber.   The goal is to change the types of food eaten. Do not supplement your present diet with high-fiber foods, but replace foods in your present diet.    INCLUDE A VARIETY OF FIBER SOURCES  Replace refined and processed grains with whole grains, canned fruits with fresh fruits, and incorporate other fiber sources. White rice, white breads, and most bakery goods contain little or no fiber.   Brown whole-grain rice, buckwheat oats, and many fruits and vegetables are all good sources of fiber. These include: broccoli, Brussels sprouts, cabbage, cauliflower, beets, sweet potatoes, white potatoes (skin on), carrots, tomatoes, eggplant, squash, berries, fresh fruits, and dried fruits.   Cereals appear to be the richest source of fiber. Cereal fiber is found in whole grains and bran. Bran is the fiber-rich outer coat of cereal grain, which is largely removed in refining. In whole-grain cereals, the bran remains. In breakfast cereals, the largest amount of fiber is found in those with "bran" in their names. The fiber content is sometimes indicated on the label.   You may need to include additional fruits and vegetables each day.   In baking, for 1 cup white flour, you may use the following substitutions:   1 cup  whole-wheat flour minus 2 tablespoons.   1/2 cup white flour plus 1/2 cup whole-wheat flour.

## 2018-10-08 NOTE — H&P (Signed)
Primary Care Physician:  Johna Sheriff, MD Primary Gastroenterologist:  Dr. Darrick Penna  Pre-Procedure History & Physical: HPI:  Jacob Eaton is a 38 y.o. male here for  BRBPR.   Past Medical History:  Diagnosis Date  . Chronic tension headaches   . Depression     Past Surgical History:  Procedure Laterality Date  . ESOPHAGEAL ATRESIA REPAIR  1990    Prior to Admission medications   Medication Sig Start Date End Date Taking? Authorizing Provider  amitriptyline (ELAVIL) 25 MG tablet Take 25 mg by mouth at bedtime.   Yes [provider]  aspirin-acetaminophen-caffeine (EXCEDRIN MIGRAINE) [phone] MG tablet Take 2 tablets by mouth every 6 (six) hours as needed for headache.   Yes [provider]  pantoprazole (PROTONIX) 40 MG tablet Take 1 tablet (40 mg total) by mouth daily. 04/03/18  Yes Fields, Sandi L, MD  rizatriptan (MAXALT) 10 MG tablet Take 1 tablet (10 mg total) by mouth as needed for migraine. May repeat in 2 hours if needed 07/18/18  Yes Johna Sheriff, MD    Allergies as of 08/08/2018 - Review Complete 08/08/2018  Allergen Reaction Noted  . Naproxen Hives 04/27/2017    Family History  Problem Relation Age of Onset  . Seizures Mother   . Heart disease Father   . Cancer Father   . Asthma Sister   . Colon cancer Neg Hx   . Colon polyps Neg Hx     Social History   Socioeconomic History  . Marital status: Single    Spouse name: Not on file  . Number of children: Not on file  . Years of education: Not on file  . Highest education level: Not on file  Occupational History  . Not on file  Social Needs  . Financial resource strain: Not on file  . Food insecurity:    Worry: Not on file    Inability: Not on file  . Transportation needs:    Medical: Not on file    Non-medical: Not on file  Tobacco Use  . Smoking status: Former Smoker    Types: Cigarettes    Start date: 09/20/1999    Last attempt to quit: 09/19/2000    Years since  quitting: 18.0  . Smokeless tobacco: Never Used  Substance and Sexual Activity  . Alcohol use: Yes    Comment:  'once in a blue moon"  . Drug use: No  . Sexual activity: Not on file  Lifestyle  . Physical activity:    Days per week: Not on file    Minutes per session: Not on file  . Stress: Not on file  Relationships  . Social connections:    Talks on phone: Not on file    Gets together: Not on file    Attends religious service: Not on file    Active member of club or organization: Not on file    Attends meetings of clubs or organizations: Not on file    Relationship status: Not on file  . Intimate partner violence:    Fear of current or ex partner: Not on file    Emotionally abused: Not on file    Physically abused: Not on file    Forced sexual activity: Not on file  Other Topics Concern  . Not on file  Social History Narrative  . Not on file    Review of Systems: See HPI, otherwise negative ROS   Physical Exam: BP 119/86   Pulse 85  Temp 97.8 F (36.6 C) (Oral)   Resp 13   Ht 6' (1.829 m)   Wt 63 kg   SpO2 100%   BMI 18.85 kg/m  General:   Alert,  pleasant and cooperative in NAD Head:  Normocephalic and atraumatic. Neck:  Supple; Lungs:  Clear throughout to auscultation.    Heart:  Regular rate and rhythm. Abdomen:  Soft, nontender and nondistended. Normal bowel sounds, without guarding, and without rebound.   Neurologic:  Alert and  oriented x4;  grossly normal neurologically.  Impression/Plan:    BRBPR  PLAN: TCS TODAY. DISCUSSED PROCEDURE, BENEFITS, & RISKS: < 1% chance of medication reaction, bleeding, perforation, or rupture of spleen/liver.

## 2018-10-08 NOTE — Progress Notes (Addendum)
Please excuse Jacob Eaton from work on Monday 10-08-2018 and Tuesday 10-09-2018.  He cannot drive , operate heavy machinery , or sign legal documents for 24 hours. He can return to regular activities , including work and driving after 2pm on Tuesday 10-09-2018

## 2018-10-08 NOTE — Op Note (Signed)
[facility] Patient Name: Jacob Eaton Procedure Date: 10/08/2018 12:02 PM MRN: [id number] Date of Birth: Jun 11, 1981 Attending MD: Jonette Eva MD, MD CSN: [id number] Age: 38 Admit Type: Outpatient Procedure:                Colonoscopy WITH COLD FORCEPS BIOPSY Indications:              Hematochezia Providers:                Jonette Eva MD, MD, Edrick Kins, RN, Edythe Clarity,                            Technician Referring MD:             Norwood Levo. Vincent Medicines:                Meperidine 75 mg IV, Midazolam 6 mg IV Complications:            No immediate complications. Estimated Blood Loss:     Estimated blood loss was minimal. Procedure:                Pre-Anesthesia Assessment:                           - Prior to the procedure, a History and Physical                            was performed, and patient medications and                            allergies were reviewed. The patient's tolerance of                            previous anesthesia was also reviewed. The risks                            and benefits of the procedure and the sedation                            options and risks were discussed with the patient.                            All questions were answered, and informed consent                            was obtained. Prior Anticoagulants: The patient has                            taken Celebrex (celecoxib), last dose was day of                            procedure. ASA Grade Assessment: I - A normal,                            healthy patient. After reviewing the risks and  benefits, the patient was deemed in satisfactory                            condition to undergo the procedure. After obtaining                            informed consent, the colonoscope was passed under                            direct vision. Throughout the procedure, the                            patient's blood pressure, pulse, and oxygen                             saturations were monitored continuously. The                            [id number] ([id number]) scope was introduced through                            the anus and advanced to the 6 cm into the ileum.                            The colonoscopy was somewhat difficult due to a                            tortuous colon. Successful completion of the                            procedure was aided by straightening and shortening                            the scope to obtain bowel loop reduction and                            COLOWRAP. The patient tolerated the procedure well.                            The quality of the bowel preparation was good. The                            terminal ileum, ileocecal valve, appendiceal                            orifice, and rectum were photographed. Scope In: 1:06:46 PM Scope Out: 1:24:36 PM Scope Withdrawal Time: 0 hours 15 minutes 30 seconds  Total Procedure Duration: 0 hours 17 minutes 50 seconds  Findings:      The terminal ileum appeared normal.      Diffuse mild inflammation characterized by congestion (edema) and       erythema was found in the rectum. This was biopsied with a cold forceps       for histology.  The recto-sigmoid colon and sigmoid colon were mildly redundant. This       was biopsied with a cold forceps for histology.      External and internal hemorrhoids were found.      An anal fissure was found on perianal exam. Impression:               - The examined portion of the ileum was normal.                           - RECTAL BLEEDING/PAIN MOST LIKELY DUE TO ANAL                            FISSURE/HEMORRHOIDS Moderate Sedation:      Moderate (conscious) sedation was administered by the endoscopy nurse       and supervised by the endoscopist. The following parameters were       monitored: oxygen saturation, heart rate, blood pressure, and response       to care. Total physician intraservice time was 26  minutes. Recommendation:           - Patient has a contact number available for                            emergencies. The signs and symptoms of potential                            delayed complications were discussed with the                            patient. Return to normal activities tomorrow.                            Written discharge instructions were provided to the                            patient.                           - High fiber diet. AVOID CONSTIPATION.                           - Await pathology results.                           - Repeat colonoscopy at age 38 for surveillance.                           - Return to GI office in 4 months.                           - Continue present medications. CONTINUE LINZESS. Procedure Code(s):        --- Professional ---                           [phone], Colonoscopy, flexible; diagnostic, including  collection of specimen(s) by brushing or washing,                            when performed (separate procedure)                           99153, Moderate sedation; each additional 15                            minutes intraservice time                           G0500, Moderate sedation services provided by the                            same physician or other qualified health care                            professional performing a gastrointestinal                            endoscopic service that sedation supports,                            requiring the presence of an independent trained                            observer to assist in the monitoring of the                            patient's level of consciousness and physiological                            status; initial 15 minutes of intra-service time;                            patient age 72 years or older (additional time may                            be reported with 70962, as appropriate) Diagnosis Code(s):        --- Professional ---                            K92.1, Melena (includes Hematochezia) CPT copyright 2018 [employer]. All rights reserved. The codes documented in this report are preliminary and upon coder review may  be revised to meet current compliance requirements. Jonette Eva, MD Jonette Eva MD, MD 10/08/2018 1:42:52 PM This report has been signed electronically. Number of Addenda: 0

## 2018-10-09 ENCOUNTER — Telehealth: Payer: Self-pay | Admitting: Gastroenterology

## 2018-10-09 NOTE — Telephone Encounter (Signed)
OV made °

## 2018-10-09 NOTE — Telephone Encounter (Signed)
PLEASE CALL PT. HIS RECTAL BIOPSIES SHOW MILD INFLAMMATION DUE TO MEDS (NSAIDS) OR BOWEL PREP. HIS COLON BIOPSIES ARE NORMAL. YOUR RECTAL BLEEDING/PAIN ARE DUE TO have internal hemorrhoids AND AN ANAL FISSURE   DRINK WATER TO KEEP YOUR URINE LIGHT YELLOW.  FOLLOW A HIGH FIBER DIET. AVOID ITEMS THAT CAUSE BLOATING.  CONTINUE LINZESS.  USE PREPARATION H FOUR TIMES  A DAY IF NEEDED TO RELIEVE RECTAL PAIN/PRESSURE/BLEEDING.  FOLLOW UP IN 4 MOS.   Next colonoscopy in  AT AGE 37.

## 2018-10-10 ENCOUNTER — Encounter (HOSPITAL_COMMUNITY): Payer: Self-pay | Admitting: Gastroenterology

## 2018-10-10 NOTE — Telephone Encounter (Signed)
LMOM to call.

## 2018-10-11 ENCOUNTER — Telehealth: Payer: Self-pay | Admitting: *Deleted

## 2018-10-11 NOTE — Telephone Encounter (Signed)
Spoke with patient's mother and informed that he will need to reschedule appt for 10/18/2018 with Dr. Oswaldo Done.  Mother will have patient call back to reschedule

## 2018-10-11 NOTE — Telephone Encounter (Signed)
Mailed a letter for pt to call for results.

## 2018-10-15 ENCOUNTER — Telehealth: Payer: Self-pay

## 2018-10-15 NOTE — Telephone Encounter (Signed)
Pt called for results from procedure. Jacob Eaton he is constipated and has not had a Bm since last Monday. He could not afford Linzess. Tobi Bastos, please advise!

## 2018-10-15 NOTE — Telephone Encounter (Signed)
LMOM to call.  Amitiza 8 mcg #12 at front to pick up to take one capsule twice a day with food. Sorry, I forgot to say I went over results with patient when he called.

## 2018-10-15 NOTE — Telephone Encounter (Signed)
Take Miralax once to twice daily. Let's provide samples of Amitiza 8 mcg if possible. Take BID with food. Please see phone note from last week: Dr. Darrick Penna already addressed the results,but it looks like there was difficulty contacting him.

## 2018-10-16 NOTE — Telephone Encounter (Signed)
PT is aware.

## 2018-10-18 ENCOUNTER — Other Ambulatory Visit (HOSPITAL_COMMUNITY): Payer: Self-pay

## 2018-10-18 ENCOUNTER — Ambulatory Visit: Payer: Self-pay | Admitting: Pediatrics

## 2018-10-19 ENCOUNTER — Encounter: Payer: Self-pay | Admitting: Family Medicine

## 2018-10-19 ENCOUNTER — Ambulatory Visit: Payer: PRIVATE HEALTH INSURANCE | Admitting: Family Medicine

## 2018-10-19 VITALS — BP 119/82 | HR 89 | Temp 97.2°F | Ht 72.0 in | Wt 137.0 lb

## 2018-10-19 DIAGNOSIS — E782 Mixed hyperlipidemia: Secondary | ICD-10-CM

## 2018-10-19 DIAGNOSIS — G43909 Migraine, unspecified, not intractable, without status migrainosus: Secondary | ICD-10-CM | POA: Insufficient documentation

## 2018-10-19 DIAGNOSIS — K760 Fatty (change of) liver, not elsewhere classified: Secondary | ICD-10-CM | POA: Diagnosis not present

## 2018-10-19 DIAGNOSIS — Z114 Encounter for screening for human immunodeficiency virus [HIV]: Secondary | ICD-10-CM

## 2018-10-19 DIAGNOSIS — K219 Gastro-esophageal reflux disease without esophagitis: Secondary | ICD-10-CM | POA: Diagnosis not present

## 2018-10-19 DIAGNOSIS — G43809 Other migraine, not intractable, without status migrainosus: Secondary | ICD-10-CM | POA: Diagnosis not present

## 2018-10-19 NOTE — Progress Notes (Signed)
Subjective:    Patient ID: Jacob Eaton, male    DOB: 09/21/1980, 38 y.o.   MRN: [id number]  Chief Complaint:  Medical Management of Chronic Issues (migraines) and Labwork for Dr. Derek Jack   HPI: Jacob Eaton is a 38 y.o. male presenting on 10/19/2018 for Medical Management of Chronic Issues (migraines) and Labwork for Dr. Derek Jack   1. Nonalcoholic hepatosteatosis  Denies abdominal pain. No nausea or vomiting. No fatigue or malaise. No skin color changes. Does see gastroenterology.   2. Gastroesophageal reflux disease without esophagitis  Well controlled with medications and diet. Denies cough, sore throat, hemoptysis, or dysphagia.   3. Hereditary hemochromatosis ([place])  Followed by oncology / hematology. Has therapeutic phlebotomies.    4. Other migraine without status migrainosus, not intractable  Well controlled with preventative and abortive medications. Last headache over 2 weeks ago and only lasted 20 minutes.    5. Mixed hyperlipidemia  Not on medications. Does not watch diet closely. Does not exercise on a regular basis.    6. Encounter for screening for HIV  Has not had completed. One time screening will be completed today. Denies IV drug use or risky sexual behaviors.      Relevant past medical, surgical, family, and social history reviewed and updated as indicated.  Allergies and medications reviewed and updated.   Past Medical History:  Diagnosis Date  . Chronic tension headaches   . Depression     Past Surgical History:  Procedure Laterality Date  . BIOPSY  10/08/2018   Procedure: BIOPSY;  Surgeon: Danie Binder, MD;  [facility];  Service: Endoscopy;;  sigmoid and rectal bx's  . COLONOSCOPY N/A 10/08/2018   Procedure: COLONOSCOPY;  Surgeon: Danie Binder, MD;  [facility];  Service: Endoscopy;  Laterality: N/A;  1:00pm  . ESOPHAGEAL ATRESIA REPAIR  1990    Social History   Socioeconomic History    . Marital status: Single    Spouse name: Not on file  . Number of children: Not on file  . Years of education: Not on file  . Highest education level: Not on file  Occupational History  . Not on file  Social Needs  . Financial resource strain: Not on file  . Food insecurity:    Worry: Not on file    Inability: Not on file  . Transportation needs:    Medical: Not on file    Non-medical: Not on file  Tobacco Use  . Smoking status: Former Smoker    Types: Cigarettes    Start date: 09/20/1999    Last attempt to quit: 09/19/2000    Years since quitting: 18.0  . Smokeless tobacco: Never Used  Substance and Sexual Activity  . Alcohol use: Yes    Comment:  'once in a blue moon"  . Drug use: No  . Sexual activity: Not on file  Lifestyle  . Physical activity:    Days per week: Not on file    Minutes per session: Not on file  . Stress: Not on file  Relationships  . Social connections:    Talks on phone: Not on file    Gets together: Not on file    Attends religious service: Not on file    Active member of club or organization: Not on file    Attends meetings of clubs or organizations: Not on file    Relationship status: Not on file  . Intimate partner violence:  Fear of current or ex partner: Not on file    Emotionally abused: Not on file    Physically abused: Not on file    Forced sexual activity: Not on file  Other Topics Concern  . Not on file  Social History Narrative  . Not on file    Outpatient Encounter Medications as of 10/19/2018  Medication Sig  . amitriptyline (ELAVIL) 25 MG tablet Take 25 mg by mouth at bedtime.  Marland Kitchen aspirin-acetaminophen-caffeine (EXCEDRIN MIGRAINE) 250-250-65 MG tablet Take 2 tablets by mouth every 6 (six) hours as needed for headache.  . pantoprazole (PROTONIX) 40 MG tablet Take 1 tablet (40 mg total) by mouth daily.  . rizatriptan (MAXALT) 10 MG tablet Take 1 tablet (10 mg total) by mouth as needed for migraine. May repeat in 2 hours if  needed   No facility-administered encounter medications on file as of 10/19/2018.     Allergies  Allergen Reactions  . Naproxen Hives    Review of Systems  Constitutional: Negative for chills, fatigue and fever.  Respiratory: Negative for cough, choking and shortness of breath.   Cardiovascular: Negative for chest pain, palpitations and leg swelling.  Gastrointestinal: Positive for constipation. Negative for abdominal pain, diarrhea, nausea and vomiting.  Skin: Negative for color change, pallor and rash.  Neurological: Positive for headaches. Negative for dizziness, tremors, seizures, syncope, facial asymmetry, speech difficulty, weakness, light-headedness and numbness.  Hematological: Negative for adenopathy. Does not bruise/bleed easily.  All other systems reviewed and are negative.       Objective:    BP 119/82   Pulse 89   Temp (!) 97.2 F (36.2 C) (Oral)   Ht 6' (1.829 m)   Wt 137 lb (62.1 kg)   BMI 18.58 kg/m    Wt Readings from Last 3 Encounters:  10/19/18 137 lb (62.1 kg)  10/08/18 139 lb (63 kg)  09/11/18 139 lb 5 oz (63.2 kg)    Physical Exam Vitals signs and nursing note reviewed.  Constitutional:      General: He is awake. He is not in acute distress.    Appearance: Normal appearance. He is well-developed and well-groomed. He is not ill-appearing or toxic-appearing.  HENT:     Head: Normocephalic and atraumatic.     Right Ear: Tympanic membrane, ear canal and external ear normal.     Left Ear: Tympanic membrane, ear canal and external ear normal.     Nose: Nose normal.     Mouth/Throat:     Lips: Pink.     Mouth: Mucous membranes are moist.     Pharynx: Oropharynx is clear.  Eyes:     General: No scleral icterus.    Conjunctiva/sclera: Conjunctivae normal.     Pupils: Pupils are equal, round, and reactive to light.  Neck:     Musculoskeletal: Normal range of motion and neck supple.  Cardiovascular:     Rate and Rhythm: Normal rate and regular  rhythm.     Heart sounds: Normal heart sounds. No murmur. No friction rub. No gallop.   Pulmonary:     Effort: Pulmonary effort is normal. No respiratory distress.     Breath sounds: Normal breath sounds.  Abdominal:     General: Bowel sounds are normal. There is no distension.     Palpations: Abdomen is soft.     Tenderness: There is no abdominal tenderness.  Skin:    General: Skin is warm and dry.     Capillary Refill: Capillary refill takes less  than 2 seconds.     Coloration: Skin is not jaundiced.     Findings: No bruising.  Neurological:     General: No focal deficit present.     Mental Status: He is alert and oriented to person, place, and time.  Psychiatric:        Mood and Affect: Mood normal.        Behavior: Behavior normal. Behavior is cooperative.        Thought Content: Thought content normal.        Judgment: Judgment normal.     Results for orders placed or performed in visit on 08/14/18  Hemochromatosis DNA-PCR(c282y,h63d)  Result Value Ref Range   DNA MUTATION ANALYSIS see note        Pertinent labs & imaging results that were available during my care of the patient were reviewed by me and considered in my medical decision making.  Assessment & Plan:  Deovion was seen today for medical management of chronic issues and labwork for dr. Derek Jack.  Diagnoses and all orders for this visit:  Nonalcoholic hepatosteatosis -     CMP14+EGFR  Gastroesophageal reflux disease without esophagitis Stable with Protonix. Diet and preventative measures discussed. Continue medications as prescribed. Report any new or worsening symptoms.   Hereditary hemochromatosis ([place]) Pt has therapeutic phlebotomies for disorder. Followed by oncology / hematology.  -     CMP14+EGFR -     CBC with Differential/Platelet -     Anemia Profile B  Other migraine without status migrainosus, not intractable Stable. Continue preventative and abortive medications as prescribed.    Mixed hyperlipidemia Diet and exercise encouraged. Labs pending.  -     Lipid panel  Encounter for screening for HIV -     HIV Antibody (routine testing w rflx)     Continue all other maintenance medications.  Follow up plan: Return in about 3 months (around 01/17/2019), or if symptoms worsen or fail to improve.  Educational handout given for health maintenance   The above assessment and management plan was discussed with the patient. The patient verbalized understanding of and has agreed to the management plan. Patient is aware to call the clinic if symptoms persist or worsen. Patient is aware when to return to the clinic for a follow-up visit. Patient educated on when it is appropriate to go to the emergency department.   Monia Pouch, FNP-C [facility] [phone]

## 2018-10-19 NOTE — Patient Instructions (Signed)

## 2018-10-20 LAB — CMP14+EGFR
ALT: 45 IU/L — ABNORMAL HIGH (ref 0–44)
AST: 29 IU/L (ref 0–40)
Albumin/Globulin Ratio: 2.1 (ref 1.2–2.2)
Albumin: 4.6 g/dL (ref 4.0–5.0)
Alkaline Phosphatase: 243 IU/L — ABNORMAL HIGH (ref 39–117)
BUN/Creatinine Ratio: 19 (ref 9–20)
BUN: 18 mg/dL (ref 6–20)
Bilirubin Total: 0.5 mg/dL (ref 0.0–1.2)
CO2: 22 mmol/L (ref 20–29)
Calcium: 9.6 mg/dL (ref 8.7–10.2)
Chloride: 104 mmol/L (ref 96–106)
Creatinine, Ser: 0.97 mg/dL (ref 0.76–1.27)
GFR calc Af Amer: 114 mL/min/{1.73_m2} (ref >59)
GFR calc non Af Amer: 99 mL/min/{1.73_m2} (ref >59)
GLUCOSE: 86 mg/dL (ref 65–99)
Globulin, Total: 2.2 g/dL (ref 1.5–4.5)
Potassium: 4.3 mmol/L (ref 3.5–5.2)
Sodium: 140 mmol/L (ref 134–144)
Total Protein: 6.8 g/dL (ref 6.0–8.5)

## 2018-10-20 LAB — ANEMIA PROFILE B
Basophils Absolute: 0 10*3/uL (ref 0.0–0.2)
Basos: 1 %
EOS (ABSOLUTE): 0.1 10*3/uL (ref 0.0–0.4)
Eos: 2 %
Ferritin: 326 ng/mL (ref 30–400)
Folate: 3.3 ng/mL (ref >3.0)
HEMATOCRIT: 44 % (ref 37.5–51.0)
HEMOGLOBIN: 14.9 g/dL (ref 13.0–17.7)
IRON: 150 ug/dL (ref 38–169)
Immature Grans (Abs): 0 10*3/uL (ref 0.0–0.1)
Immature Granulocytes: 0 %
Iron Saturation: 68 % — ABNORMAL HIGH (ref 15–55)
Lymphocytes Absolute: 0.8 10*3/uL (ref 0.7–3.1)
Lymphs: 20 %
MCH: 32.2 pg (ref 26.6–33.0)
MCHC: 33.9 g/dL (ref 31.5–35.7)
MCV: 95 fL (ref 79–97)
Monocytes Absolute: 0.5 10*3/uL (ref 0.1–0.9)
Monocytes: 12 %
Neutrophils Absolute: 2.5 10*3/uL (ref 1.4–7.0)
Neutrophils: 65 %
Platelets: 206 10*3/uL (ref 150–450)
RBC: 4.63 x10E6/uL (ref 4.14–5.80)
RDW: 12.1 % (ref 11.6–15.4)
Retic Ct Pct: 1.1 % (ref 0.6–2.6)
Total Iron Binding Capacity: 219 ug/dL — ABNORMAL LOW (ref 250–450)
UIBC: 69 ug/dL — ABNORMAL LOW (ref 111–343)
Vitamin B-12: 329 pg/mL (ref 232–1245)
WBC: 3.8 10*3/uL (ref 3.4–10.8)

## 2018-10-20 LAB — LIPID PANEL
CHOL/HDL RATIO: 4.9 ratio (ref 0.0–5.0)
Cholesterol, Total: 265 mg/dL — ABNORMAL HIGH (ref 100–199)
HDL: 54 mg/dL (ref >39)
LDL Calculated: 188 mg/dL — ABNORMAL HIGH (ref 0–99)
TRIGLYCERIDES: 113 mg/dL (ref 0–149)
VLDL Cholesterol Cal: 23 mg/dL (ref 5–40)

## 2018-10-20 LAB — HIV ANTIBODY (ROUTINE TESTING W REFLEX): HIV Screen 4th Generation wRfx: NONREACTIVE

## 2018-10-22 ENCOUNTER — Telehealth: Payer: Self-pay | Admitting: Family Medicine

## 2018-10-23 NOTE — Telephone Encounter (Signed)
Noted on lab results

## 2018-10-25 ENCOUNTER — Ambulatory Visit (HOSPITAL_COMMUNITY): Payer: Self-pay | Admitting: Hematology

## 2018-11-09 ENCOUNTER — Encounter (HOSPITAL_COMMUNITY): Payer: Self-pay | Admitting: Hematology

## 2018-11-09 ENCOUNTER — Inpatient Hospital Stay (HOSPITAL_COMMUNITY): Payer: PRIVATE HEALTH INSURANCE | Attending: Hematology | Admitting: Hematology

## 2018-11-09 DIAGNOSIS — Z79899 Other long term (current) drug therapy: Secondary | ICD-10-CM | POA: Diagnosis not present

## 2018-11-09 DIAGNOSIS — R748 Abnormal levels of other serum enzymes: Secondary | ICD-10-CM

## 2018-11-09 DIAGNOSIS — Z87891 Personal history of nicotine dependence: Secondary | ICD-10-CM | POA: Diagnosis not present

## 2018-11-09 DIAGNOSIS — K5900 Constipation, unspecified: Secondary | ICD-10-CM

## 2018-11-09 DIAGNOSIS — Z7982 Long term (current) use of aspirin: Secondary | ICD-10-CM

## 2018-11-09 DIAGNOSIS — R7989 Other specified abnormal findings of blood chemistry: Secondary | ICD-10-CM | POA: Diagnosis not present

## 2018-11-09 NOTE — Patient Instructions (Signed)
[facility] at [facility] Discharge Instructions     Thank you for choosing [facility] at [facility] to provide your oncology and hematology care.  To afford each patient quality time with our provider, please arrive at least 15 minutes before your scheduled appointment time.   If you have a lab appointment with the Cancer Center please come in thru the  Main Entrance and check in at the main information desk  You need to re-schedule your appointment should you arrive 10 or more minutes late.  We strive to give you quality time with our providers, and arriving late affects you and other patients whose appointments are after yours.  Also, if you no show three or more times for appointments you may be dismissed from the clinic at the providers discretion.     Again, thank you for choosing [facility].  Our hope is that these requests will decrease the amount of time that you wait before being seen by our physicians.       _____________________________________________________________  Should you have questions after your visit to [facility], please contact our office at [phone] between the hours of 8:00 a.m. and 4:30 p.m.  Voicemails left after 4:00 p.m. will not be returned until the following business day.  For prescription refill requests, have your pharmacy contact our office and allow 72 hours.    Cancer Center Support Programs:   > Cancer Support Group  2nd Tuesday of the month 1pm-2pm, Journey Room    

## 2018-11-09 NOTE — Assessment & Plan Note (Signed)
1.  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 -He had a liver biopsy done on 01/08/2018 which showed well-preserved architecture, hepatic lobules with no significant steatosis, cholestasis or inflammation.  No pathological fibrosis.  Iron stain shows mostly mild to focally moderate granular iron deposition within hepatocytes (1-2+) of uncertain significance. -Not a regular drinker.  No family history of hemochromatosis on the mother side.  Does not know the family history on the father's side.  Hepatitis panel was negative.  Normal ceruloplasmin. - At last visit, because of evidence of iron overload in the liver, I have recommended phlebotomy with a goal of ferritin below 50.   -I have counseled him to eat a well-balanced diet and avoid iron and vitamin C supplements. -At last visit I have also arranged [facility].  But patient did not follow instructions. -I have reviewed his blood work from 10/19/2018 which shows elevated ferritin of 326, previously 287 on 08/08/2018.  Percent saturation was 68.  Hemoglobin was 14.9. -I have reiterated the fact that he needs phlebotomies to decrease his iron stores.  We have given the open days and times of [facility] [place]. - We will follow-up with her in 3 months with repeat blood work.  2.  Constipation: -He had colonoscopy on 10/08/2018 which showed anal fissure, inflammation of rectum and external and internal hemorrhoids. -He was encouraged to eat vegetables and fiber rich foods.

## 2018-11-09 NOTE — Progress Notes (Signed)
[facility] [address], [place] [address]   CLINIC:  Medical Oncology/Hematology  PCP:  Sonny Masters, FNP [address] [place] [address] [phone]   REASON FOR VISIT: Follow-up for hemochromatosis   CURRENT THERAPY: intermittent phlebotomies    INTERVAL HISTORY:  Jacob Eaton 38 y.o. male returns for routine follow-up for hemochromatosis. He reports he did not go for his phlebotomies in December due to being confused about where and when to go. We will help set this up for him to have the phlebotomies. He reports he does have problems with constipation and started using Miralax daily. Denies any headaches or vision changes. Denies any nausea, vomiting, or diarrhea. Denies any new pains. Had not noticed any recent bleeding such as epistaxis, hematuria or hematochezia. Denies recent chest pain on exertion, shortness of breath on minimal exertion, pre-syncopal episodes, or palpitations. Denies any numbness or tingling in hands or feet. Denies any recent fevers, infections, or recent hospitalizations. Patient reports appetite at 100% and energy level at 100%.    REVIEW OF SYSTEMS:  Review of Systems  Gastrointestinal: Positive for constipation.  All other systems reviewed and are negative.    PAST MEDICAL/SURGICAL HISTORY:  Past Medical History:  Diagnosis Date  . Chronic tension headaches   . Depression    Past Surgical History:  Procedure Laterality Date  . BIOPSY  10/08/2018   Procedure: BIOPSY;  Surgeon: West Bali, MD;  [facility];  Service: Endoscopy;;  sigmoid and rectal bx's  . COLONOSCOPY N/A 10/08/2018   Procedure: COLONOSCOPY;  Surgeon: West Bali, MD;  [facility];  Service: Endoscopy;  Laterality: N/A;  1:00pm  . ESOPHAGEAL ATRESIA REPAIR  1990     SOCIAL HISTORY:  Social History   Socioeconomic History  . Marital status: Single    Spouse name: Not on file  . Number of children: Not on  file  . Years of education: Not on file  . Highest education level: Not on file  Occupational History  . Not on file  Social Needs  . Financial resource strain: Not on file  . Food insecurity:    Worry: Not on file    Inability: Not on file  . Transportation needs:    Medical: Not on file    Non-medical: Not on file  Tobacco Use  . Smoking status: Former Smoker    Types: Cigarettes    Start date: 09/20/1999    Last attempt to quit: 09/19/2000    Years since quitting: 18.1  . Smokeless tobacco: Never Used  Substance and Sexual Activity  . Alcohol use: Yes    Comment:  'once in a blue moon"  . Drug use: No  . Sexual activity: Not on file  Lifestyle  . Physical activity:    Days per week: Not on file    Minutes per session: Not on file  . Stress: Not on file  Relationships  . Social connections:    Talks on phone: Not on file    Gets together: Not on file    Attends religious service: Not on file    Active member of club or organization: Not on file    Attends meetings of clubs or organizations: Not on file    Relationship status: Not on file  . Intimate partner violence:    Fear of current or ex partner: Not on file    Emotionally abused: Not on file  Physically abused: Not on file    Forced sexual activity: Not on file  Other Topics Concern  . Not on file  Social History Narrative  . Not on file    FAMILY HISTORY:  Family History  Problem Relation Age of Onset  . Seizures Mother   . Heart disease Father   . Cancer Father   . Asthma Sister   . Colon cancer Neg Hx   . Colon polyps Neg Hx     CURRENT MEDICATIONS:  Outpatient Encounter Medications as of 11/09/2018  Medication Sig  . amitriptyline (ELAVIL) 25 MG tablet Take 25 mg by mouth at bedtime.  Marland Kitchen aspirin-acetaminophen-caffeine (EXCEDRIN MIGRAINE) 250-250-65 MG tablet Take 2 tablets by mouth every 6 (six) hours as needed for headache.  . pantoprazole (PROTONIX) 40 MG tablet Take 1 tablet (40 mg total) by  mouth daily.  . rizatriptan (MAXALT) 10 MG tablet Take 1 tablet (10 mg total) by mouth as needed for migraine. May repeat in 2 hours if needed   No facility-administered encounter medications on file as of 11/09/2018.     ALLERGIES:  Allergies  Allergen Reactions  . Naproxen Hives     PHYSICAL EXAM:  ECOG Performance status: 1  Vitals:   11/09/18 1051  BP: 117/77  Pulse: 91  Resp: 18  Temp: (!) 97.5 F (36.4 C)  SpO2: 100%   Filed Weights   11/09/18 1051  Weight: 135 lb 3.2 oz (61.3 kg)    Physical Exam Constitutional:      Appearance: Normal appearance. He is normal weight.  Cardiovascular:     Rate and Rhythm: Normal rate and regular rhythm.     Heart sounds: Normal heart sounds.  Pulmonary:     Effort: Pulmonary effort is normal.     Breath sounds: Normal breath sounds.  Musculoskeletal: Normal range of motion.  Skin:    General: Skin is warm and dry.  Neurological:     Mental Status: He is alert and oriented to person, place, and time. Mental status is at baseline.  Psychiatric:        Mood and Affect: Mood normal.        Behavior: Behavior normal.        Thought Content: Thought content normal.        Judgment: Judgment normal.   No palpable hepatomegaly.   LABORATORY DATA:  I have reviewed the labs as listed.  CBC    Component Value Date/Time   WBC 3.8 10/19/2018 0847   WBC 5.5 01/08/2018 1134   RBC 4.63 10/19/2018 0847   RBC 4.66 01/08/2018 1134   HGB 14.9 10/19/2018 0847   HCT 44.0 10/19/2018 0847   PLT 206 10/19/2018 0847   MCV 95 10/19/2018 0847   MCH 32.2 10/19/2018 0847   MCH 32.8 01/08/2018 1134   MCHC 33.9 10/19/2018 0847   MCHC 34.7 01/08/2018 1134   RDW 12.1 10/19/2018 0847   LYMPHSABS 0.8 10/19/2018 0847   EOSABS 0.1 10/19/2018 0847   BASOSABS 0.0 10/19/2018 0847   CMP Latest Ref Rng & Units 10/19/2018 08/08/2018 12/05/2017  Glucose 65 - 99 mg/dL 86 - -  BUN 6 - 20 mg/dL 18 - -  Creatinine 3.66 - 1.27 mg/dL 4.40 - -  Sodium  347 - 144 mmol/L 140 - -  Potassium 3.5 - 5.2 mmol/L 4.3 - -  Chloride 96 - 106 mmol/L 104 - -  CO2 20 - 29 mmol/L 22 - -  Calcium 8.7 - 10.2 mg/dL  9.6 - -  Total Protein 6.0 - 8.5 g/dL 6.8 6.7 7.2  Total Bilirubin 0.0 - 1.2 mg/dL 0.5 0.6 0.8  Alkaline Phos 39 - 117 IU/L 243(H) - -  AST 0 - 40 IU/L 29 22 44(H)  ALT 0 - 44 IU/L 45(H) 38 82(H)       DIAGNOSTIC IMAGING:  I have independently reviewed the scans and discussed with the patient.   I have reviewed Jacob Budandi Skylene Deremer, NP's note and agree with the documentation.  I personally performed a face-to-face visit, made revisions and my assessment and plan is as follows.    ASSESSMENT & PLAN:   High hepatic iron concentration determined by biopsy of liver 1.  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 -He had a liver biopsy done on 01/08/2018 which showed well-preserved architecture, hepatic lobules with no significant steatosis, cholestasis or inflammation.  No pathological fibrosis.  Iron stain shows mostly mild to focally moderate granular iron deposition within hepatocytes (1-2+) of uncertain significance. -Not a regular drinker.  No family history of hemochromatosis on the mother side.  Does not know the family history on the father's side.  Hepatitis panel was negative.  Normal ceruloplasmin. - At last visit, because of evidence of iron overload in the liver, I have recommended phlebotomy with a goal of ferritin below 50.   -I have counseled him to eat a well-balanced diet and avoid iron and vitamin C supplements. -At last visit I have also arranged [facility]ies.  But patient did not follow instructions. -I have reviewed his blood work from 10/19/2018 which shows elevated ferritin of 326, previously 287 on 08/08/2018.  Percent saturation was 68.  Hemoglobin was 14.9. -I have reiterated the fact that he needs phlebotomies to  decrease his iron stores.  We have given the open days and times [facility] [place]. - We will follow-up with her in 3 months with repeat blood work.  2.  Constipation: -He had colonoscopy on 10/08/2018 which showed anal fissure, inflammation of rectum and external and internal hemorrhoids. -He was encouraged to eat vegetables and fiber rich foods.        Orders placed this encounter:  Orders Placed This Encounter  Procedures  . CBC with Differential/Platelet  . Comprehensive metabolic panel  . Ferritin  . Iron and TIBC  . Vitamin B12  . Folate      Doreatha MassedSreedhar Katragadda, MD [facility] [phone]

## 2018-12-18 NOTE — Progress Notes (Signed)
Referring Provider:  Primary Care Physician:  Sonny Masters, FNP  Primary GI:   Virtual Visit via Telephone Note Due to COVID-19, visit is conducted virtually and was requested by patient.   I connected with Jacob Eaton on 12/19/18 at  2:00 PM EDT by telephone and verified that I am speaking with the correct person using two identifiers.   I discussed the limitations, risks, security and privacy concerns of performing an evaluation and management service by telephone and the availability of in person appointments. I also discussed with the patient that there may be a patient responsible charge related to this service. The patient expressed understanding and agreed to proceed.  Chief Complaint  Patient presents with  . Follow-up  . Constipation    takes a few weeks for pt to have a bowel movement. he increased his fiber, drinks fluids, otc ex lax     History of Present Illness: Jacob Eaton is a 38 year old male with history of rectal bleeding, hemochromatosis, s/p colonoscopy Jan 2020 with external and internal hemorrhoids, anal fissure on perianal exam. He is followed by Dr. Ellin Saba with Hematology at [facility].   Today he states that after colonoscopy, took up to 3 weeks to have a BM. Took enema. Last BM about 5 days ago. Takes ex lax, drinks hot cocoa. More fiber in diet. Taking fiber supplement. Eating fiber bars. Linzess 72 mcg worked in past.   Has insurance but has no deductible. Paying cash price for medications.     Past Medical History:  Diagnosis Date  . Chronic tension headaches   . Depression      Past Surgical History:  Procedure Laterality Date  . BIOPSY  10/08/2018   Procedure: BIOPSY;  Surgeon: West Bali, MD;  [facility];  Service: Endoscopy;;  sigmoid and rectal bx's  . COLONOSCOPY N/A 10/08/2018   Dr. Darrick Penna: external and internal hemorrhoids, anal fissure  . ESOPHAGEAL ATRESIA REPAIR  1990     Current Meds   Medication Sig  . amitriptyline (ELAVIL) 25 MG tablet Take 25 mg by mouth at bedtime.  Marland Kitchen aspirin-acetaminophen-caffeine (EXCEDRIN MIGRAINE) 250-250-65 MG tablet Take 2 tablets by mouth every 6 (six) hours as needed for headache.  . pantoprazole (PROTONIX) 40 MG tablet Take 1 tablet (40 mg total) by mouth daily.  . rizatriptan (MAXALT) 10 MG tablet Take 1 tablet (10 mg total) by mouth as needed for migraine. May repeat in 2 hours if needed       Observations/Objective: No distress. Unable to perform physical exam due to telephone encounter. No video available.   Assessment and Plan: 38 year old male with constipation, previously responding well to Linzess but now unable to afford payment due to high deductible plan. Will attempt to obtain patient assistance for him, as Linzess 72 mcg worked the best for him. We will also provide samples once we receive more. In the interim, he is to take Miralax daily as needed, continue dietary/behavior modifications. Continue follow-up with Hematology due to hemochromatosis.   Follow Up Instructions: Will attempt to obtain patient assistance for Linzess Miralax daily as needed Return in 4-6 months    I discussed the assessment and treatment plan with the patient. The patient was provided an opportunity to ask questions and all were answered. The patient agreed with the plan and demonstrated an understanding of the instructions.   The patient was advised to call back or seek an in-person evaluation if the symptoms worsen or if  the condition fails to improve as anticipated.  I provided 10 minutes of non-face-to-face time during this encounter.  Gelene Mink, PhD, ANP-BC [facility]

## 2018-12-19 ENCOUNTER — Ambulatory Visit (INDEPENDENT_AMBULATORY_CARE_PROVIDER_SITE_OTHER): Payer: PRIVATE HEALTH INSURANCE | Admitting: Gastroenterology

## 2018-12-19 ENCOUNTER — Encounter: Payer: Self-pay | Admitting: Gastroenterology

## 2018-12-19 DIAGNOSIS — K5900 Constipation, unspecified: Secondary | ICD-10-CM | POA: Diagnosis not present

## 2018-12-19 NOTE — Patient Instructions (Signed)
Please fill out the patient assistance forms for Linzess.  We will try to obtain the Linzess samples for you.  In the meantime, take Miralax one capful each evening with full glass of water.   We will see you back in 4-6 months!  I enjoyed talking with you again today! As you know, I value our relationship and want to provide genuine, compassionate, and quality care. I welcome your feedback. If you receive a survey regarding your visit,  I greatly appreciate you taking time to fill this out. See you next time!  Gelene Mink, PhD, ANP-BC [facility]

## 2018-12-24 ENCOUNTER — Encounter: Payer: Self-pay | Admitting: Gastroenterology

## 2018-12-24 NOTE — Progress Notes (Signed)
cc'ed to pcp °

## 2019-01-17 ENCOUNTER — Ambulatory Visit (INDEPENDENT_AMBULATORY_CARE_PROVIDER_SITE_OTHER): Payer: PRIVATE HEALTH INSURANCE | Admitting: Family Medicine

## 2019-01-17 ENCOUNTER — Other Ambulatory Visit: Payer: Self-pay

## 2019-01-17 DIAGNOSIS — Z91199 Patient's noncompliance with other medical treatment and regimen due to unspecified reason: Secondary | ICD-10-CM

## 2019-01-17 DIAGNOSIS — Z5329 Procedure and treatment not carried out because of patient's decision for other reasons: Secondary | ICD-10-CM

## 2019-01-17 NOTE — Progress Notes (Signed)
Attempted to call pt for telephone visit x 3. No answer.  Kari Baars, FNP-C 01/17/2019 [phone]

## 2019-01-21 ENCOUNTER — Ambulatory Visit: Payer: PRIVATE HEALTH INSURANCE | Admitting: Gastroenterology

## 2019-01-21 ENCOUNTER — Other Ambulatory Visit: Payer: Self-pay | Admitting: Pediatrics

## 2019-01-31 ENCOUNTER — Other Ambulatory Visit: Payer: Self-pay

## 2019-01-31 ENCOUNTER — Ambulatory Visit (INDEPENDENT_AMBULATORY_CARE_PROVIDER_SITE_OTHER): Payer: PRIVATE HEALTH INSURANCE | Admitting: Family Medicine

## 2019-01-31 ENCOUNTER — Encounter: Payer: Self-pay | Admitting: Family Medicine

## 2019-01-31 DIAGNOSIS — G43709 Chronic migraine without aura, not intractable, without status migrainosus: Secondary | ICD-10-CM | POA: Diagnosis not present

## 2019-01-31 MED ORDER — RIZATRIPTAN BENZOATE 10 MG PO TABS: 10.0000 mg | ORAL_TABLET | ORAL | 2 refills | Status: DC | PRN

## 2019-01-31 NOTE — Progress Notes (Signed)
Virtual Visit via telephone Note Due to COVID-19, visit is conducted virtually and was requested by patient. This visit type was conducted due to national recommendations for restrictions regarding the COVID-19 Pandemic (e.g. social distancing) in an effort to limit this patient's exposure and mitigate transmission in our community. All issues noted in this document were discussed and addressed.  A physical exam was not performed with this format.   I connected with Jacob Eaton on 01/31/19 at 1345 by telephone and verified that I am speaking with the correct person using two identifiers. Jacob Eaton is currently located at home and family is currently with them during visit. The provider, Kari Baars, FNP is located in their office at time of visit.  I discussed the limitations, risks, security and privacy concerns of performing an evaluation and management service by telephone and the availability of in person appointments. I also discussed with the patient that there may be a patient responsible charge related to this service. The patient expressed understanding and agreed to proceed.  Subjective:  Patient ID: Jacob Eaton, male    DOB: [birth year], 38 y.o.   MRN: [id number]  Chief Complaint:  Migraine   HPI: Jacob Eaton is a 38 y.o. male presenting on 01/31/2019 for Migraine   Pt reports he is out of his Maxalt. States he has only had two migraines in the last several months. States he ran out when he had his last migraine. States the pain was throbbing and sharp, 8/10. Went away with abortive therapy. No aura.   Migraine   This is a chronic problem. The current episode started more than 1 year ago. The problem occurs intermittently. The problem has been waxing and waning. The pain is located in the right unilateral region. The pain does not radiate. The quality of the pain is described as throbbing and sharp. The pain is at a severity of 8/10. The pain is moderate. Pertinent  negatives include no abdominal pain, blurred vision, coughing, dizziness, fever, nausea, numbness, phonophobia, photophobia, seizures, visual change, vomiting or weakness. The symptoms are aggravated by activity. He has tried Excedrin, triptans and beta blockers for the symptoms. The treatment provided significant relief.     Relevant past medical, surgical, family, and social history reviewed and updated as indicated.  Allergies and medications reviewed and updated.   Past Medical History:  Diagnosis Date   Chronic tension headaches    Depression     Past Surgical History:  Procedure Laterality Date   BIOPSY  10/08/2018   Procedure: BIOPSY;  Surgeon: West Bali, MD;  [facility];  Service: Endoscopy;;  sigmoid and rectal bx's   COLONOSCOPY N/A 10/08/2018   Dr. Darrick Penna: external and internal hemorrhoids, anal fissure   ESOPHAGEAL ATRESIA REPAIR  1990    Social History   Socioeconomic History   Marital status: Single    Spouse name: Not on file   Number of children: Not on file   Years of education: Not on file   Highest education level: Not on file  Occupational History   Not on file  Social Needs   Financial resource strain: Not on file   Food insecurity:    Worry: Not on file    Inability: Not on file   Transportation needs:    Medical: Not on file    Non-medical: Not on file  Tobacco Use   Smoking status: Former Smoker    Types: Cigarettes    Start date: 09/20/1999  Last attempt to quit: 09/19/2000    Years since quitting: 18.3   Smokeless tobacco: Never Used  Substance and Sexual Activity   Alcohol use: Yes    Comment:  'once in a blue moon"   Drug use: No   Sexual activity: Not on file  Lifestyle   Physical activity:    Days per week: Not on file    Minutes per session: Not on file   Stress: Not on file  Relationships   Social connections:    Talks on phone: Not on file    Gets together: Not on file    Attends  religious service: Not on file    Active member of club or organization: Not on file    Attends meetings of clubs or organizations: Not on file    Relationship status: Not on file   Intimate partner violence:    Fear of current or ex partner: Not on file    Emotionally abused: Not on file    Physically abused: Not on file    Forced sexual activity: Not on file  Other Topics Concern   Not on file  Social History Narrative   Not on file    Outpatient Encounter Medications as of 01/31/2019  Medication Sig   amitriptyline (ELAVIL) 25 MG tablet Take 25 mg by mouth at bedtime.   aspirin-acetaminophen-caffeine (EXCEDRIN MIGRAINE) 250-250-65 MG tablet Take 2 tablets by mouth every 6 (six) hours as needed for headache.   pantoprazole (PROTONIX) 40 MG tablet Take 1 tablet (40 mg total) by mouth daily.   rizatriptan (MAXALT) 10 MG tablet Take 1 tablet (10 mg total) by mouth as needed for migraine. May repeat in 2 hours if needed   [DISCONTINUED] rizatriptan (MAXALT) 10 MG tablet Take 1 tablet (10 mg total) by mouth as needed for migraine. May repeat in 2 hours if needed   No facility-administered encounter medications on file as of 01/31/2019.     Allergies  Allergen Reactions   Naproxen Hives    Review of Systems  Constitutional: Negative for chills, fatigue and fever.  Eyes: Negative for blurred vision, photophobia and visual disturbance.  Respiratory: Negative for cough and shortness of breath.   Cardiovascular: Negative for chest pain, palpitations and leg swelling.  Gastrointestinal: Negative for abdominal pain, nausea and vomiting.  Neurological: Positive for headaches. Negative for dizziness, tremors, seizures, syncope, facial asymmetry, speech difficulty, weakness, light-headedness and numbness.  Psychiatric/Behavioral: Negative for confusion.  All other systems reviewed and are negative.        Observations/Objective: No vital signs or physical exam, this was a  telephone or virtual health encounter.  Pt alert and oriented, answers all questions appropriately, and able to speak in full sentences.    Assessment and Plan: Sevion was seen today for migraine.  Diagnoses and all orders for this visit:  Chronic migraine without aura without status migrainosus, not intractable Migraines well controlled with preventative medications. Does need abortive therapy a few times per month. Will refill abortive therapy today. Pt to make an appointment for a CPE.  -     rizatriptan (MAXALT) 10 MG tablet; Take 1 tablet (10 mg total) by mouth as needed for migraine. May repeat in 2 hours if needed     Follow Up Instructions: Return in about 4 weeks (around 02/28/2019), or if symptoms worsen or fail to improve, for headaches.    I discussed the assessment and treatment plan with the patient. The patient was provided an opportunity to  ask questions and all were answered. The patient agreed with the plan and demonstrated an understanding of the instructions.   The patient was advised to call back or seek an in-person evaluation if the symptoms worsen or if the condition fails to improve as anticipated.  The above assessment and management plan was discussed with the patient. The patient verbalized understanding of and has agreed to the management plan. Patient is aware to call the clinic if symptoms persist or worsen. Patient is aware when to return to the clinic for a follow-up visit. Patient educated on when it is appropriate to go to the emergency department.    I provided 15 minutes of non-face-to-face time during this encounter. The call started at 1345. The call ended at 1400. The other time was used for coordination of care.    Kari BaarsMichelle Juriel Cid, FNP-C [facility] [address] [place], [place] [address] [phone]

## 2019-02-26 ENCOUNTER — Inpatient Hospital Stay (HOSPITAL_COMMUNITY): Payer: PRIVATE HEALTH INSURANCE | Attending: Hematology

## 2019-02-26 ENCOUNTER — Other Ambulatory Visit: Payer: Self-pay

## 2019-02-26 LAB — CBC WITH DIFFERENTIAL/PLATELET
Abs Immature Granulocytes: 0.01 10*3/uL (ref 0.00–0.07)
Basophils Absolute: 0 10*3/uL (ref 0.0–0.1)
Basophils Relative: 1 %
Eosinophils Absolute: 0.2 10*3/uL (ref 0.0–0.5)
Eosinophils Relative: 4 %
HCT: 45.2 % (ref 39.0–52.0)
Hemoglobin: 15.3 g/dL (ref 13.0–17.0)
Immature Granulocytes: 0 %
Lymphocytes Relative: 21 %
Lymphs Abs: 0.9 10*3/uL (ref 0.7–4.0)
MCH: 32.7 pg (ref 26.0–34.0)
MCHC: 33.8 g/dL (ref 30.0–36.0)
MCV: 96.6 fL (ref 80.0–100.0)
Monocytes Absolute: 0.5 10*3/uL (ref 0.1–1.0)
Monocytes Relative: 13 %
Neutro Abs: 2.5 10*3/uL (ref 1.7–7.7)
Neutrophils Relative %: 61 %
Platelets: 191 10*3/uL (ref 150–400)
RBC: 4.68 MIL/uL (ref 4.22–5.81)
RDW: 12.8 % (ref 11.5–15.5)
WBC: 4.1 10*3/uL (ref 4.0–10.5)
nRBC: 0 % (ref 0.0–0.2)

## 2019-02-26 LAB — FOLATE: Folate: 9.4 ng/mL (ref >5.9)

## 2019-02-26 LAB — COMPREHENSIVE METABOLIC PANEL
ALT: 66 U/L — ABNORMAL HIGH (ref 0–44)
AST: 41 U/L (ref 15–41)
Albumin: 4.4 g/dL (ref 3.5–5.0)
Alkaline Phosphatase: 244 U/L — ABNORMAL HIGH (ref 38–126)
Anion gap: 9 (ref 5–15)
BUN: 8 mg/dL (ref 6–20)
CO2: 25 mmol/L (ref 22–32)
Calcium: 9.2 mg/dL (ref 8.9–10.3)
Chloride: 103 mmol/L (ref 98–111)
Creatinine, Ser: 1.01 mg/dL (ref 0.61–1.24)
GFR calc Af Amer: >60 mL/min (ref >60)
GFR calc non Af Amer: >60 mL/min (ref >60)
Glucose, Bld: 93 mg/dL (ref 70–99)
Potassium: 3.9 mmol/L (ref 3.5–5.1)
Sodium: 137 mmol/L (ref 135–145)
Total Bilirubin: 0.7 mg/dL (ref 0.3–1.2)
Total Protein: 7.2 g/dL (ref 6.5–8.1)

## 2019-02-26 LAB — IRON AND TIBC
Iron: 182 ug/dL (ref 45–182)
Saturation Ratios: 75 % — ABNORMAL HIGH (ref 17.9–39.5)
TIBC: 243 ug/dL — ABNORMAL LOW (ref 250–450)
UIBC: 61 ug/dL

## 2019-02-26 LAB — FERRITIN: Ferritin: 192 ng/mL (ref 24–336)

## 2019-02-26 LAB — VITAMIN B12: Vitamin B-12: 258 pg/mL (ref 180–914)

## 2019-03-04 ENCOUNTER — Encounter (HOSPITAL_COMMUNITY): Payer: Self-pay

## 2019-03-05 ENCOUNTER — Ambulatory Visit (HOSPITAL_COMMUNITY): Payer: PRIVATE HEALTH INSURANCE | Admitting: Hematology

## 2019-03-19 ENCOUNTER — Ambulatory Visit (HOSPITAL_COMMUNITY): Payer: PRIVATE HEALTH INSURANCE | Admitting: Nurse Practitioner

## 2019-03-19 ENCOUNTER — Inpatient Hospital Stay (HOSPITAL_COMMUNITY): Payer: PRIVATE HEALTH INSURANCE

## 2019-03-19 ENCOUNTER — Other Ambulatory Visit (HOSPITAL_COMMUNITY): Payer: Self-pay | Admitting: Nurse Practitioner

## 2019-03-19 ENCOUNTER — Other Ambulatory Visit: Payer: Self-pay

## 2019-03-19 LAB — CBC WITH DIFFERENTIAL/PLATELET
Abs Immature Granulocytes: 0.02 10*3/uL (ref 0.00–0.07)
Basophils Absolute: 0 10*3/uL (ref 0.0–0.1)
Basophils Relative: 1 %
Eosinophils Absolute: 0.2 10*3/uL (ref 0.0–0.5)
Eosinophils Relative: 4 %
HCT: 43.3 % (ref 39.0–52.0)
Hemoglobin: 14.5 g/dL (ref 13.0–17.0)
Immature Granulocytes: 0 %
Lymphocytes Relative: 18 %
Lymphs Abs: 1 10*3/uL (ref 0.7–4.0)
MCH: 33.1 pg (ref 26.0–34.0)
MCHC: 33.5 g/dL (ref 30.0–36.0)
MCV: 98.9 fL (ref 80.0–100.0)
Monocytes Absolute: 0.5 10*3/uL (ref 0.1–1.0)
Monocytes Relative: 9 %
Neutro Abs: 3.8 10*3/uL (ref 1.7–7.7)
Neutrophils Relative %: 68 %
Platelets: 240 10*3/uL (ref 150–400)
RBC: 4.38 MIL/uL (ref 4.22–5.81)
RDW: 13.4 % (ref 11.5–15.5)
WBC: 5.5 10*3/uL (ref 4.0–10.5)
nRBC: 0 % (ref 0.0–0.2)

## 2019-03-19 LAB — COMPREHENSIVE METABOLIC PANEL
ALT: 76 U/L — ABNORMAL HIGH (ref 0–44)
AST: 49 U/L — ABNORMAL HIGH (ref 15–41)
Albumin: 4.6 g/dL (ref 3.5–5.0)
Alkaline Phosphatase: 252 U/L — ABNORMAL HIGH (ref 38–126)
Anion gap: 10 (ref 5–15)
BUN: 10 mg/dL (ref 6–20)
CO2: 25 mmol/L (ref 22–32)
Calcium: 9.2 mg/dL (ref 8.9–10.3)
Chloride: 101 mmol/L (ref 98–111)
Creatinine, Ser: 1.03 mg/dL (ref 0.61–1.24)
GFR calc Af Amer: >60 mL/min (ref >60)
GFR calc non Af Amer: >60 mL/min (ref >60)
Glucose, Bld: 103 mg/dL — ABNORMAL HIGH (ref 70–99)
Potassium: 3.8 mmol/L (ref 3.5–5.1)
Sodium: 136 mmol/L (ref 135–145)
Total Bilirubin: 0.5 mg/dL (ref 0.3–1.2)
Total Protein: 7.5 g/dL (ref 6.5–8.1)

## 2019-03-19 LAB — VITAMIN B12: Vitamin B-12: 263 pg/mL (ref 180–914)

## 2019-03-19 LAB — LACTATE DEHYDROGENASE: LDH: 117 U/L (ref 98–192)

## 2019-03-19 LAB — FOLATE: Folate: 6.9 ng/mL (ref >5.9)

## 2019-03-19 LAB — IRON AND TIBC
Iron: 170 ug/dL (ref 45–182)
Saturation Ratios: 67 % — ABNORMAL HIGH (ref 17.9–39.5)
TIBC: 253 ug/dL (ref 250–450)
UIBC: 83 ug/dL

## 2019-03-19 LAB — FERRITIN: Ferritin: 180 ng/mL (ref 24–336)

## 2019-03-20 ENCOUNTER — Other Ambulatory Visit: Payer: Self-pay

## 2019-03-20 LAB — VITAMIN D 25 HYDROXY (VIT D DEFICIENCY, FRACTURES): Vit D, 25-Hydroxy: 18.5 ng/mL — ABNORMAL LOW (ref 30.0–100.0)

## 2019-03-21 ENCOUNTER — Inpatient Hospital Stay (HOSPITAL_COMMUNITY): Payer: PRIVATE HEALTH INSURANCE | Attending: Nurse Practitioner | Admitting: Nurse Practitioner

## 2019-03-21 DIAGNOSIS — Z79899 Other long term (current) drug therapy: Secondary | ICD-10-CM | POA: Diagnosis not present

## 2019-03-21 DIAGNOSIS — Z87891 Personal history of nicotine dependence: Secondary | ICD-10-CM

## 2019-03-21 DIAGNOSIS — R748 Abnormal levels of other serum enzymes: Secondary | ICD-10-CM

## 2019-03-21 DIAGNOSIS — Z7982 Long term (current) use of aspirin: Secondary | ICD-10-CM

## 2019-03-21 DIAGNOSIS — Z8249 Family history of ischemic heart disease and other diseases of the circulatory system: Secondary | ICD-10-CM

## 2019-03-21 NOTE — Progress Notes (Signed)
[facility] [address] [place], [place] [address]   CLINIC:  Medical Oncology/Hematology  PCP:  Sonny MastersRakes, Linda M, FNP [address] [place] [id number] [phone]   REASON FOR VISIT: Follow-up for hemochromatosis  CURRENT THERAPY:intermittent phlebotomies     INTERVAL HISTORY:  Jacob Eaton 38 y.o. male returns for routine follow-up for hemochromatosis.  He reports he has been feeling great since his last phlebotomy which was last week.  He denies any headaches or vision changes. Denies any nausea, vomiting, or diarrhea. Denies any new pains. Had not noticed any recent bleeding such as epistaxis, hematuria or hematochezia. Denies recent chest pain on exertion, shortness of breath on minimal exertion, pre-syncopal episodes, or palpitations. Denies any numbness or tingling in hands or feet. Denies any recent fevers, infections, or recent hospitalizations. Patient reports appetite at 75% and energy level at 100%. He is eating well and maintaining his weight at this time.     REVIEW OF SYSTEMS:  Review of Systems  All other systems reviewed and are negative.    PAST MEDICAL/SURGICAL HISTORY:  Past Medical History:  Diagnosis Date  . Chronic tension headaches   . Depression    Past Surgical History:  Procedure Laterality Date  . BIOPSY  10/08/2018   Procedure: BIOPSY;  Surgeon: West BaliFields, Sandi L, MD;  [facility];  Service: Endoscopy;;  sigmoid and rectal bx's  . COLONOSCOPY N/A 10/08/2018   Dr. Darrick PennaFields: external and internal hemorrhoids, anal fissure  . ESOPHAGEAL ATRESIA REPAIR  1990     SOCIAL HISTORY:  Social History   Socioeconomic History  . Marital status: Single    Spouse name: Not on file  . Number of children: Not on file  . Years of education: Not on file  . Highest education level: Not on file  Occupational History  . Not on file  Social Needs  . Financial resource strain: Not on file  . Food insecurity    Worry: Not on  file    Inability: Not on file  . Transportation needs    Medical: Not on file    Non-medical: Not on file  Tobacco Use  . Smoking status: Former Smoker    Types: Cigarettes    Start date: 09/20/1999    Quit date: 09/19/2000    Years since quitting: 18.5  . Smokeless tobacco: Never Used  Substance and Sexual Activity  . Alcohol use: Yes    Comment:  'once in a blue moon"  . Drug use: No  . Sexual activity: Not on file  Lifestyle  . Physical activity    Days per week: Not on file    Minutes per session: Not on file  . Stress: Not on file  Relationships  . Social Musicianconnections    Talks on phone: Not on file    Gets together: Not on file    Attends religious service: Not on file    Active member of club or organization: Not on file    Attends meetings of clubs or organizations: Not on file    Relationship status: Not on file  . Intimate partner violence    Fear of current or ex partner: Not on file    Emotionally abused: Not on file    Physically abused: Not on file    Forced sexual activity: Not on file  Other Topics Concern  . Not on file  Social History Narrative  . Not on file    FAMILY HISTORY:  Family History  Problem Relation Age of Onset  . Seizures Mother   . Heart disease Father   . Cancer Father   . Asthma Sister   . Colon cancer Neg Hx   . Colon polyps Neg Hx     CURRENT MEDICATIONS:  Outpatient Encounter Medications as of 03/21/2019  Medication Sig  . amitriptyline (ELAVIL) 25 MG tablet Take 25 mg by mouth at bedtime.  Marland Kitchen. aspirin-acetaminophen-caffeine (EXCEDRIN MIGRAINE) 250-250-65 MG tablet Take 2 tablets by mouth every 6 (six) hours as needed for headache.  . pantoprazole (PROTONIX) 40 MG tablet Take 1 tablet (40 mg total) by mouth daily.  . rizatriptan (MAXALT) 10 MG tablet Take 1 tablet (10 mg total) by mouth as needed for migraine. May repeat in 2 hours if needed   No facility-administered encounter medications on file as of 03/21/2019.      ALLERGIES:  Allergies  Allergen Reactions  . Naproxen Hives     PHYSICAL EXAM:  ECOG Performance status: 1  Vitals:   03/21/19 1100  BP: 114/80  Pulse: (!) 105  Resp: 20  Temp: 99.1 F (37.3 C)  SpO2: 99%   Filed Weights   03/21/19 1100  Weight: 143 lb (64.9 kg)    Physical Exam Constitutional:      Appearance: Normal appearance. He is normal weight.  Cardiovascular:     Rate and Rhythm: Normal rate and regular rhythm.     Heart sounds: Normal heart sounds.  Pulmonary:     Effort: Pulmonary effort is normal.     Breath sounds: Normal breath sounds.  Abdominal:     General: Bowel sounds are normal.     Palpations: Abdomen is soft.  Musculoskeletal: Normal range of motion.  Skin:    General: Skin is warm and dry.  Neurological:     Mental Status: He is alert and oriented to person, place, and time. Mental status is at baseline.  Psychiatric:        Mood and Affect: Mood normal.        Behavior: Behavior normal.        Thought Content: Thought content normal.        Judgment: Judgment normal.      LABORATORY DATA:  I have reviewed the labs as listed.  CBC    Component Value Date/Time   WBC 5.5 03/19/2019 1505   RBC 4.38 03/19/2019 1505   HGB 14.5 03/19/2019 1505   HGB 14.9 10/19/2018 0847   HCT 43.3 03/19/2019 1505   HCT 44.0 10/19/2018 0847   PLT 240 03/19/2019 1505   PLT 206 10/19/2018 0847   MCV 98.9 03/19/2019 1505   MCV 95 10/19/2018 0847   MCH 33.1 03/19/2019 1505   MCHC 33.5 03/19/2019 1505   RDW 13.4 03/19/2019 1505   RDW 12.1 10/19/2018 0847   LYMPHSABS 1.0 03/19/2019 1505   LYMPHSABS 0.8 10/19/2018 0847   MONOABS 0.5 03/19/2019 1505   EOSABS 0.2 03/19/2019 1505   EOSABS 0.1 10/19/2018 0847   BASOSABS 0.0 03/19/2019 1505   BASOSABS 0.0 10/19/2018 0847   CMP Latest Ref Rng & Units 03/19/2019 02/26/2019 10/19/2018  Glucose 70 - 99 mg/dL 161(W103(H) 93 86  BUN 6 - 20 mg/dL 10 8 18   Creatinine 0.61 - 1.24 mg/dL 9.601.03 4.541.01 0.980.97  Sodium 135 -  145 mmol/L 136 137 140  Potassium 3.5 - 5.1 mmol/L 3.8 3.9 4.3  Chloride 98 - 111 mmol/L 101 103 104  CO2 22 - 32 mmol/L 25 25  22  Calcium 8.9 - 10.3 mg/dL 9.2 9.2 9.6  Total Protein 6.5 - 8.1 g/dL 7.5 7.2 6.8  Total Bilirubin 0.3 - 1.2 mg/dL 0.5 0.7 0.5  Alkaline Phos 38 - 126 U/L 252(H) 244(H) 243(H)  AST 15 - 41 U/L 49(H) 41 29  ALT 0 - 44 U/L 76(H) 66(H) 45(H)     I personally performed a face-to-face visit.  All questions were answered to patient's stated satisfaction. Encouraged patient to call with any new concerns or questions before his next visit to the cancer center and we can certain see him sooner, if needed.     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 - He had a liver biopsy done on 01/08/2018 which showed well-preserved architecture, hepatic lobules with no significant steatosis, cholestasis or inflammation.  No pathological fibrosis.  Iron stain shows mostly mild to focal moderate granular iron disposition with hepatocytes (3-5+) of uncertain significance. -Not a regular drinker.  No family history of hemochromatosis on the mother side.  Does not know the family history on the father's side.  Hepatitis panel was negative.  Normal ceruloplasmin.-  He has been counseled about well-balanced diet and avoid iron and vitamin C supplements. - Patient reports he had his last phlebotomy last week.  He reports he has been getting phlebotomies every 8 weeks at [facility] in [place] on 03/19/2019 showed his hemoglobin 14.4, ferritin 180, percent saturation 67. -Goal is to keep ferritin is close to 50 as possible. -He will follow-up in 4 months with repeat labs.      Orders placed this encounter:  Orders Placed This Encounter  Procedures  . CBC with Differential/Platelet  . Comprehensive metabolic panel  .  Ferritin  . Iron and TIBC  . Vitamin B12  . VITAMIN D 25 Hydroxy (Vit-D Deficiency, Fractures)      Francene Finders, FNP-C [place] [phone]

## 2019-03-21 NOTE — Patient Instructions (Signed)
[place] at [facility] Discharge Instructions  Follow up for 4 months with labs    Thank you for choosing [place] at [facility] to provide your oncology and hematology care.  To afford each patient quality time with our provider, please arrive at least 15 minutes before your scheduled appointment time.   If you have a lab appointment with the [facility] please come in thru the  Main Entrance and check in at the main information desk  You need to re-schedule your appointment should you arrive 10 or more minutes late.  We strive to give you quality time with our providers, and arriving late affects you and other patients whose appointments are after yours.  Also, if you no show three or more times for appointments you may be dismissed from the clinic at the providers discretion.     Again, thank you for choosing [facility].  Our hope is that these requests will decrease the amount of time that you wait before being seen by our physicians.       _____________________________________________________________  Should you have questions after your visit to [facility], please contact our office at [phone] between the hours of 8:00 a.m. and 4:30 p.m.  Voicemails left after 4:00 p.m. will not be returned until the following business day.  For prescription refill requests, have your pharmacy contact our office and allow 72 hours.    Cancer Center Support Programs:   > Cancer Support Group  2nd Tuesday of the month 1pm-2pm, Journey Room

## 2019-03-21 NOTE — Assessment & Plan Note (Addendum)
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 - He had a liver biopsy done on 01/08/2018 which showed well-preserved architecture, hepatic lobules with no significant steatosis, cholestasis or inflammation.  No pathological fibrosis.  Iron stain shows mostly mild to focal moderate granular iron disposition with hepatocytes (1-6+) of uncertain significance. -Not a regular drinker.  No family history of hemochromatosis on the mother side.  Does not know the family history on the father's side.  Hepatitis panel was negative.  Normal ceruloplasmin.-  He has been counseled about well-balanced diet and avoid iron and vitamin C supplements. - Patient reports he had his last phlebotomy last week.  He reports he has been getting phlebotomies every 8 weeks at [facility] in [place] on 03/19/2019 showed his hemoglobin 14.4, ferritin 180, percent saturation 67. -Goal is to keep ferritin is close to 50 as possible. -He will follow-up in 4 months with repeat labs.

## 2019-04-04 ENCOUNTER — Other Ambulatory Visit: Payer: Self-pay | Admitting: Family Medicine

## 2019-04-04 NOTE — Telephone Encounter (Signed)
Amitriptyline last filled by Roseanne Kaufman.  Patient aware he will need to be seen for a refill on this medication

## 2019-04-05 ENCOUNTER — Other Ambulatory Visit: Payer: Self-pay | Admitting: Gastroenterology

## 2019-04-11 ENCOUNTER — Other Ambulatory Visit: Payer: Self-pay

## 2019-04-11 ENCOUNTER — Ambulatory Visit (INDEPENDENT_AMBULATORY_CARE_PROVIDER_SITE_OTHER): Payer: PRIVATE HEALTH INSURANCE | Admitting: Family Medicine

## 2019-04-11 ENCOUNTER — Encounter: Payer: Self-pay | Admitting: Family Medicine

## 2019-04-11 DIAGNOSIS — G43719 Chronic migraine without aura, intractable, without status migrainosus: Secondary | ICD-10-CM | POA: Diagnosis not present

## 2019-04-11 MED ORDER — AMITRIPTYLINE HCL 25 MG PO TABS: 25.0000 mg | ORAL_TABLET | Freq: Every day | ORAL | 0 refills | Status: DC

## 2019-04-11 NOTE — Progress Notes (Signed)
Virtual Visit via telephone Note Due to COVID-19 pandemic this visit was conducted virtually. This visit type was conducted due to national recommendations for restrictions regarding the COVID-19 Pandemic (e.g. social distancing, sheltering in place) in an effort to limit this patient's exposure and mitigate transmission in our community. All issues noted in this document were discussed and addressed.  A physical exam was not performed with this format.   I connected with Jacob CoriaJeffrey Bryk on 04/11/19 at 0915 by telephone and verified that I am speaking with the correct person using two identifiers. Jacob CoriaJeffrey Clippinger is currently located at work and no one is currently with them during visit. The provider, Kari BaarsMichelle , FNP is located in their office at time of visit.  I discussed the limitations, risks, security and privacy concerns of performing an evaluation and management service by telephone and the availability of in person appointments. I also discussed with the patient that there may be a patient responsible charge related to this service. The patient expressed understanding and agreed to proceed.  Subjective:  Patient ID: Jacob CoriaJeffrey Slater, male    DOB: 09/01/1981, 38 y.o.   MRN: [id number]  Chief Complaint:  Headache   HPI: Jacob Eaton is a 38 y.o. male presenting on 04/11/2019 for Headache   Pt following up today for a refill of his Elavil for preventative migraine treatment. Pt states he has been out of this for a few days and his headaches are getting worse. States he has been taking this at night for a while and it has been very beneficial for his headaches.   Headache  This is a chronic problem. The current episode started more than 1 year ago. The problem occurs every few minutes. The problem has been waxing and waning. The pain quality is similar to prior headaches. The pain is at a severity of 5/10. The pain is mild. Associated symptoms include anorexia, insomnia, nausea,  phonophobia and photophobia. Pertinent negatives include no abdominal pain, abnormal behavior, back pain, blurred vision, coughing, dizziness, drainage, ear pain, eye pain, eye redness, eye watering, facial sweating, fever, hearing loss, loss of balance, muscle aches, neck pain, numbness, rhinorrhea, scalp tenderness, seizures, sinus pressure, sore throat, swollen glands, tingling, tinnitus, visual change, vomiting, weakness or weight loss. The symptoms are aggravated by bright light, work and emotional stress. He has tried triptans, acetaminophen, Excedrin and darkened room for the symptoms. The treatment provided significant relief.     Relevant past medical, surgical, family, and social history reviewed and updated as indicated.  Allergies and medications reviewed and updated.   Past Medical History:  Diagnosis Date  . Chronic tension headaches   . Depression     Past Surgical History:  Procedure Laterality Date  . BIOPSY  10/08/2018   Procedure: BIOPSY;  Surgeon: West BaliFields, Sandi L, MD;  Location: [facility];  Service: Endoscopy;;  sigmoid and rectal bx's  . COLONOSCOPY N/A 10/08/2018   Dr. Darrick PennaFields: external and internal hemorrhoids, anal fissure  . ESOPHAGEAL ATRESIA REPAIR  1990    Social History   Socioeconomic History  . Marital status: Single    Spouse name: Not on file  . Number of children: Not on file  . Years of education: Not on file  . Highest education level: Not on file  Occupational History  . Not on file  Social Needs  . Financial resource strain: Not on file  . Food insecurity    Worry: Not on file    Inability: Not on file  .  Transportation needs    Medical: Not on file    Non-medical: Not on file  Tobacco Use  . Smoking status: Former Smoker    Types: Cigarettes    Start date: 09/20/1999    Quit date: 09/19/2000    Years since quitting: 18.5  . Smokeless tobacco: Never Used  Substance and Sexual Activity  . Alcohol use: Yes    Comment:  'once in a blue  moon"  . Drug use: No  . Sexual activity: Not on file  Lifestyle  . Physical activity    Days per week: Not on file    Minutes per session: Not on file  . Stress: Not on file  Relationships  . Social Herbalist on phone: Not on file    Gets together: Not on file    Attends religious service: Not on file    Active member of club or organization: Not on file    Attends meetings of clubs or organizations: Not on file    Relationship status: Not on file  . Intimate partner violence    Fear of current or ex partner: Not on file    Emotionally abused: Not on file    Physically abused: Not on file    Forced sexual activity: Not on file  Other Topics Concern  . Not on file  Social History Narrative  . Not on file    Outpatient Encounter Medications as of 04/11/2019  Medication Sig  . amitriptyline (ELAVIL) 25 MG tablet Take 1 tablet (25 mg total) by mouth at bedtime.  Marland Kitchen aspirin-acetaminophen-caffeine (EXCEDRIN MIGRAINE) 250-250-65 MG tablet Take 2 tablets by mouth every 6 (six) hours as needed for headache.  . pantoprazole (PROTONIX) 40 MG tablet TAKE 1 TABLET BY MOUTH EVERY DAY  . rizatriptan (MAXALT) 10 MG tablet Take 1 tablet (10 mg total) by mouth as needed for migraine. May repeat in 2 hours if needed  . [DISCONTINUED] amitriptyline (ELAVIL) 25 MG tablet Take 25 mg by mouth at bedtime.   No facility-administered encounter medications on file as of 04/11/2019.     Allergies  Allergen Reactions  . Naproxen Hives    Review of Systems  Constitutional: Positive for appetite change. Negative for activity change, chills, diaphoresis, fatigue, fever, unexpected weight change and weight loss.  HENT: Negative for ear pain, hearing loss, rhinorrhea, sinus pressure, sore throat and tinnitus.   Eyes: Positive for photophobia. Negative for blurred vision, pain and redness.  Respiratory: Negative for cough, chest tightness and shortness of breath.   Cardiovascular: Negative for  chest pain and palpitations.  Gastrointestinal: Positive for anorexia and nausea. Negative for abdominal pain and vomiting.  Genitourinary: Negative for decreased urine volume and difficulty urinating.  Musculoskeletal: Negative for back pain and neck pain.  Neurological: Positive for headaches. Negative for dizziness, tingling, tremors, seizures, syncope, facial asymmetry, speech difficulty, weakness, light-headedness, numbness and loss of balance.  Psychiatric/Behavioral: Positive for sleep disturbance. Negative for confusion. The patient has insomnia.   All other systems reviewed and are negative.        Observations/Objective: No vital signs or physical exam, this was a telephone or virtual health encounter.  Pt alert and oriented, answers all questions appropriately, and able to speak in full sentences.    Assessment and Plan: Cullin was seen today for headache.  Diagnoses and all orders for this visit:  Intractable chronic migraine without aura and without status migrainosus Pt needing refill on preventative migraine medications. Pt compliant with  medications without adverse reactions. Will refill today. Follow up in 1 month for reevaluation.  -     amitriptyline (ELAVIL) 25 MG tablet; Take 1 tablet (25 mg total) by mouth at bedtime.     Follow Up Instructions: Return if symptoms worsen or fail to improve.    I discussed the assessment and treatment plan with the patient. The patient was provided an opportunity to ask questions and all were answered. The patient agreed with the plan and demonstrated an understanding of the instructions.   The patient was advised to call back or seek an in-person evaluation if the symptoms worsen or if the condition fails to improve as anticipated.  The above assessment and management plan was discussed with the patient. The patient verbalized understanding of and has agreed to the management plan. Patient is aware to call the clinic if  symptoms persist or worsen. Patient is aware when to return to the clinic for a follow-up visit. Patient educated on when it is appropriate to go to the emergency department.    I provided 15 minutes of non-face-to-face time during this encounter. The call started at 0915. The call ended at 0930. The other time was used for coordination of care.    Kari BaarsMichelle , FNP-C [facility] [address] [place], [place] [address] [phone] 04/11/19

## 2019-04-29 NOTE — Progress Notes (Signed)
Primary Care Physician:  Baruch Gouty, FNP Primary GI Physician: Dr. Oneida Alar  Chief Complaint  Patient presents with  . Constipation    better    HPI:   Jacob Eaton is a 38 y.o. male presenting today with a history of GERD, hemochromatosis followed by Dr. Delton Coombes with hematology/oncology. Patient was last seen on 12/19/18 for follow up after TCS on Jan 2020 for rectal bleeding. Colonoscopy revealed internal and external hemorrhoids, anal fissure on perianal exam. At this visit, he was having constipation. Patient was no longer able to afford Linzess 72 mcg. Plans to attempt patient assistance. Use MiraLAX daily as needed in the interim.   Today he states his constipation is doing better. No insurance until 90 days at his job. Has been working 1 month. Taking Prunelax, 2 every day. Has a BM about twice a week. States he is happy with this. No straining. Stools are soft and formed. No diarrhea. No rectal pain. Occasional hematochezia. Some blood in the stool and on the toilet tissue. Maybe once a month.  Occasional itching before having a BM that resolves after BM. No prolapsing hemorrhoid tissue. States he tried the preparation H after his colonoscopy as recommended, but didn't like using it. No melena.  No abdominal pain. Appetite is up and down. If it is hot, he doesn't get really hungry. States he has been like this all his life. No weight loss. Takes protonix daily which keeps GERD symptoms under control. No breakthrough. No N/V or dysphagia.    Dizziness on and off for a while. Happens when he gets up too quick. States he has a cough. Started about 1 month ago. No shortness of breath. Productive at times with yellow phlegm. Not worsening. No fever, chills, or fatigue. Has allergies, but no nasal congestion at this time. Reports COVID exposure 4 months ago. Advised he follow-up with PCP on this.     Past Medical History:  Diagnosis Date  . Chronic tension headaches   . Depression    . GERD (gastroesophageal reflux disease)   . Hemochromatosis     Past Surgical History:  Procedure Laterality Date  . BIOPSY  10/08/2018   Procedure: BIOPSY;  Surgeon: Danie Binder, MD;  Location: [facility];  Service: Endoscopy;;  sigmoid and rectal bx's  . COLONOSCOPY N/A 10/08/2018   Dr. Oneida Alar: external and internal hemorrhoids, anal fissure  . ESOPHAGEAL ATRESIA REPAIR  1990    Current Outpatient Medications  Medication Sig Dispense Refill  . amitriptyline (ELAVIL) 25 MG tablet Take 1 tablet (25 mg total) by mouth at bedtime. 30 tablet 0  . aspirin-acetaminophen-caffeine (EXCEDRIN MIGRAINE) 250-250-65 MG tablet Take 2 tablets by mouth every 6 (six) hours as needed for headache.    Marland Kitchen OVER THE COUNTER MEDICATION Prune tablets takes 2 tablets daily    . pantoprazole (PROTONIX) 40 MG tablet TAKE 1 TABLET BY MOUTH EVERY DAY 90 tablet 1  . rizatriptan (MAXALT) 10 MG tablet Take 1 tablet (10 mg total) by mouth as needed for migraine. May repeat in 2 hours if needed 10 tablet 2   No current facility-administered medications for this visit.     Allergies as of 04/30/2019 - Review Complete 04/30/2019  Allergen Reaction Noted  . Naproxen Hives 04/27/2017    Family History  Problem Relation Age of Onset  . Seizures Mother   . Heart disease Father   . Cancer Father   . Asthma Sister   . Colon cancer Neg  Hx   . Colon polyps Neg Hx     Social History   Socioeconomic History  . Marital status: Single    Spouse name: Not on file  . Number of children: Not on file  . Years of education: Not on file  . Highest education level: Not on file  Occupational History  . Not on file  Social Needs  . Financial resource strain: Not on file  . Food insecurity    Worry: Not on file    Inability: Not on file  . Transportation needs    Medical: Not on file    Non-medical: Not on file  Tobacco Use  . Smoking status: Former Smoker    Types: Cigarettes    Start date: 09/20/1999     Quit date: 09/19/2000    Years since quitting: 18.6  . Smokeless tobacco: Never Used  Substance and Sexual Activity  . Alcohol use: Yes    Comment:  'once in a blue moon"  . Drug use: No  . Sexual activity: Not on file  Lifestyle  . Physical activity    Days per week: Not on file    Minutes per session: Not on file  . Stress: Not on file  Relationships  . Social Musicianconnections    Talks on phone: Not on file    Gets together: Not on file    Attends religious service: Not on file    Active member of club or organization: Not on file    Attends meetings of clubs or organizations: Not on file    Relationship status: Not on file  Other Topics Concern  . Not on file  Social History Narrative  . Not on file    Review of Systems: Gen: See HPI.  CV: Denies chest pain, palpitations, syncope, peripheral edema. Resp: See HPI  GI: See HPI Derm: Denies rash, itching, dry skin Psych: Denies depression, anxiety Heme: Denies bruising, bleeding  Physical Exam: BP 119/83   Pulse 91   Temp (!) 97.1 F (36.2 C) (Temporal)   Ht 6' (1.829 m)   Wt 145 lb (65.8 kg)   BMI 19.67 kg/m  General:   Alert and oriented. No distress noted. Pleasant and cooperative.  Head:  Normocephalic and atraumatic. Eyes:  Conjuctiva clear without scleral icterus. Heart:  S1, S2 present without murmurs appreciated. Lungs:  Clear to auscultation bilaterally. No wheezes, rales, or rhonchi. No distress.  Abdomen:  +BS, soft, non-tender and non-distended. No rebound or guarding. No HSM or masses noted. Small umbilical hernia noted.  Msk:  Symmetrical without gross deformities. Normal posture. Extremities:  Without edema. Neurologic:  Alert and  oriented x4 Psych: Normal mood and affect.

## 2019-04-30 ENCOUNTER — Encounter: Payer: Self-pay | Admitting: Gastroenterology

## 2019-04-30 ENCOUNTER — Other Ambulatory Visit: Payer: Self-pay

## 2019-04-30 ENCOUNTER — Ambulatory Visit: Payer: PRIVATE HEALTH INSURANCE | Admitting: Gastroenterology

## 2019-04-30 ENCOUNTER — Ambulatory Visit (INDEPENDENT_AMBULATORY_CARE_PROVIDER_SITE_OTHER): Payer: PRIVATE HEALTH INSURANCE | Admitting: Gastroenterology

## 2019-04-30 VITALS — BP 119/83 | HR 91 | Temp 97.1°F | Ht 72.0 in | Wt 145.0 lb

## 2019-04-30 DIAGNOSIS — K5900 Constipation, unspecified: Secondary | ICD-10-CM

## 2019-04-30 DIAGNOSIS — K219 Gastro-esophageal reflux disease without esophagitis: Secondary | ICD-10-CM

## 2019-04-30 DIAGNOSIS — R05 Cough: Secondary | ICD-10-CM | POA: Diagnosis not present

## 2019-04-30 DIAGNOSIS — R059 Cough, unspecified: Secondary | ICD-10-CM | POA: Insufficient documentation

## 2019-04-30 NOTE — Assessment & Plan Note (Addendum)
Patient continues with some constipation. This has improved per patient. Taking 2 Prunelax tablets daily which is producing a BM twice a week. BMs are soft and formed. No straining, no hard stools. No diarrhea. Continues to have intermittent rectal bleeding, maybe once a month. Rectal itching at times before BM, but resolves thereafter. No rectal pain. Suspect intermittent bleeding is due to known hemorrhoids on colonoscopy in Jan 2020 that are likely being irritated by ongoing constipation. Hemoglobin 14.5 on 03/19/19.  As patient can not afford Linzess at this time and he is satisfied with Prunelax, I have advised he add daily fiber supplement (benefiber or metamucil). If this doesn't help with producing more frequent BMs, he should add MiraLAX 1 capful (17g) daily to the Prunelax.   Follow up in 4 months.

## 2019-04-30 NOTE — Patient Instructions (Addendum)
Follow-up with PCP on cough.   Continue Protonix 40 mg 30 minutes before breakfast  Continue Prune Lax daily.   Please start metamucil or benefiber daily. This is a fiber supplement that should help with constipation. Start at the low dose and increase slowly.   You may use MiraLAX 1 capful daily as needed to help with constipation.   We will see you back in 4 months.   Aliene Altes, PA-C [facility]

## 2019-04-30 NOTE — Assessment & Plan Note (Signed)
Present for about 1 month. Occasionally productive of yellow phlegm. No shortness of breath. Not worsening. No fever or chills. Has allergies, but no nasal congestion at this time. Reports COVID exposure 4 months ago. Apparently tested and negative. Reports being tested again in order to be seen by another provider in office. No results yet per patient. These are not in our system. Lungs clear to auscultation today. No fever today.  Advised he follow-up with PCP on this.

## 2019-04-30 NOTE — Assessment & Plan Note (Signed)
Chronic. Well controlled at this time. Continue Protonix 40 mg daily 30 minutes before breakfast.

## 2019-05-03 ENCOUNTER — Other Ambulatory Visit: Payer: Self-pay | Admitting: Family Medicine

## 2019-05-03 DIAGNOSIS — G43719 Chronic migraine without aura, intractable, without status migrainosus: Secondary | ICD-10-CM

## 2019-05-09 ENCOUNTER — Encounter: Payer: Self-pay | Admitting: Family Medicine

## 2019-05-09 ENCOUNTER — Ambulatory Visit (INDEPENDENT_AMBULATORY_CARE_PROVIDER_SITE_OTHER): Payer: PRIVATE HEALTH INSURANCE | Admitting: Family Medicine

## 2019-05-09 DIAGNOSIS — G43719 Chronic migraine without aura, intractable, without status migrainosus: Secondary | ICD-10-CM

## 2019-05-09 MED ORDER — AMITRIPTYLINE HCL 50 MG PO TABS: 50.0000 mg | ORAL_TABLET | Freq: Every day | ORAL | 5 refills | Status: DC

## 2019-05-09 NOTE — Progress Notes (Signed)
Virtual Visit via telephone Note Due to COVID-19 pandemic this visit was conducted virtually. This visit type was conducted due to national recommendations for restrictions regarding the COVID-19 Pandemic (e.g. social distancing, sheltering in place) in an effort to limit this patient's exposure and mitigate transmission in our community. All issues noted in this document were discussed and addressed.  A physical exam was not performed with this format.   I connected with Jacob CoriaJeffrey Kopka on 05/09/19 at 1525 by telephone and verified that I am speaking with the correct person using two identifiers. Jacob CoriaJeffrey Dilauro is currently located at home and family is currently with them during visit. The provider, Kari BaarsMichelle Lavaya Defreitas, FNP is located in their office at time of visit.  I discussed the limitations, risks, security and privacy concerns of performing an evaluation and management service by telephone and the availability of in person appointments. I also discussed with the patient that there may be a patient responsible charge related to this service. The patient expressed understanding and agreed to proceed.  Subjective:  Patient ID: Jacob Eaton, male    DOB: 06/09/1981, 38 y.o.   MRN: [id number]  Chief Complaint:  Headache   HPI: Jacob CoriaJeffrey Que is a 38 y.o. male presenting on 05/09/2019 for Headache   Pt reports his migraine headaches have not been well controlled. States he has had an increase in the number of headaches over the last several weeks. States at times the headaches are debilitating. States he has increased hie preventative amitriptyline to 50 mg nightly and this has been very beneficial. He states the headaches start at the back of his head and wrap around to the front and down his neck. He has nausea, phonophobia, and photophobia with the headaches. States the abortive medications are helpful but do not get rid of the headache 100%. No new symptoms, just increasing number of headache  days per month.   Headache  This is a chronic problem. The current episode started more than 1 year ago. The problem occurs intermittently. The problem has been waxing and waning. The pain is located in the occipital, parietal and bilateral region. The pain radiates to the right neck and left neck. The pain quality is similar to prior headaches. The quality of the pain is described as aching, squeezing, throbbing and band-like. The pain is at a severity of 7/10. The pain is moderate. Associated symptoms include nausea, neck pain, phonophobia and photophobia. Pertinent negatives include no abdominal pain, abnormal behavior, anorexia, back pain, blurred vision, coughing, dizziness, drainage, ear pain, eye pain, eye redness, eye watering, facial sweating, fever, hearing loss, insomnia, loss of balance, muscle aches, numbness, rhinorrhea, scalp tenderness, seizures, sinus pressure, sore throat, swollen glands, tingling, tinnitus, visual change, vomiting, weakness or weight loss. The symptoms are aggravated by bright light, noise, emotional stress and weather changes. He has tried triptans and antidepressants for the symptoms. The treatment provided significant relief.     Relevant past medical, surgical, family, and social history reviewed and updated as indicated.  Allergies and medications reviewed and updated.   Past Medical History:  Diagnosis Date   Chronic tension headaches    Depression    GERD (gastroesophageal reflux disease)    Hemochromatosis     Past Surgical History:  Procedure Laterality Date   BIOPSY  10/08/2018   Procedure: BIOPSY;  Surgeon: West BaliFields, Sandi L, MD;  [facility];  Service: Endoscopy;;  sigmoid and rectal bx's   COLONOSCOPY N/A 10/08/2018   Dr. Darrick PennaFields: external  and internal hemorrhoids, anal fissure   ESOPHAGEAL ATRESIA REPAIR  1990    Social History   Socioeconomic History   Marital status: Single    Spouse name: Not on file   Number of  children: Not on file   Years of education: Not on file   Highest education level: Not on file  Occupational History   Not on file  Social Needs   Financial resource strain: Not on file   Food insecurity    Worry: Not on file    Inability: Not on file   Transportation needs    Medical: Not on file    Non-medical: Not on file  Tobacco Use   Smoking status: Former Smoker    Types: Cigarettes    Start date: 09/20/1999    Quit date: 09/19/2000    Years since quitting: 18.6   Smokeless tobacco: Never Used  Substance and Sexual Activity   Alcohol use: Yes    Comment:  'once in a blue moon"   Drug use: No   Sexual activity: Not on file  Lifestyle   Physical activity    Days per week: Not on file    Minutes per session: Not on file   Stress: Not on file  Relationships   Social connections    Talks on phone: Not on file    Gets together: Not on file    Attends religious service: Not on file    Active member of club or organization: Not on file    Attends meetings of clubs or organizations: Not on file    Relationship status: Not on file   Intimate partner violence    Fear of current or ex partner: Not on file    Emotionally abused: Not on file    Physically abused: Not on file    Forced sexual activity: Not on file  Other Topics Concern   Not on file  Social History Narrative   Not on file    Outpatient Encounter Medications as of 05/09/2019  Medication Sig   amitriptyline (ELAVIL) 50 MG tablet Take 1 tablet (50 mg total) by mouth at bedtime.   aspirin-acetaminophen-caffeine (EXCEDRIN MIGRAINE) 250-250-65 MG tablet Take 2 tablets by mouth every 6 (six) hours as needed for headache.   OVER THE COUNTER MEDICATION Prune tablets takes 2 tablets daily   pantoprazole (PROTONIX) 40 MG tablet TAKE 1 TABLET BY MOUTH EVERY DAY   rizatriptan (MAXALT) 10 MG tablet Take 1 tablet (10 mg total) by mouth as needed for migraine. May repeat in 2 hours if needed    [DISCONTINUED] amitriptyline (ELAVIL) 25 MG tablet Take 1 tablet (25 mg total) by mouth at bedtime. (Needs to be seen before next refill)   No facility-administered encounter medications on file as of 05/09/2019.     Allergies  Allergen Reactions   Naproxen Hives    Review of Systems  Constitutional: Negative for activity change, appetite change, chills, diaphoresis, fatigue, fever, unexpected weight change and weight loss.  HENT: Negative for ear pain, hearing loss, rhinorrhea, sinus pressure, sore throat and tinnitus.   Eyes: Positive for photophobia. Negative for blurred vision, pain, redness and visual disturbance.  Respiratory: Negative for cough, chest tightness and shortness of breath.   Cardiovascular: Negative for chest pain, palpitations and leg swelling.  Gastrointestinal: Positive for nausea. Negative for abdominal pain, anorexia and vomiting.  Musculoskeletal: Positive for neck pain. Negative for arthralgias, back pain, gait problem, joint swelling, myalgias and neck stiffness.  Skin: Negative  for color change, pallor, rash and wound.  Neurological: Positive for headaches. Negative for dizziness, tingling, tremors, seizures, syncope, facial asymmetry, speech difficulty, weakness, light-headedness, numbness and loss of balance.  Psychiatric/Behavioral: Negative for confusion. The patient does not have insomnia.   All other systems reviewed and are negative.        Observations/Objective: No vital signs or physical exam, this was a telephone or virtual health encounter.  Pt alert and oriented, answers all questions appropriately, and able to speak in full sentences.    Assessment and Plan: Nole was seen today for headache.  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 Discussed other preventative options. Pt would like to increase amitriptyline for  now. If this is not beneficial, will discuss other options. No red flags present. Discussed symptomatic care. Follow up in 3-6 months or sooner if headaches continue to increase in frequency.  -     amitriptyline (ELAVIL) 50 MG tablet; Take 1 tablet (50 mg total) by mouth at bedtime.     Follow Up Instructions: Return in about 6 months (around 11/09/2019), or if symptoms worsen or fail to improve, for migraine.    I discussed the assessment and treatment plan with the patient. The patient was provided an opportunity to ask questions and all were answered. The patient agreed with the plan and demonstrated an understanding of the instructions.   The patient was advised to call back or seek an in-person evaluation if the symptoms worsen or if the condition fails to improve as anticipated.  The above assessment and management plan was discussed with the patient. The patient verbalized understanding of and has agreed to the management plan. Patient is aware to call the clinic if symptoms persist or worsen. Patient is aware when to return to the clinic for a follow-up visit. Patient educated on when it is appropriate to go to the emergency department.    I provided 15 minutes of non-face-to-face time during this encounter. The call started at 1525. The call ended at 1540. The other time was used for coordination of care.    Monia Pouch, FNP-C [facility] [address] [place], [place] [address] [phone] 05/09/19

## 2019-07-15 ENCOUNTER — Inpatient Hospital Stay (HOSPITAL_COMMUNITY): Payer: PRIVATE HEALTH INSURANCE | Attending: Hematology

## 2019-07-15 ENCOUNTER — Other Ambulatory Visit: Payer: Self-pay

## 2019-07-15 LAB — VITAMIN B12: Vitamin B-12: 222 pg/mL (ref 180–914)

## 2019-07-15 LAB — COMPREHENSIVE METABOLIC PANEL
ALT: 57 U/L — ABNORMAL HIGH (ref 0–44)
AST: 31 U/L (ref 15–41)
Albumin: 4.5 g/dL (ref 3.5–5.0)
Alkaline Phosphatase: 222 U/L — ABNORMAL HIGH (ref 38–126)
Anion gap: 8 (ref 5–15)
BUN: 12 mg/dL (ref 6–20)
CO2: 27 mmol/L (ref 22–32)
Calcium: 9.2 mg/dL (ref 8.9–10.3)
Chloride: 101 mmol/L (ref 98–111)
Creatinine, Ser: 0.94 mg/dL (ref 0.61–1.24)
GFR calc Af Amer: >60 mL/min (ref >60)
GFR calc non Af Amer: >60 mL/min (ref >60)
Glucose, Bld: 79 mg/dL (ref 70–99)
Potassium: 3.8 mmol/L (ref 3.5–5.1)
Sodium: 136 mmol/L (ref 135–145)
Total Bilirubin: 0.4 mg/dL (ref 0.3–1.2)
Total Protein: 7.2 g/dL (ref 6.5–8.1)

## 2019-07-15 LAB — CBC WITH DIFFERENTIAL/PLATELET
Abs Immature Granulocytes: 0.02 10*3/uL (ref 0.00–0.07)
Basophils Absolute: 0 10*3/uL (ref 0.0–0.1)
Basophils Relative: 1 %
Eosinophils Absolute: 0.1 10*3/uL (ref 0.0–0.5)
Eosinophils Relative: 1 %
HCT: 45.9 % (ref 39.0–52.0)
Hemoglobin: 15.1 g/dL (ref 13.0–17.0)
Immature Granulocytes: 0 %
Lymphocytes Relative: 14 %
Lymphs Abs: 1.1 10*3/uL (ref 0.7–4.0)
MCH: 32.8 pg (ref 26.0–34.0)
MCHC: 32.9 g/dL (ref 30.0–36.0)
MCV: 99.6 fL (ref 80.0–100.0)
Monocytes Absolute: 0.7 10*3/uL (ref 0.1–1.0)
Monocytes Relative: 9 %
Neutro Abs: 5.7 10*3/uL (ref 1.7–7.7)
Neutrophils Relative %: 75 %
Platelets: 210 10*3/uL (ref 150–400)
RBC: 4.61 MIL/uL (ref 4.22–5.81)
RDW: 12.4 % (ref 11.5–15.5)
WBC: 7.6 10*3/uL (ref 4.0–10.5)
nRBC: 0 % (ref 0.0–0.2)

## 2019-07-15 LAB — VITAMIN D 25 HYDROXY (VIT D DEFICIENCY, FRACTURES): Vit D, 25-Hydroxy: 12.51 ng/mL — ABNORMAL LOW (ref 30–100)

## 2019-07-15 LAB — FERRITIN: Ferritin: 61 ng/mL (ref 24–336)

## 2019-07-15 LAB — IRON AND TIBC
Iron: 81 ug/dL (ref 45–182)
Saturation Ratios: 29 % (ref 17.9–39.5)
TIBC: 281 ug/dL (ref 250–450)
UIBC: 200 ug/dL

## 2019-07-22 ENCOUNTER — Inpatient Hospital Stay (HOSPITAL_COMMUNITY): Payer: PRIVATE HEALTH INSURANCE | Attending: Nurse Practitioner | Admitting: Nurse Practitioner

## 2019-07-22 ENCOUNTER — Other Ambulatory Visit: Payer: Self-pay

## 2019-07-22 DIAGNOSIS — Z820 Family history of epilepsy and other diseases of the nervous system: Secondary | ICD-10-CM | POA: Insufficient documentation

## 2019-07-22 DIAGNOSIS — Z836 Family history of other diseases of the respiratory system: Secondary | ICD-10-CM | POA: Diagnosis not present

## 2019-07-22 DIAGNOSIS — Z809 Family history of malignant neoplasm, unspecified: Secondary | ICD-10-CM | POA: Diagnosis not present

## 2019-07-22 DIAGNOSIS — Z886 Allergy status to analgesic agent status: Secondary | ICD-10-CM | POA: Insufficient documentation

## 2019-07-22 DIAGNOSIS — Z8249 Family history of ischemic heart disease and other diseases of the circulatory system: Secondary | ICD-10-CM | POA: Diagnosis not present

## 2019-07-22 DIAGNOSIS — Z8719 Personal history of other diseases of the digestive system: Secondary | ICD-10-CM | POA: Diagnosis not present

## 2019-07-22 DIAGNOSIS — Z87891 Personal history of nicotine dependence: Secondary | ICD-10-CM | POA: Insufficient documentation

## 2019-07-22 DIAGNOSIS — Z79899 Other long term (current) drug therapy: Secondary | ICD-10-CM | POA: Diagnosis not present

## 2019-07-22 DIAGNOSIS — K219 Gastro-esophageal reflux disease without esophagitis: Secondary | ICD-10-CM | POA: Diagnosis not present

## 2019-07-22 NOTE — Assessment & Plan Note (Signed)
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 - He had a liver biopsy done on 01/08/2018 which showed well-preserved architecture, hepatic lobules with no significant steatosis, cholestasis or inflammation.  No pathological fibrosis.  Iron stain shows mostly mild to focal moderate granular iron disposition with hepatocytes (7-0+) of uncertain significance. -Not a regular drinker.  No family history of hemochromatosis on the mother side.  Does not know the family history on the father's side.  Hepatitis panel was negative.  Normal ceruloplasmin.-  He has been counseled about well-balanced diet and avoid iron and vitamin C supplements. -He reports he has been getting phlebotomies every 8 weeks at [facility] in [place]. -He will be getting his phlebotomy next week. -Labs on 07/15/2019 showed his hemoglobin 13.1, ferritin 61, percent saturation 29. -Goal is to keep ferritin is close to 50 as possible. -He will follow-up in 2 months with repeat labs.  We can start seeing him every 4 months if labs are stable.

## 2019-07-22 NOTE — Progress Notes (Signed)
[place], [place] [address]   CLINIC:  Medical Oncology/Hematology  PCP:  Baruch Gouty, [place] [address] [phone]   REASON FOR VISIT: Follow-up for hemochromatosis  CURRENT THERAPY:intermittent phlebotomies   INTERVAL HISTORY:  Jacob Eaton 38 y.o. male returns for routine follow-up for hemochromatosis.  He reports he has been feeling well since his last visit.  He does have occasional migraines but they are manageable. Denies any nausea, vomiting, or diarrhea. Denies any new pains. Had not noticed any recent bleeding such as epistaxis, hematuria or hematochezia. Denies recent chest pain on exertion, shortness of breath on minimal exertion, pre-syncopal episodes, or palpitations. Denies any numbness or tingling in hands or feet. Denies any recent fevers, infections, or recent hospitalizations. Patient reports appetite at 50% and energy level at 50%.     REVIEW OF SYSTEMS:  Review of Systems  All other systems reviewed and are negative.    PAST MEDICAL/SURGICAL HISTORY:  Past Medical History:  Diagnosis Date  . Chronic tension headaches   . Depression   . GERD (gastroesophageal reflux disease)   . Hemochromatosis    Past Surgical History:  Procedure Laterality Date  . BIOPSY  10/08/2018   Procedure: BIOPSY;  Surgeon: Danie Binder, MD;  [facility];  Service: Endoscopy;;  sigmoid and rectal bx's  . COLONOSCOPY N/A 10/08/2018   Dr. Oneida Alar: external and internal hemorrhoids, anal fissure  . ESOPHAGEAL ATRESIA REPAIR  1990     SOCIAL HISTORY:  Social History   Socioeconomic History  . Marital status: Single    Spouse name: Not on file  . Number of children: Not on file  . Years of education: Not on file  . Highest education level: Not on file  Occupational History  . Not on file  Social Needs  . Financial resource strain: Not on file  . Food insecurity    Worry: Not on file     Inability: Not on file  . Transportation needs    Medical: Not on file    Non-medical: Not on file  Tobacco Use  . Smoking status: Former Smoker    Types: Cigarettes    Start date: 09/20/1999    Quit date: 09/19/2000    Years since quitting: 18.8  . Smokeless tobacco: Never Used  Substance and Sexual Activity  . Alcohol use: Yes    Comment:  'once in a blue moon"  . Drug use: No  . Sexual activity: Not on file  Lifestyle  . Physical activity    Days per week: Not on file    Minutes per session: Not on file  . Stress: Not on file  Relationships  . Social Herbalist on phone: Not on file    Gets together: Not on file    Attends religious service: Not on file    Active member of club or organization: Not on file    Attends meetings of clubs or organizations: Not on file    Relationship status: Not on file  . Intimate partner violence    Fear of current or ex partner: Not on file    Emotionally abused: Not on file    Physically abused: Not on file    Forced sexual activity: Not on file  Other Topics Concern  . Not on file  Social History Narrative  . Not on file    FAMILY HISTORY:  Family History  Problem Relation Age of Onset  . Seizures Mother   . Heart disease Father   . Cancer Father   . Asthma Sister   . Colon cancer Neg Hx   . Colon polyps Neg Hx     CURRENT MEDICATIONS:  Outpatient Encounter Medications as of 07/22/2019  Medication Sig  . amitriptyline (ELAVIL) 50 MG tablet Take 1 tablet (50 mg total) by mouth at bedtime.  Marland Kitchen aspirin-acetaminophen-caffeine (EXCEDRIN MIGRAINE) 250-250-65 MG tablet Take 2 tablets by mouth every 6 (six) hours as needed for headache.  Marland Kitchen OVER THE COUNTER MEDICATION Prune tablets takes 2 tablets daily  . pantoprazole (PROTONIX) 40 MG tablet TAKE 1 TABLET BY MOUTH EVERY DAY  . rizatriptan (MAXALT) 10 MG tablet Take 1 tablet (10 mg total) by mouth as needed for migraine. May repeat in 2 hours if needed (Patient not taking:  Reported on 07/22/2019)   No facility-administered encounter medications on file as of 07/22/2019.     ALLERGIES:  Allergies  Allergen Reactions  . Naproxen Hives     PHYSICAL EXAM:  ECOG Performance status: 1  Vitals:   07/22/19 1453  BP: 112/78  Pulse: 95  Resp: 14  Temp: 97.8 F (36.6 C)  SpO2: 100%   Filed Weights   07/22/19 1453  Weight: 148 lb 14.4 oz (67.5 kg)    Physical Exam Constitutional:      Appearance: Normal appearance. He is normal weight.  Cardiovascular:     Rate and Rhythm: Normal rate and regular rhythm.     Heart sounds: Normal heart sounds.  Pulmonary:     Effort: Pulmonary effort is normal.     Breath sounds: Normal breath sounds.  Abdominal:     General: Bowel sounds are normal.     Palpations: Abdomen is soft.  Musculoskeletal: Normal range of motion.  Skin:    General: Skin is warm.  Neurological:     Mental Status: He is alert and oriented to person, place, and time. Mental status is at baseline.  Psychiatric:        Mood and Affect: Mood normal.        Behavior: Behavior normal.        Thought Content: Thought content normal.        Judgment: Judgment normal.      LABORATORY DATA:  I have reviewed the labs as listed.  CBC    Component Value Date/Time   WBC 7.6 07/15/2019 1537   RBC 4.61 07/15/2019 1537   HGB 15.1 07/15/2019 1537   HGB 14.9 10/19/2018 0847   HCT 45.9 07/15/2019 1537   HCT 44.0 10/19/2018 0847   PLT 210 07/15/2019 1537   PLT 206 10/19/2018 0847   MCV 99.6 07/15/2019 1537   MCV 95 10/19/2018 0847   MCH 32.8 07/15/2019 1537   MCHC 32.9 07/15/2019 1537   RDW 12.4 07/15/2019 1537   RDW 12.1 10/19/2018 0847   LYMPHSABS 1.1 07/15/2019 1537   LYMPHSABS 0.8 10/19/2018 0847   MONOABS 0.7 07/15/2019 1537   EOSABS 0.1 07/15/2019 1537   EOSABS 0.1 10/19/2018 0847   BASOSABS 0.0 07/15/2019 1537   BASOSABS 0.0 10/19/2018 0847   CMP Latest Ref Rng & Units 07/15/2019 03/19/2019 02/26/2019  Glucose 70 - 99 mg/dL  79 161(W) 93  BUN 6 - 20 mg/dL 12 10 8   Creatinine 0.61 - 1.24 mg/dL 9.60 4.54  Sodium 135 - 145 mmol/L 136 136 137  Potassium 3.5 - 5.1 mmol/L 3.8 3.8 3.9  Chloride  98 - 111 mmol/L 101 101 103  CO2 22 - 32 mmol/L 27 25 25   Calcium 8.9 - 10.3 mg/dL 9.2 9.2 9.2  Total Protein 6.5 - 8.1 g/dL 7.2 7.5 7.2  Total Bilirubin 0.3 - 1.2 mg/dL 0.4 0.5 0.7  Alkaline Phos 38 - 126 U/L 222(H) 252(H) 244(H)  AST 15 - 41 U/L 31 49(H) 41  ALT 0 - 44 U/L 57(H) 76(H) 66(H)    I personally performed a face-to-face visit.  All questions were answered to patient's stated satisfaction. Encouraged patient to call with any new concerns or questions before his next visit to the cancer center and we can certain see him sooner, if needed.     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 - He had a liver biopsy done on 01/08/2018 which showed well-preserved architecture, hepatic lobules with no significant steatosis, cholestasis or inflammation.  No pathological fibrosis.  Iron stain shows mostly mild to focal moderate granular iron disposition with hepatocytes (1-2+) of uncertain significance. -Not a regular drinker.  No family history of hemochromatosis on the mother side.  Does not know the family history on the father's side.  Hepatitis panel was negative.  Normal ceruloplasmin.-  He has been counseled about well-balanced diet and avoid iron and vitamin C supplements. -He reports he has been getting phlebotomies every 8 weeks at [facility] in [place]. -He will be getting his phlebotomy next week. -Labs on 07/15/2019 showed his hemoglobin 13.1, ferritin 61, percent saturation 29. -Goal is to keep ferritin is close to 50 as possible. -He will follow-up in 2 months with repeat labs.  We can start seeing him every 4 months if labs are stable.       Orders placed this encounter:  Orders Placed This Encounter  Procedures  . Lactate dehydrogenase  . Vitamin B12  . VITAMIN D 25 Hydroxy (Vit-D Deficiency, Fractures)  . CBC with Differential/Platelet  . Comprehensive metabolic panel  . Ferritin  . Iron and TIBC      Jacob Budandi Daysia Vandenboom, FNP-C [facility] [phone]

## 2019-07-22 NOTE — Patient Instructions (Signed)
[facility] at [facility] Discharge Instructions     Thank you for choosing [facility] at [facility] to provide your oncology and hematology care.  To afford each patient quality time with our provider, please arrive at least 15 minutes before your scheduled appointment time.   If you have a lab appointment with the Cancer Center please come in thru the Main Entrance and check in at the main information desk.  You need to re-schedule your appointment should you arrive 10 or more minutes late.  We strive to give you quality time with our providers, and arriving late affects you and other patients whose appointments are after yours.  Also, if you no show three or more times for appointments you may be dismissed from the clinic at the providers discretion.     Again, thank you for choosing [facility].  Our hope is that these requests will decrease the amount of time that you wait before being seen by our physicians.       _____________________________________________________________  Should you have questions after your visit to [facility], please contact our office at [phone] between the hours of 8:00 a.m. and 4:30 p.m.  Voicemails left after 4:00 p.m. will not be returned until the following business day.  For prescription refill requests, have your pharmacy contact our office and allow 72 hours.    Due to Covid, you will need to wear a mask upon entering the hospital. If you do not have a mask, a mask will be given to you at the Main Entrance upon arrival. For doctor visits, patients may have 1 support person with them. For treatment visits, patients can not have anyone with them due to social distancing guidelines and our immunocompromised population.      

## 2019-09-01 ENCOUNTER — Encounter: Payer: Self-pay | Admitting: Gastroenterology

## 2019-09-01 NOTE — Progress Notes (Signed)
Primary Care Physician:  Sonny Mastersakes, Linda M, FNP  Primary GI: Dr. Darrick PennaFields  Patient Location: Home   [facility]   Reason for Visit: Follow-up of Constipation and GERD.    Persons present on the virtual encounter, with roles: Ermalinda MemosKristen Klare Criss, PA-C (provider), Jacob DakinsJeffery Eaton (patient), patients mom also at home with patient but no on the call.     Total time (minutes) spent on medical discussion: 15minutes   Due to COVID-19, visit was conducted using virtual method.  Visit was requested by patient.  Virtual Visit via Telephone Note Due to COVID-19, visit is conducted virtually and was requested by patient.   I connected with Jacob CoriaJeffrey Eaton on 09/02/19 at  3:30 PM EST by telephone and verified that I am speaking with the correct person using two identifiers.   I discussed the limitations, risks, security and privacy concerns of performing an evaluation and management service by telephone and the availability of in person appointments. I also discussed with the patient that there may be a patient responsible charge related to this service. The patient expressed understanding and agreed to proceed.  Chief Complaint  Patient presents with  . Gastroesophageal Reflux    f/u doing ok     History of Present Illness: Jacob CoriaJeffrey Eaton is a 38 y.o. male presenting today with a history of GERD, hemochromatosis followed by Dr. Ellin SabaKatragadda with hematology/oncology, and constipation. TCS on Jan 2020 for rectal bleeding which  revealed internal and external hemorrhoids, anal fissure on perianal exam.  He presents today for follow-up of constipation and GERD.  Patient was last seen on 04/30/19.  Historically, patient had been on Linzess 72 mcg for constipation however he no longer had insurance.  Had been working for 1 month and would not receive insurance until 90 days.  Using prune lax daily producing a BM about twice a week and stated he was happy with this.  Reported stools were soft and  formed and without straining.  Denied rectal pain.  Reported occasional hematochezia about once a month.  Had tried Preparation H after his colonoscopy but did not like using it.  GERD symptoms were well controlled on Protonix daily.  He was advised to add a daily fiber supplement to prune lax.  If this did not produce more frequent BMs, he was advised to add MiraLAX 1 capful daily.  Follow-up in 4 months.  Today:  Constipation: Taking prune lax daily. Having a BM every other day, sometime 2 in 1 day. Didn't like metamucil. Has been eating fiber bars. Not taking MiraLAX. Occasional brbpr. Last episode about 1 month. Very little on the stool and a little on toilet tissue. Using preparation H wipes for hemorrhoids. Has used preparation H supository as needed. This works well. Did not like preparation H cream. No rectal pain. Last episode of pain a few months ago. This was more of a throbbing pain. Occasional rectal itching with hemorrhoid flares. Denies sharp knife like pain.   GERD: Taking Protonix daily. Taking 1 hour before breakfast. No breakthrough reflux symptoms. No dysphagia. No nausea or vomiting. Taking No NSAIDs. Excedrin Migraine as needed. Less than once a week.       Past Medical History:  Diagnosis Date  . Chronic tension headaches   . Depression   . GERD (gastroesophageal reflux disease)   . Hemochromatosis      Past Surgical History:  Procedure Laterality Date  . BIOPSY  10/08/2018   Procedure: BIOPSY;  Surgeon: West BaliFields, Sandi L,  MD;  [facility];  Service: Endoscopy;;  sigmoid and rectal bx's  . COLONOSCOPY N/A 10/08/2018   Dr. Darrick Penna: external and internal hemorrhoids, anal fissure.  Repeat at age 24.  Marland Kitchen ESOPHAGEAL ATRESIA REPAIR  1990     Current Meds  Medication Sig  . amitriptyline (ELAVIL) 50 MG tablet Take 1 tablet (50 mg total) by mouth at bedtime.  Marland Kitchen aspirin-acetaminophen-caffeine (EXCEDRIN MIGRAINE) 250-250-65 MG tablet Take 2 tablets by mouth every 6  (six) hours as needed for headache.  . Cholecalciferol (VITAMIN D3) 25 MCG (1000 UT) CAPS Take by mouth.  Marland Kitchen OVER THE COUNTER MEDICATION Prune tablets takes 2 tablets daily  . pantoprazole (PROTONIX) 40 MG tablet TAKE 1 TABLET BY MOUTH EVERY DAY  . rizatriptan (MAXALT) 10 MG tablet Take 1 tablet (10 mg total) by mouth as needed for migraine. May repeat in 2 hours if needed  . vitamin B-12 (CYANOCOBALAMIN) 1000 MCG tablet Take 1,000 mcg by mouth daily.     Family History  Problem Relation Age of Onset  . Seizures Mother   . Heart disease Father   . Cancer Father   . Asthma Sister   . Colon cancer Neg Hx   . Colon polyps Neg Hx     Social History   Socioeconomic History  . Marital status: Single    Spouse name: Not on file  . Number of children: Not on file  . Years of education: Not on file  . Highest education level: Not on file  Occupational History  . Not on file  Tobacco Use  . Smoking status: Former Smoker    Types: Cigarettes    Start date: 09/20/1999    Quit date: 09/19/2000    Years since quitting: 18.9  . Smokeless tobacco: Never Used  Substance and Sexual Activity  . Alcohol use: Yes    Comment:  'once in a blue moon"  . Drug use: No  . Sexual activity: Not on file  Other Topics Concern  . Not on file  Social History Narrative  . Not on file   Social Determinants of Health   Financial Resource Strain:   . Difficulty of Paying Living Expenses: Not on file  Food Insecurity:   . Worried About Programme researcher, broadcasting/film/video in the Last Year: Not on file  . Ran Out of Food in the Last Year: Not on file  Transportation Needs:   . Lack of Transportation (Medical): Not on file  . Lack of Transportation (Non-Medical): Not on file  Physical Activity:   . Days of Exercise per Week: Not on file  . Minutes of Exercise per Session: Not on file  Stress:   . Feeling of Stress : Not on file  Social Connections:   . Frequency of Communication with Friends and Family: Not on file    . Frequency of Social Gatherings with Friends and Family: Not on file  . Attends Religious Services: Not on file  . Active Member of Clubs or Organizations: Not on file  . Attends Banker Meetings: Not on file  . Marital Status: Not on file       Review of Systems: Gen: Denies fever, chills, lightheadedness, dizziness, presyncope, or syncope. CV: Denies chest pain or palpitations Resp: Denies dyspnea at rest or cough Derm: Denies rash Psych: Denies depression, anxiety  Heme: See HPI  Observations/Objective: No distress. Unable to perform physical exam due to telephone encounter. No video available.   Assessment and Plan:  38 year old male with history of GERD, constipation, hemorrhoids, and anal fissure with colonoscopy in January 2020 which revealed internal and external hemorrhoids, anal fissure on perianal exam.   Constipation: Symptoms are well controlled at this time on prune lax daily and addition of fiber bars daily.  He did not like Metamucil.  He is not needing MiraLAX at this time.  Recommend that he continue his current regimen, drink enough water to keep urine pale yellow to clear, and use MiraLAX 1 capful daily as needed if he develops constipation.    GERD: Symptoms are well controlled on Protonix 40 mg daily.  No breakthrough symptoms.  No alarm symptoms.  Continue Protonix 40 mg daily.  Rectal bleeding: Patient has history of intermittent rectal bleeding.  This is the reason he underwent colonoscopy in January 2020.  Rectal bleeding is likely secondary to internal hemorrhoids.  He reports some rectal throbbing and itching with hemorrhoid flares.  Denies sharp knifelike pain or symptoms similar to his prior anal fissure.  Uses Preparation H wipes and Preparation H suppositories as needed which controls symptoms well.  Last episode of rectal bleeding/rectal pain a month or so ago.  Advised to continue using Preparation H suppositories BID x5-7 days as needed.   Continue to monitor symptoms.  Follow Up Instructions: Follow-up in 6 months.  Call with questions or concerns prior.   I discussed the assessment and treatment plan with the patient. The patient was provided an opportunity to ask questions and all were answered. The patient agreed with the plan and demonstrated an understanding of the instructions.   The patient was advised to call back or seek an in-person evaluation if the symptoms worsen or if the condition fails to improve as anticipated.  I provided 15 minutes of non-face-to-face time during this encounter.  Aliene Altes, PA-C [facility]

## 2019-09-02 ENCOUNTER — Ambulatory Visit (INDEPENDENT_AMBULATORY_CARE_PROVIDER_SITE_OTHER): Payer: PRIVATE HEALTH INSURANCE | Admitting: Gastroenterology

## 2019-09-02 ENCOUNTER — Other Ambulatory Visit: Payer: Self-pay

## 2019-09-02 ENCOUNTER — Encounter: Payer: Self-pay | Admitting: Gastroenterology

## 2019-09-02 DIAGNOSIS — K5900 Constipation, unspecified: Secondary | ICD-10-CM

## 2019-09-02 DIAGNOSIS — K219 Gastro-esophageal reflux disease without esophagitis: Secondary | ICD-10-CM

## 2019-09-02 DIAGNOSIS — K625 Hemorrhage of anus and rectum: Secondary | ICD-10-CM

## 2019-09-02 NOTE — Patient Instructions (Addendum)
Continue Protonix 40 mg daily 30 minutes before breakfast.  Continue prune lax and fiber bars as this seems to be doing very well to help with bowel regularity.  If you do have any episodes of constipation, you may add MiraLAX 1 capful in 8 ounces of water or your choice beverage as needed.  Drink enough water to keep your urine pale yellow to clear.  When hemorrhoids flare, use Preparation H suppositories twice daily for 5-7 days at a time.  We will plan to follow-up with you in 6 months.  Call if you have questions or concerns prior.  Aliene Altes, PA-C [facility]

## 2019-09-03 ENCOUNTER — Other Ambulatory Visit (HOSPITAL_COMMUNITY): Payer: Self-pay | Admitting: Nurse Practitioner

## 2019-09-03 ENCOUNTER — Encounter: Payer: Self-pay | Admitting: Gastroenterology

## 2019-09-03 NOTE — Progress Notes (Signed)
Cc'ed to pcp °

## 2019-09-09 ENCOUNTER — Other Ambulatory Visit: Payer: Self-pay | Admitting: Family Medicine

## 2019-09-09 DIAGNOSIS — G43709 Chronic migraine without aura, not intractable, without status migrainosus: Secondary | ICD-10-CM

## 2019-09-09 NOTE — Telephone Encounter (Signed)
Needs visit prior to additional refills

## 2019-09-24 ENCOUNTER — Inpatient Hospital Stay (HOSPITAL_COMMUNITY): Payer: PRIVATE HEALTH INSURANCE | Attending: Hematology

## 2019-09-24 ENCOUNTER — Other Ambulatory Visit: Payer: Self-pay

## 2019-09-24 LAB — CBC WITH DIFFERENTIAL/PLATELET
Abs Immature Granulocytes: 0.02 10*3/uL (ref 0.00–0.07)
Basophils Absolute: 0 10*3/uL (ref 0.0–0.1)
Basophils Relative: 1 %
Eosinophils Absolute: 0.2 10*3/uL (ref 0.0–0.5)
Eosinophils Relative: 3 %
HCT: 42.1 % (ref 39.0–52.0)
Hemoglobin: 14.2 g/dL (ref 13.0–17.0)
Immature Granulocytes: 0 %
Lymphocytes Relative: 19 %
Lymphs Abs: 1.1 10*3/uL (ref 0.7–4.0)
MCH: 33.3 pg (ref 26.0–34.0)
MCHC: 33.7 g/dL (ref 30.0–36.0)
MCV: 98.8 fL (ref 80.0–100.0)
Monocytes Absolute: 0.6 10*3/uL (ref 0.1–1.0)
Monocytes Relative: 11 %
Neutro Abs: 3.8 10*3/uL (ref 1.7–7.7)
Neutrophils Relative %: 66 %
Platelets: 233 10*3/uL (ref 150–400)
RBC: 4.26 MIL/uL (ref 4.22–5.81)
RDW: 12.6 % (ref 11.5–15.5)
WBC: 5.8 10*3/uL (ref 4.0–10.5)
nRBC: 0 % (ref 0.0–0.2)

## 2019-09-24 LAB — COMPREHENSIVE METABOLIC PANEL
ALT: 68 U/L — ABNORMAL HIGH (ref 0–44)
AST: 43 U/L — ABNORMAL HIGH (ref 15–41)
Albumin: 4.4 g/dL (ref 3.5–5.0)
Alkaline Phosphatase: 229 U/L — ABNORMAL HIGH (ref 38–126)
Anion gap: 9 (ref 5–15)
BUN: 10 mg/dL (ref 6–20)
CO2: 26 mmol/L (ref 22–32)
Calcium: 9.1 mg/dL (ref 8.9–10.3)
Chloride: 101 mmol/L (ref 98–111)
Creatinine, Ser: 0.93 mg/dL (ref 0.61–1.24)
GFR calc Af Amer: >60 mL/min (ref >60)
GFR calc non Af Amer: >60 mL/min (ref >60)
Glucose, Bld: 119 mg/dL — ABNORMAL HIGH (ref 70–99)
Potassium: 4 mmol/L (ref 3.5–5.1)
Sodium: 136 mmol/L (ref 135–145)
Total Bilirubin: 0.7 mg/dL (ref 0.3–1.2)
Total Protein: 7.2 g/dL (ref 6.5–8.1)

## 2019-09-24 LAB — IRON AND TIBC
Iron: 146 ug/dL (ref 45–182)
Saturation Ratios: 54 % — ABNORMAL HIGH (ref 17.9–39.5)
TIBC: 268 ug/dL (ref 250–450)
UIBC: 122 ug/dL

## 2019-09-24 LAB — FERRITIN: Ferritin: 78 ng/mL (ref 24–336)

## 2019-09-24 LAB — VITAMIN B12: Vitamin B-12: 496 pg/mL (ref 180–914)

## 2019-09-24 LAB — LACTATE DEHYDROGENASE: LDH: 114 U/L (ref 98–192)

## 2019-09-24 LAB — VITAMIN D 25 HYDROXY (VIT D DEFICIENCY, FRACTURES): Vit D, 25-Hydroxy: 35.66 ng/mL (ref 30–100)

## 2019-10-01 ENCOUNTER — Ambulatory Visit (HOSPITAL_COMMUNITY): Payer: PRIVATE HEALTH INSURANCE | Admitting: Nurse Practitioner

## 2019-10-10 ENCOUNTER — Other Ambulatory Visit: Payer: Self-pay | Admitting: Nurse Practitioner

## 2019-11-26 ENCOUNTER — Other Ambulatory Visit: Payer: Self-pay | Admitting: Family Medicine

## 2019-11-26 DIAGNOSIS — G43719 Chronic migraine without aura, intractable, without status migrainosus: Secondary | ICD-10-CM

## 2019-12-24 NOTE — Progress Notes (Signed)
REVIEWED-NO ADDITIONAL RECOMMENDATIONS. 

## 2020-03-03 NOTE — Progress Notes (Signed)
Referring Provider: Sonny Masters, FNP Primary Care Physician:  Sonny Masters, FNP Primary GI Physician: Dr. Darrick Penna (Dr. Jena Gauss following for now)  Chief Complaint  Patient presents with   Blood In Stools    a little constipation    HPI:   Jacob Eaton is a 39 y.o. male with a history of GERD, constipation, hemorrhoids, anal fissure with colonoscopy in January 2020 which revealed internal and external hemorrhoids, anal fissure on perianal exam.  Due for repeat colonoscopy at age 50.  Also with history of elevated LFTs with extensive evaluation in the past including liver biopsy and ultimately found to have hemochromatosis now followed by Dr. Ellin Saba with hematology/oncology. He presents today for follow-up.   Last seen in our office in December 2020.  Constipation was well controlled on prune lax add daily fiber bars.  No longer needing MiraLAX.  GERD symptoms are well controlled on Protonix 40 mg daily.  Last episode of rectal bleeding was about 1 month prior.  Hemorrhoid symptoms respond well to Preparation H suppositories and Preparation H wipes.  He is advised to continue his current medications and follow-up in 6 months.  Today: Blood with all BMs. Blood is in the stool and on toilet tissue. BMs once a week. Stools are hard. Tries not to strain. Some rectal pain with BMs. Sometimes it is sharp like a knife. Burning sensation as well. No known hemorrhoids on the outside. Using preparation H and preparation H wipes. Symptoms present for 6 months. Still taking prune lax and eating fiber bars daily. Has cut back on cheese. Drinking fluids when he can. States he can't drink as much at work because he can't have anything on the floor. Urine is yellow. Stools are bristol 2-3.   Doesn't like the way MiraLAX taste. Doesn't feel like it helped anyway. Doesn't have insurance. Can't afford Linzess. Linzess 72 worked well in the past.   No lightheadedness, pre-syncope, or syncope. No CP or  palpitation.   No N/V/abdominal pain/GERD symptoms/dysphagia.   Past Medical History:  Diagnosis Date   Chronic tension headaches    Constipation    Depression    GERD (gastroesophageal reflux disease)    Hemochromatosis    Hemorrhoids    Documented on colonoscopy in January 2020.    Past Surgical History:  Procedure Laterality Date   BIOPSY  10/08/2018   Procedure: BIOPSY;  Surgeon: West Bali, MD;  Location: [facility];  Service: Endoscopy;;  sigmoid and rectal bx's   COLONOSCOPY N/A 10/08/2018   Dr. Darrick Penna: external and internal hemorrhoids, anal fissure.  Repeat at age 80.   ESOPHAGEAL ATRESIA REPAIR  1990    Current Outpatient Medications  Medication Sig Dispense Refill   amitriptyline (ELAVIL) 50 MG tablet TAKE 1 TABLET BY MOUTH AT BEDTIME 30 tablet 5   aspirin-acetaminophen-caffeine (EXCEDRIN MIGRAINE) 250-250-65 MG tablet Take 2 tablets by mouth as needed for headache.      Cholecalciferol (VITAMIN D3) 25 MCG (1000 UT) CAPS Take by mouth.     OVER THE COUNTER MEDICATION every other day. Prune tablets takes 2 tablets daily      pantoprazole (PROTONIX) 40 MG tablet TAKE 1 TABLET BY MOUTH EVERY DAY 90 tablet 2   rizatriptan (MAXALT) 10 MG tablet TAKE 1 TABLET BY MOUTH AS NEEDED FOR migraine (MAY REPEAT in TWO hours if needed) 10 tablet 0   vitamin B-12 (CYANOCOBALAMIN) 1000 MCG tablet Take 1,000 mcg by mouth daily.     No  current facility-administered medications for this visit.    Allergies as of 03/04/2020 - Review Complete 03/04/2020  Allergen Reaction Noted   Naproxen Hives 04/27/2017    Family History  Problem Relation Age of Onset   Seizures Mother    Heart disease Father    Cancer Father    Asthma Sister    Colon cancer Neg Hx    Colon polyps Neg Hx     Social History   Socioeconomic History   Marital status: Single    Spouse name: Not on file   Number of children: Not on file   Years of education: Not on file    Highest education level: Not on file  Occupational History   Not on file  Tobacco Use   Smoking status: Former Smoker    Types: Cigarettes    Start date: 09/20/1999    Quit date: 09/19/2000    Years since quitting: 19.4   Smokeless tobacco: Never Used  Vaping Use   Vaping Use: Never used  Substance and Sexual Activity   Alcohol use: Yes    Comment:  'once in a blue moon"   Drug use: No   Sexual activity: Not on file  Other Topics Concern   Not on file  Social History Narrative   Not on file   Social Determinants of Health   Financial Resource Strain:    Difficulty of Paying Living Expenses:   Food Insecurity:    Worried About Charity fundraiser in the Last Year:    Arboriculturist in the Last Year:   Transportation Needs:    Film/video editor (Medical):    Lack of Transportation (Non-Medical):   Physical Activity:    Days of Exercise per Week:    Minutes of Exercise per Session:   Stress:    Feeling of Stress :   Social Connections:    Frequency of Communication with Friends and Family:    Frequency of Social Gatherings with Friends and Family:    Attends Religious Services:    Active Member of Clubs or Organizations:    Attends Music therapist:    Marital Status:     Review of Systems: Gen: Denies fever, chills, lightheadedness, dizziness, presyncope, syncope CV: Denies chest pain or heart palpitations. Resp: Denies dyspnea at rest or cough. GI: See HPI Heme: See HPI  Physical Exam: BP 122/80    Pulse 94    Temp 97.7 F (36.5 C) (Temporal)    Ht 5\' 11"  (1.803 m)    Wt 152 lb (68.9 kg)    BMI 21.20 kg/m  General:   Alert and oriented. No distress noted. Pleasant and cooperative.  Head:  Normocephalic and atraumatic. Eyes:  Conjuctiva clear without scleral icterus. Heart:  S1, S2 present without murmurs appreciated. Lungs:  Clear to auscultation bilaterally. No wheezes, rales, or rhonchi. No distress.  Abdomen:  +BS,  soft, non-tender and non-distended. No rebound or guarding. No HSM or masses noted. Rectum: No obvious anal fissure or external hemorrhoids.  Internal exam is limited due to discomfort.  No bright red blood. Msk:  Symmetrical without gross deformities. Normal posture. Extremities:  Without edema. Neurologic:  Alert and  oriented x4 Psych:  Normal mood and affect.

## 2020-03-04 ENCOUNTER — Encounter: Payer: Self-pay | Admitting: Gastroenterology

## 2020-03-04 ENCOUNTER — Other Ambulatory Visit: Payer: Self-pay

## 2020-03-04 ENCOUNTER — Ambulatory Visit (INDEPENDENT_AMBULATORY_CARE_PROVIDER_SITE_OTHER): Payer: Self-pay | Admitting: Gastroenterology

## 2020-03-04 ENCOUNTER — Other Ambulatory Visit (HOSPITAL_COMMUNITY)
Admission: RE | Admit: 2020-03-04 | Discharge: 2020-03-04 | Disposition: A | Discharge status: Home / Self Care | Payer: PRIVATE HEALTH INSURANCE | Source: Ambulatory Visit | Attending: Gastroenterology | Admitting: Gastroenterology

## 2020-03-04 VITALS — BP 122/80 | HR 94 | Temp 97.7°F | Ht 71.0 in | Wt 152.0 lb

## 2020-03-04 DIAGNOSIS — K625 Hemorrhage of anus and rectum: Secondary | ICD-10-CM | POA: Insufficient documentation

## 2020-03-04 DIAGNOSIS — K5900 Constipation, unspecified: Secondary | ICD-10-CM

## 2020-03-04 LAB — CBC WITH DIFFERENTIAL/PLATELET
Abs Immature Granulocytes: 0.01 10*3/uL (ref 0.00–0.07)
Basophils Absolute: 0 10*3/uL (ref 0.0–0.1)
Basophils Relative: 1 %
Eosinophils Absolute: 0.1 10*3/uL (ref 0.0–0.5)
Eosinophils Relative: 2 %
HCT: 42.9 % (ref 39.0–52.0)
Hemoglobin: 14.2 g/dL (ref 13.0–17.0)
Immature Granulocytes: 0 %
Lymphocytes Relative: 18 %
Lymphs Abs: 1 10*3/uL (ref 0.7–4.0)
MCH: 32.9 pg (ref 26.0–34.0)
MCHC: 33.1 g/dL (ref 30.0–36.0)
MCV: 99.3 fL (ref 80.0–100.0)
Monocytes Absolute: 0.5 10*3/uL (ref 0.1–1.0)
Monocytes Relative: 9 %
Neutro Abs: 4 10*3/uL (ref 1.7–7.7)
Neutrophils Relative %: 70 %
Platelets: 203 10*3/uL (ref 150–400)
RBC: 4.32 MIL/uL (ref 4.22–5.81)
RDW: 12.4 % (ref 11.5–15.5)
WBC: 5.6 10*3/uL (ref 4.0–10.5)
nRBC: 0 % (ref 0.0–0.2)

## 2020-03-04 NOTE — Assessment & Plan Note (Addendum)
Chronic history of constipation.  Historically, he had responded well to Linzess 72 mcg but he was unable to afford this.  Thereafter, he had managed symptoms well with prune lax and fiber bars.  However, over the last 6 months, he has had return of constipation.  Notably, he has some decrease in water intake as he is unable to drink very much at work.  Currently with BMs about once a week that are hard, require straining, and are associated with rectal bleeding.  Known history of hemorrhoids as well as an anal fissure on colonoscopy in January 2020.  Admits to sharp rectal pain when passing stool as well as rectal burning at times.  Rectal exam with no obvious anal fissure or external hemorrhoids.  Internal exam was limited due to discomfort.  No bright red blood on exam.  Resume Linzess 72 mcg daily 30 minutes before first meal.  Samples provided. Patient provided forms to fill out for patient assistance with the health department.  He is to return these to our office. Advised to add Benefiber or Metamucil daily. Advised to increase his water intake and drink enough water to keep his urine pale yellow to clear. Start [facility] hemorrhoid cream compounded with nitroglycerin and lidocaine QID for possible anal fissure/hemorrhoid management.  Prescription called into [facility]. I requested progress report in 2 weeks. Plan to follow-up in 4 weeks.

## 2020-03-04 NOTE — Patient Instructions (Addendum)
Please have your labs completed at [facility]  I am providing you with samples of Linzess 72 mcg.  You will take this daily 30 minutes before your first meal.  I am providing you with samples today.  Please call me in 1-2 weeks with a progress report on how this is working.  Please fill out patient assistance paperwork for Linzess and return to our office.  Please try to increase her daily water intake.  You should drink enough water to keep your urine pale yellow to clear.  Please add Benefiber or Metamucil daily.  These are fiber supplements that will help with constipation.  I am also calling in a hemorrhoid cream that is compounded with nitroglycerin to [facility].  They will call you when the prescription is ready.  You will apply this per your rectum 4 times daily for at least the next 4 weeks.  The goal will be to apply this medication for 2 weeks beyond symptom resolution.  Be sure to use gloves during application and wash her hands well thereafter.  Be sure not to sit on the toilet more than 2 to 3 minutes at a time. Avoid straining.  We will plan to see you back in our office in 4 weeks.  Call with questions or concerns prior.  Ermalinda Memos, PA-C [facility]

## 2020-03-04 NOTE — Assessment & Plan Note (Addendum)
39 year old male with history of intermittent rectal bleeding thought to be secondary to hemorrhoids as well as an anal fissure that was noted on colonoscopy in January 2020.  Rectal bleeding had improved quite a bit with better management of constipation.  However, over the last 6 months, constipation has returned with hard stools about once a week with associated rectal bleeding.  Also reports sharp pain when passing stool as well as rectal burning.  Rectal exam with no obvious anal fissure or external hemorrhoids.  Internal exam was limited due to discomfort.  No bright red blood on exam.  Suspect rectal bleeding is likely secondary to hemorrhoids in the setting of constipation.  Cannot rule out anal fissure as he is describing symptoms that would suggest this although no obvious fissure on exam.  We will start with treating symptomatically with [facility] hemorrhoid cream compounded with nitroglycerin and lidocaine QID and Linzess 72 mcg daily for constipation. Advised to add Benefiber or Metamucil daily, limit toilet time to 2-3 minutes, avoid straining, and drink enough water to keep his urine pale yellow to clear. I will go ahead and update a CBC. Follow-up in 4 weeks.   I have called in hemorrhoid cream to [facility].  We provided samples of Linzess 72 mcg today.  Patient is to fill out patient assistance forms and return them to our office in hopes to receive Linzess through the health department.  I also requested a 2-week progress report.

## 2020-03-05 ENCOUNTER — Encounter: Payer: Self-pay | Admitting: Gastroenterology

## 2020-03-06 NOTE — Progress Notes (Signed)
Cc'ed to pcp °

## 2020-03-09 ENCOUNTER — Telehealth: Payer: Self-pay

## 2020-03-09 NOTE — Telephone Encounter (Signed)
FYI Pt stopped by office today to bring pt assistance forms and pay stub to submit for Linzess 73 mcg. Papers will be faxed when signed by [facility].

## 2020-03-11 NOTE — Telephone Encounter (Signed)
Signed and returned to Alicia Moore.  

## 2020-03-11 NOTE — Telephone Encounter (Signed)
Noted. Pt assistance paper work faxed to [employer]. Waiting on an approval or denial.

## 2020-04-09 NOTE — Telephone Encounter (Signed)
Spoke with pt. We received an approval letter for pt assistance. Pt should be receiving a delivery soon. Samples of Linzess are ready for pickup.

## 2020-04-09 NOTE — Telephone Encounter (Signed)
Pt wants to know if he can get Linzess samples.  Says he hasn't heard anything from where he did pt assistance forms.  [phone]

## 2020-04-14 NOTE — Telephone Encounter (Signed)
Medication Linzess 72 mcg arrived. Lmom, medication is ready for pickup. Waiting on a return call from pt.

## 2020-04-25 NOTE — Progress Notes (Signed)
Referring Provider: Sonny Masters, FNP Primary Care Physician:  System, Pcp Not In Primary GI Physician: Dr. Marletta Lor  Chief Complaint  Patient presents with  . Constipation    Linzess helped    HPI:   Jacob Eaton is a 39 y.o. male with a history of GERD, constipation, hemorrhoids, anal fissure with colonoscopy in January 2020 which revealed internal and external hemorrhoids, anal fissure on perianal exam.  Due for repeat colonoscopy at age 39.  Also with history of elevated LFTs with extensive evaluation in the past including liver biopsy and ultimately found to have hemochromatosis now followed by Dr. Ellin Saba with hematology/oncology.He presents today for follow-up.   At his last visit 03/04/2020, constipation symptoms were not adequately controlled with prune lax and fiber bars.  Reported decreased water intake due to work schedule.  He was currently having BMs about once a week that were hard, required straining, and with associated rectal bleeding.  Admitted to sharp rectal pain and rectal burning at times.  No obvious anal fissure or external hemorrhoids on exam.  Internal exam was limited due to discomfort.  Plan to update CBC, start Linzess 72 mcg (samples provided and patient to complete patient assistance forms), [facility] hemorrhoid cream compounded with nitroglycerin and lidocaine 4 times daily, add Benefiber or Metamucil daily, limit toilet time, avoid straining, increase water intake.  Hemoglobin 14.2 on 03/04/20 Patient assistance for Linzess was approved.   Today:   Constipation: Linzess 72 mcg daily helped. Just picked this up last week.  Currently with BMs daily to a couple times a day. Stools are loose/mushy but he is satisfied with this. No rectal pain. Rectal bleeding and rectal pain has resolved as of last week. Using rectal cream twice a day. No abdominal pain. No nausea, vomiting, GERD symptoms or dysphagia. Protonix 40 mg daily is working well. No  melena. Weight is stable.  Did not add benefiber or metamucil.   States some days he will go all day without urinating. Started taking Azo which seems to be helping. States he has a constant thirst which has been present for years. Decreased urinary frequency had difficulty initiating for a couple of months. No dysuria. No hematuria.   Has phlebotomy scheduled in September for history of hemochromatosis.  Past Medical History:  Diagnosis Date  . Chronic tension headaches   . Constipation   . Depression   . GERD (gastroesophageal reflux disease)   . Hemochromatosis   . Hemorrhoids    Documented on colonoscopy in January 2020.    Past Surgical History:  Procedure Laterality Date  . BIOPSY  10/08/2018   Procedure: BIOPSY;  Surgeon: West Bali, MD;  Location: [facility];  Service: Endoscopy;;  sigmoid and rectal bx's  . COLONOSCOPY N/A 10/08/2018   Dr. Darrick Penna: external and internal hemorrhoids, anal fissure.  Repeat at age 22.  Marland Kitchen ESOPHAGEAL ATRESIA REPAIR  1990    Current Outpatient Medications  Medication Sig Dispense Refill  . amitriptyline (ELAVIL) 50 MG tablet TAKE 1 TABLET BY MOUTH AT BEDTIME 30 tablet 5  . aspirin-acetaminophen-caffeine (EXCEDRIN MIGRAINE) 250-250-65 MG tablet Take 2 tablets by mouth as needed for headache.     . Cholecalciferol (VITAMIN D3) 25 MCG (1000 UT) CAPS Take by mouth.    . linaclotide (LINZESS) 72 MCG capsule Take 72 mcg by mouth daily before breakfast.    . OVER THE COUNTER MEDICATION every other day. Prune tablets takes 2 tablets daily     .  pantoprazole (PROTONIX) 40 MG tablet TAKE 1 TABLET BY MOUTH EVERY DAY 90 tablet 2  . rizatriptan (MAXALT) 10 MG tablet TAKE 1 TABLET BY MOUTH AS NEEDED FOR migraine (MAY REPEAT in TWO hours if needed) 10 tablet 0  . vitamin B-12 (CYANOCOBALAMIN) 1000 MCG tablet Take 1,000 mcg by mouth daily.     No current facility-administered medications for this visit.    Allergies as of 04/27/2020 - Review  Complete 04/27/2020  Allergen Reaction Noted  . Naproxen Hives 04/27/2017    Family History  Problem Relation Age of Onset  . Seizures Mother   . Heart disease Father   . Cancer Father   . Asthma Sister   . Colon cancer Neg Hx   . Colon polyps Neg Hx     Social History   Socioeconomic History  . Marital status: Single    Spouse name: Not on file  . Number of children: Not on file  . Years of education: Not on file  . Highest education level: Not on file  Occupational History  . Not on file  Tobacco Use  . Smoking status: Former Smoker    Types: Cigarettes    Start date: 09/20/1999    Quit date: 09/19/2000    Years since quitting: 19.6  . Smokeless tobacco: Never Used  Vaping Use  . Vaping Use: Never used  Substance and Sexual Activity  . Alcohol use: Yes    Comment:  'once in a blue moon"  . Drug use: No  . Sexual activity: Not on file  Other Topics Concern  . Not on file  Social History Narrative  . Not on file   Social Determinants of Health   Financial Resource Strain:   . Difficulty of Paying Living Expenses:   Food Insecurity:   . Worried About Programme researcher, broadcasting/film/video in the Last Year:   . Barista in the Last Year:   Transportation Needs:   . Freight forwarder (Medical):   Marland Kitchen Lack of Transportation (Non-Medical):   Physical Activity:   . Days of Exercise per Week:   . Minutes of Exercise per Session:   Stress:   . Feeling of Stress :   Social Connections:   . Frequency of Communication with Friends and Family:   . Frequency of Social Gatherings with Friends and Family:   . Attends Religious Services:   . Active Member of Clubs or Organizations:   . Attends Banker Meetings:   Marland Kitchen Marital Status:     Review of Systems: Gen: Denies fever, chills, cold or flulike symptoms, lightheadedness, dizziness, presyncope, syncope. CV: Denies chest pain or palpitations Resp: Denies dyspnea or cough.  GI: See HPI Heme: See HPI  Physical  Exam: BP 130/87   Pulse 88   Temp (!) 97.3 F (36.3 C) (Temporal)   Ht 5\' 11"  (1.803 m)   Wt 153 lb 3.2 oz (69.5 kg)   BMI 21.37 kg/m  General:   Alert and oriented. No distress noted. Pleasant and cooperative.  Head:  Normocephalic and atraumatic. Eyes:  Conjuctiva clear without scleral icterus. Heart:  S1, S2 present without murmurs appreciated. Lungs:  Clear to auscultation bilaterally. No wheezes, rales, or rhonchi. No distress.  Abdomen:  +BS, soft, non-tender and non-distended. No rebound or guarding. No HSM or masses noted. Msk:  Symmetrical without gross deformities. Normal posture. Extremities:  Without edema. Neurologic:  Alert and  oriented x4 Psych: Normal mood and affect.

## 2020-04-27 ENCOUNTER — Encounter: Payer: Self-pay | Admitting: Gastroenterology

## 2020-04-27 ENCOUNTER — Ambulatory Visit (INDEPENDENT_AMBULATORY_CARE_PROVIDER_SITE_OTHER): Payer: Self-pay | Admitting: Gastroenterology

## 2020-04-27 ENCOUNTER — Other Ambulatory Visit: Payer: Self-pay

## 2020-04-27 VITALS — BP 130/87 | HR 88 | Temp 97.3°F | Ht 71.0 in | Wt 153.2 lb

## 2020-04-27 DIAGNOSIS — K5900 Constipation, unspecified: Secondary | ICD-10-CM

## 2020-04-27 DIAGNOSIS — K625 Hemorrhage of anus and rectum: Secondary | ICD-10-CM

## 2020-04-27 DIAGNOSIS — K219 Gastro-esophageal reflux disease without esophagitis: Secondary | ICD-10-CM

## 2020-04-27 NOTE — Patient Instructions (Addendum)
Continue taking Linzess 72 mcg daily 30 minutes before your first meal.  You may still add Benefiber or Metamucil daily.  Continue using hemorrhoid cream twice daily for the next 7 days, then stop.  Please let us know if you have return of rectal bleeding or rectal pain.  Limit toilet time to 2-3 minutes.  Avoid straining.  Continue taking Protonix 40 mg daily 30 minutes before breakfast.   Please call your primary care to discuss your trouble with urinating.  Please call Dr. Marice Potter office to schedule a follow-up appointment.  We will plan to see you back in 4 months.  Do not hesitate to call with questions or concerns prior.  Ermalinda Memos, PA-C [facility]

## 2020-04-27 NOTE — Assessment & Plan Note (Addendum)
Rectal bleeding has been in the presence of constipation with associated sharp rectal pain and burning at times.  Suspected bleeding secondary to hemorrhoids or possible anal fissure.  He was started on Linzess 72 mcg daily for constipation and [facility] hemorrhoid cream compounded with nitroglycerin in June 2021.  Rectal bleeding and rectal pain have resolved x1 week since starting Linzess routinely.  He is currently using hemorrhoid cream twice daily.  Plan: Continue [facility] hemorrhoid cream compounded with nitroglycerin x7 days then stop.  Advised to monitor for return of rectal bleeding or pain and let us know if this returns. Continue Linzess 72 mcg daily 30 minutes before first meal. Add Benefiber or Metamucil daily. Avoid straining. Limit toilet time to 2-3 minutes. Follow-up in 4 months.

## 2020-04-27 NOTE — Assessment & Plan Note (Addendum)
Chronic history of constipation.  Constipation has responded well to Linzess 72 mcg.  He has received patient assistance and started taking medication routinely about 1 week ago.  As constipation has improved, rectal pain and rectal bleeding have also resolved x 1 week.  No alarm symptoms.  Colonoscopy up-to-date in January 2020 with recommendations to repeat at age 39.  Plan: Continue Linzess 72 mcg daily 30 minutes before first meal. Continue [facility] hemorrhoid cream compounded with nitroglycerin twice daily x7 days then stop. Add Benefiber or Metamucil daily. Limit toilet time to 2-3 minutes.   Avoid straining. Follow-up in 4 months.  Advised to call if he has return of rectal pain or bleeding.

## 2020-04-27 NOTE — Assessment & Plan Note (Signed)
Well-controlled on Protonix 40 mg daily.  Advised to continue his current medications.  Follow-up in 4 months.

## 2020-05-29 ENCOUNTER — Telehealth (INDEPENDENT_AMBULATORY_CARE_PROVIDER_SITE_OTHER): Payer: PRIVATE HEALTH INSURANCE | Admitting: Nurse Practitioner

## 2020-05-29 DIAGNOSIS — L2489 Irritant contact dermatitis due to other agents: Secondary | ICD-10-CM

## 2020-05-29 MED ORDER — PREDNISONE 20 MG PO TABS: ORAL_TABLET | ORAL | 0 refills | Status: DC

## 2020-05-29 NOTE — Progress Notes (Signed)
   Virtual Visit via telephone Note Due to COVID-19 pandemic this visit was conducted virtually. This visit type was conducted due to national recommendations for restrictions regarding the COVID-19 Pandemic (e.g. social distancing, sheltering in place) in an effort to limit this patient's exposure and mitigate transmission in our community. All issues noted in this document were discussed and addressed.  A physical exam was not performed with this format.  I connected with Jacob Eaton on 05/29/20 at 1:25 by telephone and verified that I am speaking with the correct person using two identifiers. Jacob Eaton is currently located at home and nonoe is currently with hom during visit. The provider, Mary-Margaret Daphine Deutscher, FNP is located in their office at time of visit.  I discussed the limitations, risks, security and privacy concerns of performing an evaluation and management service by telephone and the availability of in person appointments. I also discussed with the patient that there may be a patient responsible charge related to this service. The patient expressed understanding and agreed to proceed.   History and Present Illness:   Chief Complaint: rash  HPI Patient calls in with rash on left forearm that is spreading up arm. He had similar rash on lower legs last week that was treated in ER and resolved. He thinks he is being exposed to something at work that is breaking him out. Rash is very itchy.   Review of Systems  Constitutional: Negative for diaphoresis and weight loss.  Eyes: Negative for blurred vision, double vision and pain.  Respiratory: Negative for shortness of breath.   Cardiovascular: Negative for chest pain, palpitations, orthopnea and leg swelling.  Gastrointestinal: Negative for abdominal pain.  Skin: Negative for rash.  Neurological: Negative for dizziness, sensory change, loss of consciousness, weakness and headaches.  Endo/Heme/Allergies: Negative for  polydipsia. Does not bruise/bleed easily.  Psychiatric/Behavioral: Negative for memory loss. The patient does not have insomnia.   All other systems reviewed and are negative.    Observations/Objective: Alert and oriented- answers all questions appropriately No distress Erythematous maculo papular rash in linear pattern to left forearm.  Assessment and Plan: Asad Keeven in today with chief complaint of No chief complaint on file.   1. Irritant contact dermatitis due to other agents Avoid scratching Cool compresses calamin or benadryl cream OTC Call if not improving - predniSONE (DELTASONE) 20 MG tablet; 2 po at sametime daily for 5 days  Dispense: 10 tablet; Refill: 0      Follow Up Instructions: prn    I discussed the assessment and treatment plan with the patient. The patient was provided an opportunity to ask questions and all were answered. The patient agreed with the plan and demonstrated an understanding of the instructions.   The patient was advised to call back or seek an in-person evaluation if the symptoms worsen or if the condition fails to improve as anticipated.  The above assessment and management plan was discussed with the patient. The patient verbalized understanding of and has agreed to the management plan. Patient is aware to call the clinic if symptoms persist or worsen. Patient is aware when to return to the clinic for a follow-up visit. Patient educated on when it is appropriate to go to the emergency department.   Time call ended:  1:40  I provided 15 minutes of non-face-to-face time during this encounter.    Mary-Margaret Daphine Deutscher, FNP

## 2020-07-16 ENCOUNTER — Telehealth: Payer: Self-pay | Admitting: *Deleted

## 2020-07-16 NOTE — Telephone Encounter (Signed)
Spoke with pt. Pt's order will be delivered in 8-10 business days. Samples of Linzess 72 mcg are ready for pickup until pts medication arrives.

## 2020-07-16 NOTE — Telephone Encounter (Signed)
Lmom, waiting on a return call.  

## 2020-07-16 NOTE — Telephone Encounter (Signed)
Pt wants to know if Linzess has came in.  Took last pill today.  [phone]

## 2020-07-23 NOTE — Telephone Encounter (Signed)
Patient's Linzess arrived from patient assistance(3 month supply) today.  I tried to call the patient, no answer, lmom

## 2020-08-27 ENCOUNTER — Ambulatory Visit: Payer: Self-pay | Admitting: Gastroenterology

## 2020-09-23 ENCOUNTER — Other Ambulatory Visit: Payer: Self-pay

## 2020-09-23 ENCOUNTER — Encounter (HOSPITAL_COMMUNITY): Payer: Self-pay | Admitting: Emergency Medicine

## 2020-09-23 DIAGNOSIS — M779 Enthesopathy, unspecified: Secondary | ICD-10-CM | POA: Insufficient documentation

## 2020-09-23 DIAGNOSIS — Z7982 Long term (current) use of aspirin: Secondary | ICD-10-CM | POA: Insufficient documentation

## 2020-09-23 DIAGNOSIS — Z87891 Personal history of nicotine dependence: Secondary | ICD-10-CM | POA: Insufficient documentation

## 2020-09-23 DIAGNOSIS — Z79899 Other long term (current) drug therapy: Secondary | ICD-10-CM | POA: Insufficient documentation

## 2020-09-23 NOTE — ED Triage Notes (Signed)
Pt c/o left hand pain with obvious swelling to the back of his hand.

## 2020-09-24 ENCOUNTER — Emergency Department (HOSPITAL_COMMUNITY): Payer: Self-pay

## 2020-09-24 ENCOUNTER — Emergency Department (HOSPITAL_COMMUNITY)
Admission: EM | Admit: 2020-09-24 | Discharge: 2020-09-24 | Disposition: A | Discharge status: Home / Self Care | Payer: Self-pay | Attending: Emergency Medicine | Admitting: Emergency Medicine

## 2020-09-24 DIAGNOSIS — M779 Enthesopathy, unspecified: Secondary | ICD-10-CM

## 2020-09-24 MED ORDER — DOXYCYCLINE HYCLATE 100 MG PO CAPS
100.0000 mg | ORAL_CAPSULE | Freq: Two times a day (BID) | ORAL | 0 refills | Status: DC
Start: 2020-09-24 — End: 2020-11-19

## 2020-09-24 MED ORDER — METHYLPREDNISOLONE 4 MG PO TBPK
ORAL_TABLET | ORAL | 0 refills | Status: DC
Start: 2020-09-24 — End: 2020-11-19

## 2020-09-24 NOTE — ED Provider Notes (Signed)
[facility] EMERGENCY DEPARTMENT Provider Note   CSN: [id number] Arrival date & time: 09/23/20  2119     History Chief Complaint  Patient presents with  . Hand Pain    Jacob Eaton is a 40 y.o. male.  Patient presents to the emergency department for evaluation of swelling of his left hand.  Patient reports associated pain.  He is unaware of any injury.  Symptoms present for a couple of days.  Pain worsens with movement, especially bending the wrist.        Past Medical History:  Diagnosis Date  . Chronic tension headaches   . Constipation   . Depression   . GERD (gastroesophageal reflux disease)   . Hemochromatosis   . Hemorrhoids    Documented on colonoscopy in January 2020.    Patient Active Problem List   Diagnosis Date Noted  . Cough 04/30/2019  . Migraine 10/19/2018  . Hereditary hemochromatosis (HCC) 09/11/2018  . High hepatic iron concentration determined by biopsy of liver 08/24/2018  . Elevated LFTs 08/08/2018  . Rectal bleeding 08/08/2018  . Constipation 02/01/2018  . Rectal discomfort 11/29/2017  . GERD (gastroesophageal reflux disease) 05/31/2017  . 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 Elevated alkaline phosphatase level 08/16/2016  . Edentulous 07/26/2013    Past Surgical History:  Procedure Laterality Date  . BIOPSY  10/08/2018   Procedure: BIOPSY;  Surgeon: West Bali, MD;  Location: [facility];  Service: Endoscopy;;  sigmoid and rectal bx's  . COLONOSCOPY N/A 10/08/2018   Dr. Darrick Penna: external and internal hemorrhoids, anal fissure.  Repeat at age 3.  Marland Kitchen ESOPHAGEAL ATRESIA REPAIR  1990       Family History  Problem Relation Age of Onset  . Seizures Mother   . Heart disease Father   . Cancer Father   . Asthma Sister   . Colon cancer Neg Hx   . Colon polyps Neg Hx     Social History   Tobacco Use  . Smoking status: Former Smoker    Types: Cigarettes    Start date: 09/20/1999    Quit date: 09/19/2000    Years since  quitting: 20.0  . Smokeless tobacco: Never Used  Vaping Use  . Vaping Use: Never used  Substance Use Topics  . Alcohol use: Yes    Comment:  'once in a blue moon"  . Drug use: No    Home Medications Prior to Admission medications   Medication Sig Start Date End Date Taking? Authorizing Provider  doxycycline (VIBRAMYCIN) 100 MG capsule Take 1 capsule (100 mg total) by mouth 2 (two) times daily. 09/24/20  Yes Gilda Crease, MD  methylPREDNISolone (MEDROL DOSEPAK) 4 MG TBPK tablet As directed 09/24/20  Yes Ayjah Show, Canary Brim, MD  amitriptyline (ELAVIL) 50 MG tablet TAKE 1 TABLET BY MOUTH AT BEDTIME 11/26/19   Sonny Masters, FNP  aspirin-acetaminophen-caffeine (EXCEDRIN MIGRAINE) [phone] MG tablet Take 2 tablets by mouth as needed for headache.     [provider]  Cholecalciferol (VITAMIN D3) 25 MCG (1000 UT) CAPS Take by mouth.    [provider]  linaclotide (LINZESS) 72 MCG capsule Take 72 mcg by mouth daily before breakfast.    [provider]  OVER THE COUNTER MEDICATION every other day. Prune tablets takes 2 tablets daily     [provider]  pantoprazole (PROTONIX) 40 MG tablet TAKE 1 TABLET BY MOUTH EVERY DAY 10/10/19   Tiffany Kocher, PA-C  predniSONE (DELTASONE) 20 MG tablet 2  po at sametime daily for 5 days 05/29/20   Chevis Pretty, FNP  rizatriptan (MAXALT) 10 MG tablet TAKE 1 TABLET BY MOUTH AS NEEDED FOR migraine (MAY REPEAT in TWO hours if needed) 09/09/19   Rakes, Connye Burkitt, FNP  vitamin B-12 (CYANOCOBALAMIN) 1000 MCG tablet Take 1,000 mcg by mouth daily.    [provider]    Allergies    Naproxen  Review of Systems   Review of Systems  Musculoskeletal: Positive for arthralgias.  Skin: Negative.     Physical Exam Updated Vital Signs BP 125/82   Pulse 95   Temp 98.1 F (36.7 C) (Oral)   Resp 19   Ht 5\' 11"  (1.803 m)   Wt 68.9 kg   SpO2 100%   BMI 21.20 kg/m   Physical Exam Vitals and nursing  note reviewed.  HENT:     Head: Normocephalic.  Musculoskeletal:     Left wrist: No swelling, deformity or tenderness. Decreased range of motion (pain with extension of wrist).       Hands:     Comments: Normal flexion-extension of fingers other than chronic deformities of second and fifth finger.  Skin:    Findings: No erythema, rash or wound.  Neurological:     Mental Status: He is alert.     Sensory: Sensation is intact.     Motor: Motor function is intact.     ED Results / Procedures / Treatments   Labs (all labs ordered are listed, but only abnormal results are displayed) Labs Reviewed - No data to display  EKG None  Radiology DG Hand Complete Left  Result Date: 09/24/2020 CLINICAL DATA:  Posterior left hand pain and swelling since yesterday, chronic fifth digit deformity EXAM: LEFT HAND - COMPLETE 3+ VIEW COMPARISON:  None. FINDINGS: Frontal, oblique, and lateral views of the left hand are obtained. Flexion deformity of the fifth PIP and extension of the fifth D IP compatible with chronic deformity. Otherwise alignment is anatomic. No acute displaced fractures. Chronic posttraumatic changes of the second middle phalanx. Dorsal soft tissue swelling of the left hand. IMPRESSION: 1. Dorsal soft tissue swelling.  No acute fracture. 2. 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 Electronically Signed   By: Randa Ngo M.D.   On: 09/24/2020 01:45    Procedures Procedures (including critical care time)  Medications Ordered in ED Medications - No data to display  ED Course  I have reviewed the triage vital signs and the nursing notes.  Pertinent labs & imaging results that were available during my care of the patient were reviewed by me and considered in my medical decision making (see chart for details).    MDM Rules/Calculators/A&P                          Patient with mild and diffuse swelling of the dorsal aspect of the left hand.  No  associated erythema, warmth or rash.  No focal masses or fluctuance.  No induration.  Patient does have pain with full extension of the left wrist, normal range of motion of the fingers preserved.  Suspect swelling is secondary to tendinitis but cannot rule out early infection.  There are, however, no signs of infection currently.  Will treat with prednisone and antibiotics, return if symptoms worsen.  Final Clinical Impression(s) / ED Diagnoses Final diagnoses:  Tendinitis    Rx / DC Orders ED Discharge Orders  Ordered    methylPREDNISolone (MEDROL DOSEPAK) 4 MG TBPK tablet        09/24/20 0245    doxycycline (VIBRAMYCIN) 100 MG capsule  2 times daily        09/24/20 0245           Gilda Crease, MD 09/24/20 [phone]

## 2020-10-13 ENCOUNTER — Other Ambulatory Visit: Payer: Self-pay | Admitting: *Deleted

## 2020-10-13 DIAGNOSIS — G43719 Chronic migraine without aura, intractable, without status migrainosus: Secondary | ICD-10-CM

## 2020-11-02 ENCOUNTER — Other Ambulatory Visit: Payer: Self-pay | Admitting: *Deleted

## 2020-11-02 DIAGNOSIS — G43719 Chronic migraine without aura, intractable, without status migrainosus: Secondary | ICD-10-CM

## 2020-11-09 ENCOUNTER — Other Ambulatory Visit: Payer: Self-pay | Admitting: Gastroenterology

## 2020-11-10 ENCOUNTER — Other Ambulatory Visit: Payer: Self-pay | Admitting: *Deleted

## 2020-11-10 ENCOUNTER — Telehealth: Payer: Self-pay | Admitting: Internal Medicine

## 2020-11-10 DIAGNOSIS — G43709 Chronic migraine without aura, not intractable, without status migrainosus: Secondary | ICD-10-CM

## 2020-11-10 DIAGNOSIS — G43719 Chronic migraine without aura, intractable, without status migrainosus: Secondary | ICD-10-CM

## 2020-11-10 MED ORDER — RIZATRIPTAN BENZOATE 10 MG PO TABS
ORAL_TABLET | ORAL | 0 refills | Status: DC
Start: 2020-11-10 — End: 2020-11-19

## 2020-11-10 MED ORDER — AMITRIPTYLINE HCL 50 MG PO TABS
50.0000 mg | ORAL_TABLET | Freq: Every day | ORAL | 0 refills | Status: DC
Start: 2020-11-10 — End: 2020-11-19

## 2020-11-10 NOTE — Telephone Encounter (Signed)
Phoned to [facility] @ [phone] and requested a refill for the pt to get his Linzess 72 mcg. (it will arrive in 7 to10 business days). Also the pt only has one refill left. Phoned the pt and advised that his medication will be here in 7 to 10 business days. The pt will have one refill left after this one he picks up.       He also advised me that he has insurance now ([employer]). I advised when he comes in for his appt to please bring his card to be scanned into the system. He agreed.

## 2020-11-10 NOTE — Telephone Encounter (Signed)
PATIENT CALLED AND SAID THAT HE IS ALMOST OUT OF LINZESS AND WOULD LIKE FOR [place] TO SEND IT IN FOR REFILL   HE STATED HE USUALLY COMES HERE AND PICKS IT UP

## 2020-11-16 NOTE — Telephone Encounter (Signed)
Linzess 72 mcg has arrived from [facility]. Left a detailed message for pt. Medication is ready for pickup.

## 2020-11-19 ENCOUNTER — Ambulatory Visit (INDEPENDENT_AMBULATORY_CARE_PROVIDER_SITE_OTHER): Payer: Commercial Managed Care - PPO | Admitting: Family Medicine

## 2020-11-19 ENCOUNTER — Other Ambulatory Visit: Payer: Self-pay

## 2020-11-19 ENCOUNTER — Encounter: Payer: Self-pay | Admitting: Family Medicine

## 2020-11-19 VITALS — BP 126/85 | HR 81 | Temp 98.3°F | Ht 71.0 in | Wt 153.1 lb

## 2020-11-19 DIAGNOSIS — Z0001 Encounter for general adult medical examination with abnormal findings: Secondary | ICD-10-CM

## 2020-11-19 DIAGNOSIS — Z0000 Encounter for general adult medical examination without abnormal findings: Secondary | ICD-10-CM

## 2020-11-19 DIAGNOSIS — L2489 Irritant contact dermatitis due to other agents: Secondary | ICD-10-CM

## 2020-11-19 DIAGNOSIS — G43709 Chronic migraine without aura, not intractable, without status migrainosus: Secondary | ICD-10-CM

## 2020-11-19 DIAGNOSIS — K219 Gastro-esophageal reflux disease without esophagitis: Secondary | ICD-10-CM

## 2020-11-19 MED ORDER — RIZATRIPTAN BENZOATE 10 MG PO TABS
ORAL_TABLET | ORAL | 0 refills | Status: DC
Start: 2020-11-19 — End: 2021-05-20

## 2020-11-19 MED ORDER — AMITRIPTYLINE HCL 50 MG PO TABS
50.0000 mg | ORAL_TABLET | Freq: Every day | ORAL | 0 refills | Status: DC
Start: 2020-11-19 — End: 2021-05-20

## 2020-11-19 MED ORDER — TRIAMCINOLONE ACETONIDE 0.1 % EX CREA: 1.0000 "application " | TOPICAL_CREAM | Freq: Two times a day (BID) | CUTANEOUS | 2 refills | Status: DC

## 2020-11-19 NOTE — Patient Instructions (Signed)

## 2020-11-19 NOTE — Progress Notes (Signed)
Jacob Eaton is a 40 y.o. male presents to office today for annual physical exam examination.    Concerns today include: 1. Rash Jacob Eaton reports a rash to both lower legs. 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 He has tried hydrocortisone cream with some improvement. He uses soap for sensitive skin. It is itchy. It has not spread. Denies fever or drainage.   Diet: lean meats, beans, corn, peas, Exercise: active at work Last eye exam: no Last dental exam: hasn't been in awhile  Refills needed today: migraine medications  He will see his GI later this month for GERD. He will schedule an appointment with oncology for hemochromatosis.   Past Medical History:  Diagnosis Date  . Chronic tension headaches   . Constipation   . Depression   . GERD (gastroesophageal reflux disease)   . Hemochromatosis   . Hemorrhoids    Documented on colonoscopy in January 2020.   Social History   Socioeconomic History  . Marital status: Single    Spouse name: Not on file  . Number of children: Not on file  . Years of education: Not on file  . Highest education level: Not on file  Occupational History  . Not on file  Tobacco Use  . Smoking status: Former Smoker    Types: Cigarettes    Start date: 09/20/1999    Quit date: 09/19/2000    Years since quitting: 20.1  . Smokeless tobacco: Never Used  Vaping Use  . Vaping Use: Never used  Substance and Sexual Activity  . Alcohol use: Yes    Comment:  'once in a blue moon"  . Drug use: No  . Sexual activity: Not on file  Other Topics Concern  . Not on file  Social History Narrative  . Not on file   Social Determinants of Health   Financial Resource Strain: Not on file  Food Insecurity: Not on file  Transportation Needs: Not on file  Physical Activity: Not on file  Stress: Not on file  Social Connections: Not on file  Intimate Partner Violence: Not on file   Past Surgical  History:  Procedure Laterality Date  . BIOPSY  10/08/2018   Procedure: BIOPSY;  Surgeon: Jacob Binder, MD;  Location: [facility];  Service: Endoscopy;;  sigmoid and rectal bx's  . COLONOSCOPY N/A 10/08/2018   Dr. Oneida Eaton: external and internal hemorrhoids, anal fissure.  Repeat at age 86.  Marland Kitchen ESOPHAGEAL ATRESIA REPAIR  1990   Family History  Problem Relation Age of Onset  . Seizures Mother   . Heart disease Father   . Cancer Father   . Asthma Sister   . Colon cancer Neg Hx   . Colon polyps Neg Hx     Current Outpatient Medications:  .  amitriptyline (Jacob Eaton) 50 MG tablet, Take 1 tablet (50 mg total) by mouth at bedtime., Disp: 30 tablet, Rfl: 0 .  aspirin-acetaminophen-caffeine (EXCEDRIN MIGRAINE) 250-250-65 MG tablet, Take 2 tablets by mouth as needed for headache. , Disp: , Rfl:  .  Cholecalciferol (VITAMIN D3) 25 MCG (1000 UT) CAPS, Take by mouth., Disp: , Rfl:  .  linaclotide (LINZESS) 72 MCG capsule, Take 72 mcg by mouth daily before breakfast., Disp: , Rfl:  .  OVER THE COUNTER MEDICATION, every other day. Prune tablets takes 2 tablets daily, Disp: , Rfl:  .  pantoprazole (PROTONIX) 40 MG tablet, TAKE 1 TABLET BY MOUTH EVERY  DAY, Disp: 90 tablet, Rfl: 2 .  rizatriptan (MAXALT) 10 MG tablet, TAKE 1 TABLET BY MOUTH AS NEEDED FOR migraine (MAY REPEAT in TWO hours if needed), Disp: 10 tablet, Rfl: 0  Allergies  Allergen Reactions  . Naproxen Hives  . Permethrin Hives     ROS: Review of Systems Pertinent items noted in HPI and remainder of comprehensive ROS otherwise negative.    Physical exam BP 126/85   Pulse 81   Temp 98.3 F (36.8 C) (Temporal)   Ht $R'5\' 11"'PY$  (1.803 m)   Wt 153 lb 2 oz (69.5 kg)   BMI 21.36 kg/m  General appearance: alert, cooperative and no distress Head: Normocephalic, without obvious abnormality, atraumatic Eyes: conjunctivae/corneas clear. PERRL, EOM's intact. Fundi benign. Ears: normal TM's and external ear canals both ears Nose: Nares  normal. Septum midline. Mucosa normal. No drainage or sinus tenderness. Throat: lips, mucosa, and tongue normal; teeth and gums normal Neck: no adenopathy, no carotid bruit, no JVD, supple, symmetrical, trachea midline and thyroid not enlarged, symmetric, no tenderness/mass/nodules Back: symmetric, no curvature. ROM normal. No CVA tenderness. Lungs: clear to auscultation bilaterally Heart: regular rate and rhythm, S1, S2 normal, no murmur, click, rub or gallop Abdomen: soft, non-tender; bowel sounds normal; no masses,  no organomegaly Extremities: extremities normal, atraumatic, no cyanosis or edema Pulses: 2+ and symmetric Skin: Skin color, texture, turgor normal. Rash present to bilateral lower legs consistent with contact dermatitis.  Lymph nodes: Cervical, supraclavicular, and axillary nodes normal. Neurologic: Alert and oriented X 3, normal strength and tone. Normal symmetric reflexes. Normal coordination and gait    Assessment/ Plan:  Jacob Eaton was seen today for annual exam.  Diagnoses and all orders for this visit:  Routine general medical examination at a health care facility Labs pending as below.  -     CMP14+EGFR -     CBC with Differential/Platelet -     Lipid panel -     TSH -     Vitamin B12 -     VITAMIN D 25 Hydroxy (Vit-D Deficiency, Fractures)  Irritant contact dermatitis due to other agents Try kenalog cream. Return to office for new or worsening symptoms, or if symptoms persist.  -     triamcinolone (KENALOG) 0.1 %; Apply 1 application topically 2 (two) times daily.  Chronic migraine without aura without status migrainosus, not intractable Well controlled on current regimen.  -     rizatriptan (MAXALT) 10 MG tablet; TAKE 1 TABLET BY MOUTH AS NEEDED FOR migraine (MAY REPEAT in TWO hours if needed) -     amitriptyline (Jacob Eaton) 50 MG tablet; Take 1 tablet (50 mg total) by mouth at bedtime.  Hereditary hemochromatosis (Jacob Eaton) Managed by hematology. Patient will  schedule appointment for follow up.  Gastroesophageal reflux disease without esophagitis Managed by GI. Reports well controlled.    Counseled on healthy lifestyle choices, including diet (rich in fruits, vegetables and lean meats and low in salt and simple carbohydrates) and exercise (at least 30 minutes of moderate physical activity daily).  Patient to follow up in 1 year for annual exam and in 6 months for chronic follow up.   The above assessment and management plan was discussed with the patient. The patient verbalized understanding of and has agreed to the management plan. Patient is aware to call the clinic if symptoms persist or worsen. Patient is aware when to return to the clinic for a follow-up visit. Patient educated on when it is appropriate to go to the  emergency department.   Marjorie Smolder, FNP-C [facility] [address] [place], [place] [address] [phone]

## 2020-11-20 LAB — CBC WITH DIFFERENTIAL/PLATELET
Basophils Absolute: 0 10*3/uL (ref 0.0–0.2)
Basos: 1 %
EOS (ABSOLUTE): 0.1 10*3/uL (ref 0.0–0.4)
Eos: 2 %
Hematocrit: 43.8 % (ref 37.5–51.0)
Hemoglobin: 14.8 g/dL (ref 13.0–17.7)
Immature Grans (Abs): 0 10*3/uL (ref 0.0–0.1)
Immature Granulocytes: 0 %
Lymphocytes Absolute: 0.8 10*3/uL (ref 0.7–3.1)
Lymphs: 16 %
MCH: 31.9 pg (ref 26.6–33.0)
MCHC: 33.8 g/dL (ref 31.5–35.7)
MCV: 94 fL (ref 79–97)
Monocytes Absolute: 0.8 10*3/uL (ref 0.1–0.9)
Monocytes: 16 %
Neutrophils Absolute: 3.2 10*3/uL (ref 1.4–7.0)
Neutrophils: 65 %
Platelets: 205 10*3/uL (ref 150–450)
RBC: 4.64 x10E6/uL (ref 4.14–5.80)
RDW: 11.9 % (ref 11.6–15.4)
WBC: 4.9 10*3/uL (ref 3.4–10.8)

## 2020-11-20 LAB — LIPID PANEL
Chol/HDL Ratio: 4.6 ratio (ref 0.0–5.0)
Cholesterol, Total: 274 mg/dL — ABNORMAL HIGH (ref 100–199)
HDL: 60 mg/dL (ref >39)
LDL Chol Calc (NIH): 200 mg/dL — ABNORMAL HIGH (ref 0–99)
Triglycerides: 83 mg/dL (ref 0–149)
VLDL Cholesterol Cal: 14 mg/dL (ref 5–40)

## 2020-11-20 LAB — CMP14+EGFR
ALT: 55 IU/L — ABNORMAL HIGH (ref 0–44)
AST: 39 IU/L (ref 0–40)
Albumin/Globulin Ratio: 1.9 (ref 1.2–2.2)
Albumin: 4.5 g/dL (ref 4.0–5.0)
Alkaline Phosphatase: 338 IU/L — ABNORMAL HIGH (ref 44–121)
BUN/Creatinine Ratio: 11 (ref 9–20)
BUN: 10 mg/dL (ref 6–24)
Bilirubin Total: 0.3 mg/dL (ref 0.0–1.2)
CO2: 21 mmol/L (ref 20–29)
Calcium: 9.5 mg/dL (ref 8.7–10.2)
Chloride: 102 mmol/L (ref 96–106)
Creatinine, Ser: 0.91 mg/dL (ref 0.76–1.27)
Globulin, Total: 2.4 g/dL (ref 1.5–4.5)
Glucose: 84 mg/dL (ref 65–99)
Potassium: 4.4 mmol/L (ref 3.5–5.2)
Sodium: 138 mmol/L (ref 134–144)
Total Protein: 6.9 g/dL (ref 6.0–8.5)
eGFR: 109 mL/min/{1.73_m2} (ref >59)

## 2020-11-20 LAB — VITAMIN D 25 HYDROXY (VIT D DEFICIENCY, FRACTURES): Vit D, 25-Hydroxy: 24.1 ng/mL — ABNORMAL LOW (ref 30.0–100.0)

## 2020-11-20 LAB — TSH: TSH: 0.944 u[IU]/mL (ref 0.450–4.500)

## 2020-11-20 LAB — VITAMIN B12: Vitamin B-12: 552 pg/mL (ref 232–1245)
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
## 2020-12-16 ENCOUNTER — Ambulatory Visit (INDEPENDENT_AMBULATORY_CARE_PROVIDER_SITE_OTHER): Payer: Commercial Managed Care - PPO | Admitting: Gastroenterology

## 2020-12-16 ENCOUNTER — Other Ambulatory Visit: Payer: Self-pay

## 2020-12-16 ENCOUNTER — Encounter: Payer: Self-pay | Admitting: Gastroenterology

## 2020-12-16 ENCOUNTER — Other Ambulatory Visit (HOSPITAL_COMMUNITY)
Admission: RE | Admit: 2020-12-16 | Discharge: 2020-12-16 | Disposition: A | Discharge status: Home / Self Care | Payer: Commercial Managed Care - PPO | Source: Ambulatory Visit | Attending: Gastroenterology | Admitting: Gastroenterology

## 2020-12-16 VITALS — BP 115/78 | HR 90 | Temp 97.7°F | Ht 71.0 in | Wt 156.6 lb

## 2020-12-16 DIAGNOSIS — R7989 Other specified abnormal findings of blood chemistry: Secondary | ICD-10-CM | POA: Insufficient documentation

## 2020-12-16 DIAGNOSIS — K625 Hemorrhage of anus and rectum: Secondary | ICD-10-CM | POA: Diagnosis not present

## 2020-12-16 DIAGNOSIS — K5900 Constipation, unspecified: Secondary | ICD-10-CM | POA: Diagnosis not present

## 2020-12-16 DIAGNOSIS — K219 Gastro-esophageal reflux disease without esophagitis: Secondary | ICD-10-CM

## 2020-12-16 LAB — HEPATIC FUNCTION PANEL
ALT: 53 U/L — ABNORMAL HIGH (ref 0–44)
AST: 30 U/L (ref 15–41)
Albumin: 4.4 g/dL (ref 3.5–5.0)
Alkaline Phosphatase: 252 U/L — ABNORMAL HIGH (ref 38–126)
Bilirubin, Direct: 0.1 mg/dL (ref 0.0–0.2)
Indirect Bilirubin: 0.5 mg/dL (ref 0.3–0.9)
Total Bilirubin: 0.6 mg/dL (ref 0.3–1.2)
Total Protein: 7.2 g/dL (ref 6.5–8.1)

## 2020-12-16 LAB — IRON AND TIBC
Iron: 181 ug/dL (ref 45–182)
Saturation Ratios: 51 % — ABNORMAL HIGH (ref 17.9–39.5)
TIBC: 356 ug/dL (ref 250–450)
UIBC: 175 ug/dL

## 2020-12-16 LAB — FERRITIN: Ferritin: 15 ng/mL — ABNORMAL LOW (ref 24–336)

## 2020-12-16 NOTE — Patient Instructions (Addendum)
Please have blood work completed at [facility].   I will send a message to Mathis Bud, NP about getting you back in to see them. Please also reach out to them to try to schedule a follow-up.   Please limit tylenol and Excedrin as much as possible.   For constipation: Try taking 2 Linzess 72 mcg in the morning empty stomach to see if this helps manage her constipation little better. Call with a progress report in 2 weeks. If this works well, I will send in a new prescription for the higher dose.   For hemorrhoids: Continue to limit toilet x2-3 minutes. Avoid straining. Continue using Preparation H as needed twice daily for 5-7 days.  For acid reflux: Continue taking Protonix 40 mg daily 30 minutes before breakfast. Follow a GERD diet:   Avoid fried, fatty, greasy, spicy, citrus foods.  Avoid caffeine and carbonated beverages.  Avoid chocolate.  Try eating 4-6 small meals a day rather than 3 large meals.  Do not eat within 3 hours of laying down.  Prop head of bed up on wood or bricks to create a 6 inch incline.  We will see you back in 3 months. Please call with questions or concerns prior.

## 2020-12-17 ENCOUNTER — Encounter: Payer: Self-pay | Admitting: Gastroenterology

## 2020-12-17 NOTE — Assessment & Plan Note (Addendum)
Chronic.  Improved with Linzess 72 mcg daily but still room for improvement.  Continues with intermittent rectal bleeding in the setting of known hemorrhoids as discussed above.  Suspect this will improve with better management of constipation.  Advised that he try taking 2 Linzess in the morning to see if this works better for him.  Requested progress report in 1-2 weeks.  Follow-up in 3 months.

## 2020-12-17 NOTE — Assessment & Plan Note (Signed)
Addressed under elevated LFTs. 

## 2020-12-17 NOTE — Assessment & Plan Note (Addendum)
Chronic history of intermittent bright red blood per rectum in the setting of constipation and known hemorrhoids with colonoscopy in January 2020.  Also history of anal fissure, but he is denying any rectal pain at this time.  Constipation not adequately managed on Linzess 72 mcg daily.  I have advised that he try taking 2 Linzess in the morning to see if this works better for him.  Requested progress report in 1-2 weeks.  Also advised to continue using Preparation H twice daily for 5-7 days at a time, limit toilet  To 2-3 minutes, and avoiding straining.  Suspect rectal bleeding will improve with better management of constipation. We discussed the possibility of banding internal hemorrhoids, but he prefers to monitor for now.
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
## 2020-12-17 NOTE — Assessment & Plan Note (Addendum)
Well-controlled on Protonix 40 mg daily.  No alarm symptoms.  Advised to continue his current medications.  Also reinforced GERD diet/lifestyle and written instructions were provided.

## 2020-12-31 ENCOUNTER — Encounter: Payer: Self-pay | Admitting: Family Medicine

## 2021-01-14 ENCOUNTER — Telehealth: Payer: Self-pay | Admitting: Internal Medicine

## 2021-01-14 ENCOUNTER — Other Ambulatory Visit: Payer: Self-pay | Admitting: Gastroenterology

## 2021-01-14 ENCOUNTER — Telehealth: Payer: Self-pay

## 2021-01-14 DIAGNOSIS — K5904 Chronic idiopathic constipation: Secondary | ICD-10-CM

## 2021-01-14 DIAGNOSIS — K219 Gastro-esophageal reflux disease without esophagitis: Secondary | ICD-10-CM

## 2021-01-14 MED ORDER — PANTOPRAZOLE SODIUM 40 MG PO TBEC: 1.0000 | DELAYED_RELEASE_TABLET | Freq: Every day | ORAL | 3 refills | Status: DC

## 2021-01-14 MED ORDER — LINACLOTIDE 145 MCG PO CAPS: 145.0000 ug | ORAL_CAPSULE | Freq: Every day | ORAL | 1 refills | Status: DC

## 2021-01-14 NOTE — Telephone Encounter (Signed)
Rx sent 

## 2021-01-14 NOTE — Telephone Encounter (Signed)
Phoned and advised the pt his Rx's have been sent to [facility]

## 2021-01-14 NOTE — Telephone Encounter (Signed)
The pt is stating that taking 2 linzess works better for him so he would like a new Rx sent in and also a refill on Pantoprazole. The pt uses [facility]. This was sent to both because I know the Dr who last seen pt does all new Rx's and the pt wanted a refill on the other.

## 2021-01-14 NOTE — Telephone Encounter (Signed)
Pt is approved for Linzess Cap 145 mcg 01/14/2021----01/14/2022.  [id number]. Will contact [facility] and the pt and advise of this. Will give to Darl Pikes to scan into the chart.

## 2021-01-14 NOTE — Telephone Encounter (Signed)
Patient called and said the increased dose of Linzess is working and he needs a prescription sent to his pharmacy    He also needs his Pantoprozole sent in as well.   He uses [facility]

## 2021-01-19 ENCOUNTER — Encounter: Payer: Self-pay | Admitting: Internal Medicine

## 2021-01-22 ENCOUNTER — Other Ambulatory Visit (HOSPITAL_COMMUNITY): Payer: Self-pay | Admitting: *Deleted

## 2021-01-25 ENCOUNTER — Other Ambulatory Visit: Payer: Self-pay

## 2021-01-25 ENCOUNTER — Inpatient Hospital Stay (HOSPITAL_COMMUNITY): Payer: Commercial Managed Care - PPO | Attending: Hematology

## 2021-01-25 DIAGNOSIS — Z886 Allergy status to analgesic agent status: Secondary | ICD-10-CM | POA: Insufficient documentation

## 2021-01-25 DIAGNOSIS — Z79899 Other long term (current) drug therapy: Secondary | ICD-10-CM | POA: Insufficient documentation

## 2021-01-25 DIAGNOSIS — Z8249 Family history of ischemic heart disease and other diseases of the circulatory system: Secondary | ICD-10-CM | POA: Insufficient documentation

## 2021-01-25 DIAGNOSIS — R5383 Other fatigue: Secondary | ICD-10-CM | POA: Diagnosis not present

## 2021-01-25 DIAGNOSIS — Z820 Family history of epilepsy and other diseases of the nervous system: Secondary | ICD-10-CM | POA: Insufficient documentation

## 2021-01-25 DIAGNOSIS — M25569 Pain in unspecified knee: Secondary | ICD-10-CM | POA: Insufficient documentation

## 2021-01-25 DIAGNOSIS — Z8719 Personal history of other diseases of the digestive system: Secondary | ICD-10-CM | POA: Insufficient documentation

## 2021-01-25 DIAGNOSIS — Z836 Family history of other diseases of the respiratory system: Secondary | ICD-10-CM | POA: Diagnosis not present

## 2021-01-25 DIAGNOSIS — E559 Vitamin D deficiency, unspecified: Secondary | ICD-10-CM | POA: Insufficient documentation

## 2021-01-25 DIAGNOSIS — Z87891 Personal history of nicotine dependence: Secondary | ICD-10-CM | POA: Insufficient documentation

## 2021-01-25 DIAGNOSIS — K219 Gastro-esophageal reflux disease without esophagitis: Secondary | ICD-10-CM | POA: Insufficient documentation

## 2021-01-25 DIAGNOSIS — Z809 Family history of malignant neoplasm, unspecified: Secondary | ICD-10-CM | POA: Diagnosis not present

## 2021-01-25 LAB — COMPREHENSIVE METABOLIC PANEL
ALT: 61 U/L — ABNORMAL HIGH (ref 0–44)
AST: 58 U/L — ABNORMAL HIGH (ref 15–41)
Albumin: 4.6 g/dL (ref 3.5–5.0)
Alkaline Phosphatase: 253 U/L — ABNORMAL HIGH (ref 38–126)
Anion gap: 7 (ref 5–15)
BUN: 10 mg/dL (ref 6–20)
CO2: 28 mmol/L (ref 22–32)
Calcium: 9.2 mg/dL (ref 8.9–10.3)
Chloride: 101 mmol/L (ref 98–111)
Creatinine, Ser: 0.86 mg/dL (ref 0.61–1.24)
GFR, Estimated: >60 mL/min (ref >60)
Glucose, Bld: 98 mg/dL (ref 70–99)
Potassium: 3.7 mmol/L (ref 3.5–5.1)
Sodium: 136 mmol/L (ref 135–145)
Total Bilirubin: 0.7 mg/dL (ref 0.3–1.2)
Total Protein: 7.5 g/dL (ref 6.5–8.1)

## 2021-01-25 LAB — CBC WITH DIFFERENTIAL/PLATELET
Abs Immature Granulocytes: 0.01 10*3/uL (ref 0.00–0.07)
Basophils Absolute: 0 10*3/uL (ref 0.0–0.1)
Basophils Relative: 1 %
Eosinophils Absolute: 0.1 10*3/uL (ref 0.0–0.5)
Eosinophils Relative: 3 %
HCT: 46.8 % (ref 39.0–52.0)
Hemoglobin: 15.5 g/dL (ref 13.0–17.0)
Immature Granulocytes: 0 %
Lymphocytes Relative: 21 %
Lymphs Abs: 0.8 10*3/uL (ref 0.7–4.0)
MCH: 32.3 pg (ref 26.0–34.0)
MCHC: 33.1 g/dL (ref 30.0–36.0)
MCV: 97.5 fL (ref 80.0–100.0)
Monocytes Absolute: 0.6 10*3/uL (ref 0.1–1.0)
Monocytes Relative: 15 %
Neutro Abs: 2.4 10*3/uL (ref 1.7–7.7)
Neutrophils Relative %: 60 %
Platelets: 200 10*3/uL (ref 150–400)
RBC: 4.8 MIL/uL (ref 4.22–5.81)
RDW: 13.4 % (ref 11.5–15.5)
WBC: 4 10*3/uL (ref 4.0–10.5)
nRBC: 0 % (ref 0.0–0.2)

## 2021-01-25 LAB — VITAMIN B12: Vitamin B-12: 524 pg/mL (ref 180–914)

## 2021-01-25 LAB — FERRITIN: Ferritin: 16 ng/mL — ABNORMAL LOW (ref 24–336)

## 2021-01-25 LAB — VITAMIN D 25 HYDROXY (VIT D DEFICIENCY, FRACTURES): Vit D, 25-Hydroxy: 20.25 ng/mL — ABNORMAL LOW (ref 30–100)

## 2021-01-25 LAB — LACTATE DEHYDROGENASE: LDH: 143 U/L (ref 98–192)

## 2021-01-26 ENCOUNTER — Telehealth: Payer: Self-pay

## 2021-01-26 NOTE — Telephone Encounter (Signed)
Pt's Pantoprazole does not need a PA. Sent a fax to the pharmacy [facility]) and contacted the pt via MYchart to advise him of the same.

## 2021-01-29 ENCOUNTER — Telehealth: Payer: Self-pay

## 2021-01-29 DIAGNOSIS — L2489 Irritant contact dermatitis due to other agents: Secondary | ICD-10-CM

## 2021-01-29 MED ORDER — TRIAMCINOLONE ACETONIDE 0.1 % EX CREA
1.0000 | TOPICAL_CREAM | Freq: Two times a day (BID) | CUTANEOUS | 2 refills | Status: AC
Start: 2021-01-29 — End: ?

## 2021-01-29 NOTE — Telephone Encounter (Signed)
  Prescription Request  01/29/2021  What is the name of the medication or equipment? CREAM FOR ITCHING ON LEG   Have you contacted your pharmacy to request a refill? (if applicable) YES  Which pharmacy would you like this sent to? [facility]   Patient notified that their request is being sent to the clinical staff for review and that they should receive a response within 2 business days.

## 2021-01-29 NOTE — Telephone Encounter (Signed)
Sent refill of Kenalog cream to the pharmacy and patient notified
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
## 2021-02-02 ENCOUNTER — Inpatient Hospital Stay (HOSPITAL_COMMUNITY): Payer: Commercial Managed Care - PPO | Admitting: Hematology

## 2021-02-02 ENCOUNTER — Other Ambulatory Visit: Payer: Self-pay

## 2021-02-02 NOTE — Patient Instructions (Addendum)
[facility] at [facility] Discharge Instructions  You were seen today by Dr. Ellin Saba. He went over your recent results. Begin taking vitamin D twice daily. Do not take any vitamin C or iron supplements. Dr. Ellin Saba will see you back in 4 months for labs and follow up.   Thank you for choosing [facility] at [facility] to provide your oncology and hematology care.  To afford each patient quality time with our provider, please arrive at least 15 minutes before your scheduled appointment time.   If you have a lab appointment with the Cancer Center please come in thru the Main Entrance and check in at the main information desk  You need to re-schedule your appointment should you arrive 10 or more minutes late.  We strive to give you quality time with our providers, and arriving late affects you and other patients whose appointments are after yours.  Also, if you no show three or more times for appointments you may be dismissed from the clinic at the providers discretion.     Again, thank you for choosing [facility].  Our hope is that these requests will decrease the amount of time that you wait before being seen by our physicians.       _____________________________________________________________  Should you have questions after your visit to [facility], please contact our office at [phone] between the hours of 8:00 a.m. and 4:30 p.m.  Voicemails left after 4:00 p.m. will not be returned until the following business day.  For prescription refill requests, have your pharmacy contact our office and allow 72 hours.    Cancer Center Support Programs:   > Cancer Support Group  2nd Tuesday of the month 1pm-2pm, Journey Room

## 2021-03-17 ENCOUNTER — Ambulatory Visit: Payer: Commercial Managed Care - PPO | Admitting: Gastroenterology
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
## 2021-05-03 ENCOUNTER — Ambulatory Visit: Payer: Commercial Managed Care - PPO | Admitting: Gastroenterology

## 2021-05-03 ENCOUNTER — Encounter: Payer: Self-pay | Admitting: Gastroenterology

## 2021-05-03 ENCOUNTER — Other Ambulatory Visit: Payer: Self-pay

## 2021-05-03 DIAGNOSIS — R7989 Other specified abnormal findings of blood chemistry: Secondary | ICD-10-CM

## 2021-05-03 DIAGNOSIS — K219 Gastro-esophageal reflux disease without esophagitis: Secondary | ICD-10-CM

## 2021-05-03 DIAGNOSIS — K5900 Constipation, unspecified: Secondary | ICD-10-CM

## 2021-05-03 NOTE — Patient Instructions (Signed)
Please have labs completed at [facility].  Continue Protonix 40 mg daily 30 minutes before breakfast.  Continue Linzess 145 mcg daily 30 minutes before your first meal.  As we discussed, I recommend you avoid alcohol.  If taking Tylenol, do not exceed 2000 mg per 24 hours.  We will plan to see you back in 6 months.  Do not hesitate to call if you have questions or concerns prior to next visit.   It was a pleasure seeing you today!  Ermalinda Memos, PA-C [facility]

## 2021-05-04 ENCOUNTER — Encounter: Payer: Self-pay | Admitting: Gastroenterology

## 2021-05-06 ENCOUNTER — Other Ambulatory Visit (HOSPITAL_COMMUNITY)
Admission: RE | Admit: 2021-05-06 | Discharge: 2021-05-06 | Disposition: A | Discharge status: Home / Self Care | Payer: Commercial Managed Care - PPO | Source: Ambulatory Visit | Attending: Gastroenterology | Admitting: Gastroenterology

## 2021-05-06 DIAGNOSIS — R7989 Other specified abnormal findings of blood chemistry: Secondary | ICD-10-CM | POA: Insufficient documentation

## 2021-05-06 LAB — HEPATIC FUNCTION PANEL
ALT: 32 U/L (ref 0–44)
AST: 27 U/L (ref 15–41)
Albumin: 4.4 g/dL (ref 3.5–5.0)
Alkaline Phosphatase: 181 U/L — ABNORMAL HIGH (ref 38–126)
Bilirubin, Direct: 0.1 mg/dL (ref 0.0–0.2)
Indirect Bilirubin: 0.6 mg/dL (ref 0.3–0.9)
Total Bilirubin: 0.7 mg/dL (ref 0.3–1.2)
Total Protein: 6.8 g/dL (ref 6.5–8.1)

## 2021-05-20 ENCOUNTER — Other Ambulatory Visit: Payer: Self-pay

## 2021-05-20 ENCOUNTER — Encounter: Payer: Self-pay | Admitting: Family Medicine

## 2021-05-20 ENCOUNTER — Ambulatory Visit: Payer: Commercial Managed Care - PPO | Admitting: Family Medicine

## 2021-05-20 VITALS — BP 117/81 | HR 72 | Temp 98.2°F | Ht 71.0 in | Wt 147.4 lb

## 2021-05-20 DIAGNOSIS — G43709 Chronic migraine without aura, not intractable, without status migrainosus: Secondary | ICD-10-CM

## 2021-05-20 DIAGNOSIS — K5900 Constipation, unspecified: Secondary | ICD-10-CM

## 2021-05-20 DIAGNOSIS — E559 Vitamin D deficiency, unspecified: Secondary | ICD-10-CM

## 2021-05-20 DIAGNOSIS — K219 Gastro-esophageal reflux disease without esophagitis: Secondary | ICD-10-CM

## 2021-05-20 DIAGNOSIS — E7800 Pure hypercholesterolemia, unspecified: Secondary | ICD-10-CM

## 2021-05-20 MED ORDER — AMITRIPTYLINE HCL 50 MG PO TABS
50.0000 mg | ORAL_TABLET | Freq: Every day | ORAL | 3 refills | Status: AC
Start: 2021-05-20 — End: ?

## 2021-05-20 MED ORDER — RIZATRIPTAN BENZOATE 10 MG PO TABS: ORAL_TABLET | ORAL | 5 refills | Status: AC

## 2021-05-20 NOTE — Progress Notes (Signed)
Established Patient Office Visit  Subjective:  Patient ID: Jacob Eaton, male    DOB: Sep 08, 1981  Age: 40 y.o. MRN: [id number]  CC:  Chief Complaint  Patient presents with   Medical Management of Chronic Issues    HPI [name] presents for chronic follow up. He reports doing well and has no concerns today.   He recently saw his GI. He reports his reflux is well controlled with protonix and constipation is well controlled with linzess. He will see hematology in a few weeks. He last had labs drawn in May by his hematologist. Labs were stable at that point. He has been taking amitriptyline for migraines. He reports that his has controlled his migraines well. His last migraine was about 2 months ago. He takes maxalt prn with good relief.   Past Medical History:  Diagnosis Date   Chronic tension headaches    Constipation    Depression    GERD (gastroesophageal reflux disease)    Hemochromatosis    Hemorrhoids    Documented on colonoscopy in January 2020.    Past Surgical History:  Procedure Laterality Date   BIOPSY  10/08/2018   Procedure: BIOPSY;  Surgeon: Danie Binder, MD;  [facility];  Service: Endoscopy;;  sigmoid and rectal bx's   COLONOSCOPY N/A 10/08/2018   Dr. Oneida Alar: external and internal hemorrhoids, anal fissure.  Repeat at age 67.   ESOPHAGEAL ATRESIA REPAIR  1990    Family History  Problem Relation Age of Onset   Seizures Mother    Heart disease Father    Cancer Father    Asthma Sister    Colon cancer Neg Hx    Colon polyps Neg Hx     Social History   Socioeconomic History   Marital status: Single    Spouse name: Not on file   Number of children: Not on file   Years of education: Not on file   Highest education level: Not on file  Occupational History   Not on file  Tobacco Use   Smoking status: Former    Types: Cigarettes    Start date: 09/20/1999    Quit date: 09/19/2000    Years since quitting: 20.6   Smokeless tobacco: Never   Vaping Use   Vaping Use: Never used  Substance and Sexual Activity   Alcohol use: Yes    Comment: 1 mixed drink per week.   Drug use: No   Sexual activity: Not on file  Other Topics Concern   Not on file  Social History Narrative   Not on file   Social Determinants of Health   Financial Resource Strain: Not on file  Food Insecurity: Not on file  Transportation Needs: Not on file  Physical Activity: Not on file  Stress: Not on file  Social Connections: Not on file  Intimate Partner Violence: Not on file    Outpatient Medications Prior to Visit  Medication Sig Dispense Refill   amitriptyline (ELAVIL) 50 MG tablet Take 1 tablet (50 mg total) by mouth at bedtime. 30 tablet 0   Cholecalciferol (VITAMIN D3) 25 MCG (1000 UT) CAPS Take 2 capsules by mouth.     linaclotide (LINZESS) 145 MCG CAPS capsule Take 1 capsule (145 mcg total) by mouth daily before breakfast. 90 capsule 1   naproxen sodium (ALEVE) 220 MG tablet Take 220 mg by mouth daily as needed.     OVER THE COUNTER MEDICATION every other day. Prune tablets takes 2 tablets daily  pantoprazole (PROTONIX) 40 MG tablet Take 1 tablet (40 mg total) by mouth daily before breakfast. 90 tablet 3   rizatriptan (MAXALT) 10 MG tablet TAKE 1 TABLET BY MOUTH AS NEEDED FOR migraine (MAY REPEAT in TWO hours if needed) 10 tablet 0   triamcinolone cream (KENALOG) 0.1 % Apply 1 application topically 2 (two) times daily. 80 g 2   No facility-administered medications prior to visit.    Allergies  Allergen Reactions   Naproxen Hives   Permethrin Hives    ROS Review of Systems  Constitutional:  Negative for appetite change, fever and unexpected weight change.  HENT:  Negative for trouble swallowing.   Respiratory:  Negative for shortness of breath.   Cardiovascular:  Negative for chest pain, palpitations and leg swelling.  Gastrointestinal:  Negative for abdominal pain, constipation, diarrhea, nausea and vomiting.  Neurological:   Negative for dizziness, facial asymmetry, speech difficulty, weakness, light-headedness and numbness.  Psychiatric/Behavioral:  Negative for confusion, dysphoric mood, sleep disturbance and suicidal ideas. The patient is not nervous/anxious.      Objective:    Physical Exam Vitals and nursing note reviewed.  Constitutional:      General: He is not in acute distress.    Appearance: He is not ill-appearing, toxic-appearing or diaphoretic.  Cardiovascular:     Rate and Rhythm: Normal rate and regular rhythm.     Heart sounds: Normal heart sounds. No murmur heard. Pulmonary:     Effort: Pulmonary effort is normal. No respiratory distress.     Breath sounds: Normal breath sounds.  Abdominal:     General: Bowel sounds are normal. There is no distension.     Palpations: Abdomen is soft.     Tenderness: There is no abdominal tenderness. There is no guarding or rebound.  Musculoskeletal:     Right lower leg: No edema.     Left lower leg: No edema.  Skin:    General: Skin is warm and dry.  Neurological:     General: No focal deficit present.     Mental Status: He is alert and oriented to person, place, and time.  Psychiatric:        Mood and Affect: Mood normal.        Behavior: Behavior normal.    BP 117/81   Pulse 72   Temp 98.2 F (36.8 C) (Temporal)   Ht _0  (1.803 m)   Wt 147 lb 6 oz (66.8 kg)   BMI 20.55 kg/m  Wt Readings from Last 3 Encounters:  05/20/21 147 lb 6 oz (66.8 kg)  05/03/21 151 lb 3.2 oz (68.6 kg)  02/02/21 155 lb 3.2 oz (70.4 kg)     There are no preventive care reminders to display for this patient.  There are no preventive care reminders to display for this patient.  Lab Results  Component Value Date   TSH 0.944 11/19/2020   Lab Results  Component Value Date   WBC 4.0 01/25/2021   HGB 15.5 01/25/2021   HCT 46.8 01/25/2021   MCV 97.5 01/25/2021   PLT 200 01/25/2021   Lab Results  Component Value Date   NA 136 01/25/2021   K 3.7  01/25/2021   CO2 28 01/25/2021   GLUCOSE 98 01/25/2021   BUN 10 01/25/2021   CREATININE 0.86 01/25/2021   BILITOT 0.7 05/06/2021   ALKPHOS 181 (H) 05/06/2021   AST 27 05/06/2021   ALT 32 05/06/2021   PROT 6.8 05/06/2021   ALBUMIN 4.4 05/06/2021  CALCIUM 9.2 01/25/2021   ANIONGAP 7 01/25/2021   EGFR 109 11/19/2020   Lab Results  Component Value Date   CHOL 274 (H) 11/19/2020   Lab Results  Component Value Date   HDL 60 11/19/2020   Lab Results  Component Value Date   LDLCALC 200 (H) 11/19/2020   Lab Results  Component Value Date   TRIG 83 11/19/2020   Lab Results  Component Value Date   CHOLHDL 4.6 11/19/2020   No results found for: HGBA1C    Assessment & Plan:   Macguire was seen today for medical management of chronic issues.  Diagnoses and all orders for this visit:  Chronic migraine without aura without status migrainosus, not intractable Well controlled on current regimen.  -     amitriptyline (ELAVIL) 50 MG tablet; Take 1 tablet (50 mg total) by mouth at bedtime. -     rizatriptan (MAXALT) 10 MG tablet; TAKE 1 TABLET BY MOUTH AS NEEDED FOR migraine (MAY REPEAT in TWO hours if needed)  Elevated LDL cholesterol level Diet and exercise. Labs pending. Ordered future for when he has labs done with his hematology follow up in a few weeks.  -     Lipid panel; Future  Vitamin D deficiency On oral supplement.  Hereditary hemochromatosis ([place]) Managed by hematology.   Gastroesophageal reflux disease without esophagitis Well controlled on current regimen. Managed by GI.   Constipation, unspecified constipation type Well controlled on current regimen. Managed by GI.   Follow-up: Return in about 1 year (around 05/20/2022) for CPE.   The patient indicates understanding of these issues and agrees with the plan.  Gwenlyn Perking, FNP

## 2021-05-20 NOTE — Patient Instructions (Signed)
Migraine Headache A migraine headache is an intense, throbbing pain on one side or both sides of the head. Migraine headaches may also cause other symptoms, such as nausea, vomiting, and sensitivity to light and noise. A migraine headache can last from 4 hours to 3 days. Talk with your doctor about what things may bring on (trigger) your migraine headaches. What are the causes? The exact cause of this condition is not known. However, a migraine may be caused when nerves in the brain become irritated and release chemicals that cause inflammation of blood vessels. This inflammation causes pain. This condition may be triggered or caused by: Drinking alcohol. Smoking. Taking medicines, such as: Medicine used to treat chest pain (nitroglycerin). Birth control pills. Estrogen. Certain blood pressure medicines. Eating or drinking products that contain nitrates, glutamate, aspartame, or tyramine. Aged cheeses, chocolate, or caffeine may also be triggers. Doing physical activity. Other things that may trigger a migraine headache include: Menstruation. Pregnancy. Hunger. Stress. Lack of sleep or too much sleep. Weather changes. Fatigue. What increases the risk? The following factors may make you more likely to experience migraine headaches: Being a certain age. This condition is more common in people who are 25-55 years old. Being male. Having a family history of migraine headaches. Being Caucasian. Having a mental health condition, such as depression or anxiety. Being obese. What are the signs or symptoms? The main symptom of this condition is pulsating or throbbing pain. This pain may: Happen in any area of the head, such as on one side or both sides. Interfere with daily activities. Get worse with physical activity. Get worse with exposure to bright lights or loud noises. Other symptoms may include: Nausea. Vomiting. Dizziness. General sensitivity to bright lights, loud noises, or  smells. Before you get a migraine headache, you may get warning signs (an aura). An aura may include: Seeing flashing lights or having blind spots. Seeing bright spots, halos, or zigzag lines. Having tunnel vision or blurred vision. Having numbness or a tingling feeling. Having trouble talking. Having muscle weakness. Some people have symptoms after a migraine headache (postdromal phase), such as: Feeling tired. Difficulty concentrating. How is this diagnosed? A migraine headache can be diagnosed based on: Your symptoms. A physical exam. Tests, such as: CT scan or an MRI of the head. These imaging tests can help rule out other causes of headaches. Taking fluid from the spine (lumbar puncture) and analyzing it (cerebrospinal fluid analysis, or CSF analysis). How is this treated? This condition may be treated with medicines that: Relieve pain. Relieve nausea. Prevent migraine headaches. Treatment for this condition may also include: Acupuncture. Lifestyle changes like avoiding foods that trigger migraine headaches. Biofeedback. Cognitive behavioral therapy. Follow these instructions at home: Medicines Take over-the-counter and prescription medicines only as told by your health care provider. Ask your health care provider if the medicine prescribed to you: Requires you to avoid driving or using heavy machinery. Can cause constipation. You may need to take these actions to prevent or treat constipation: Drink enough fluid to keep your urine pale yellow. Take over-the-counter or prescription medicines. Eat foods that are high in fiber, such as beans, whole grains, and fresh fruits and vegetables. Limit foods that are high in fat and processed sugars, such as fried or sweet foods. Lifestyle Do not drink alcohol. Do not use any products that contain nicotine or tobacco, such as cigarettes, e-cigarettes, and chewing tobacco. If you need help quitting, ask your health care  provider. Get at least 8   hours of sleep every night. Find ways to manage stress, such as meditation, deep breathing, or yoga. General instructions   Keep a journal to find out what may trigger your migraine headaches. For example, write down: What you eat and drink. How much sleep you get. Any change to your diet or medicines. If you have a migraine headache: Avoid things that make your symptoms worse, such as bright lights. It may help to lie down in a dark, quiet room. Do not drive or use heavy machinery. Ask your health care provider what activities are safe for you while you are experiencing symptoms. Keep all follow-up visits as told by your health care provider. This is important. Contact a health care provider if: You develop symptoms that are different or more severe than your usual migraine headache symptoms. You have more than 15 headache days in one month. Get help right away if: Your migraine headache becomes severe. Your migraine headache lasts longer than 72 hours. You have a fever. You have a stiff neck. You have vision loss. Your muscles feel weak or like you cannot control them. You start to lose your balance often. You have trouble walking. You faint. You have a seizure. Summary A migraine headache is an intense, throbbing pain on one side or both sides of the head. Migraines may also cause other symptoms, such as nausea, vomiting, and sensitivity to light and noise. This condition may be treated with medicines and lifestyle changes. You may also need to avoid certain things that trigger a migraine headache. Keep a journal to find out what may trigger your migraine headaches. Contact your health care provider if you have more than 15 headache days in a month or you develop symptoms that are different or more severe than your usual migraine headache symptoms. This information is not intended to replace advice given to you by your health care provider. Make sure you  discuss any questions you have with your health care provider. Document Revised: 12/28/2018 Document Reviewed: 10/18/2018 Elsevier Patient Education  2022 Elsevier Inc.  

## 2021-06-10 ENCOUNTER — Other Ambulatory Visit: Payer: Self-pay

## 2021-06-10 ENCOUNTER — Inpatient Hospital Stay (HOSPITAL_COMMUNITY): Payer: Commercial Managed Care - PPO | Attending: Hematology

## 2021-06-10 DIAGNOSIS — R519 Headache, unspecified: Secondary | ICD-10-CM | POA: Diagnosis not present

## 2021-06-10 DIAGNOSIS — K219 Gastro-esophageal reflux disease without esophagitis: Secondary | ICD-10-CM | POA: Insufficient documentation

## 2021-06-10 DIAGNOSIS — Z8719 Personal history of other diseases of the digestive system: Secondary | ICD-10-CM | POA: Diagnosis not present

## 2021-06-10 DIAGNOSIS — Z836 Family history of other diseases of the respiratory system: Secondary | ICD-10-CM | POA: Diagnosis not present

## 2021-06-10 DIAGNOSIS — Z886 Allergy status to analgesic agent status: Secondary | ICD-10-CM | POA: Insufficient documentation

## 2021-06-10 DIAGNOSIS — Z820 Family history of epilepsy and other diseases of the nervous system: Secondary | ICD-10-CM | POA: Insufficient documentation

## 2021-06-10 DIAGNOSIS — Z79899 Other long term (current) drug therapy: Secondary | ICD-10-CM | POA: Insufficient documentation

## 2021-06-10 DIAGNOSIS — M2550 Pain in unspecified joint: Secondary | ICD-10-CM | POA: Insufficient documentation

## 2021-06-10 DIAGNOSIS — Z8249 Family history of ischemic heart disease and other diseases of the circulatory system: Secondary | ICD-10-CM | POA: Diagnosis not present

## 2021-06-10 DIAGNOSIS — Z809 Family history of malignant neoplasm, unspecified: Secondary | ICD-10-CM | POA: Insufficient documentation

## 2021-06-10 DIAGNOSIS — Z87891 Personal history of nicotine dependence: Secondary | ICD-10-CM | POA: Diagnosis not present

## 2021-06-10 LAB — CBC WITH DIFFERENTIAL/PLATELET
Abs Immature Granulocytes: 0.02 10*3/uL (ref 0.00–0.07)
Basophils Absolute: 0 10*3/uL (ref 0.0–0.1)
Basophils Relative: 1 %
Eosinophils Absolute: 0.2 10*3/uL (ref 0.0–0.5)
Eosinophils Relative: 4 %
HCT: 46.2 % (ref 39.0–52.0)
Hemoglobin: 15.8 g/dL (ref 13.0–17.0)
Immature Granulocytes: 1 %
Lymphocytes Relative: 16 %
Lymphs Abs: 0.7 10*3/uL (ref 0.7–4.0)
MCH: 33.5 pg (ref 26.0–34.0)
MCHC: 34.2 g/dL (ref 30.0–36.0)
MCV: 98.1 fL (ref 80.0–100.0)
Monocytes Absolute: 0.5 10*3/uL (ref 0.1–1.0)
Monocytes Relative: 12 %
Neutro Abs: 2.9 10*3/uL (ref 1.7–7.7)
Neutrophils Relative %: 66 %
Platelets: 206 10*3/uL (ref 150–400)
RBC: 4.71 MIL/uL (ref 4.22–5.81)
RDW: 13.4 % (ref 11.5–15.5)
WBC: 4.3 10*3/uL (ref 4.0–10.5)
nRBC: 0 % (ref 0.0–0.2)

## 2021-06-10 LAB — IRON AND TIBC
Iron: 118 ug/dL (ref 45–182)
Saturation Ratios: 37 % (ref 17.9–39.5)
TIBC: 321 ug/dL (ref 250–450)
UIBC: 203 ug/dL

## 2021-06-10 LAB — VITAMIN D 25 HYDROXY (VIT D DEFICIENCY, FRACTURES): Vit D, 25-Hydroxy: 32.89 ng/mL (ref 30–100)

## 2021-06-10 LAB — FERRITIN: Ferritin: 18 ng/mL — ABNORMAL LOW (ref 24–336)

## 2021-06-16 NOTE — Progress Notes (Signed)
[address] [place], [place] [address]   CLINIC:  Medical Oncology/Hematology  PCP:  Gwenlyn Perking, [place] / [place] [address]  [phone]  REASON FOR VISIT:  Follow-up for hemochromatosis  PRIOR THERAPY: none  CURRENT THERAPY: intermittent phlebotomies  INTERVAL HISTORY:  [name], a 40 y.o. male, returns for routine follow-up for his hemochromatosis. Jacob Eaton was last seen on 02/02/2021.  Today he reports feeling good.   REVIEW OF SYSTEMS:  Review of Systems  Constitutional:  Negative for appetite change and fatigue.  Musculoskeletal:  Positive for arthralgias (8/10).  Neurological:  Positive for headaches.  All other systems reviewed and are negative.  PAST MEDICAL/SURGICAL HISTORY:  Past Medical History:  Diagnosis Date   Chronic tension headaches    Constipation    Depression    GERD (gastroesophageal reflux disease)    Hemochromatosis    Hemorrhoids    Documented on colonoscopy in January 2020.   Past Surgical History:  Procedure Laterality Date   BIOPSY  10/08/2018   Procedure: BIOPSY;  Surgeon: Danie Binder, MD;  [facility];  Service: Endoscopy;;  sigmoid and rectal bx's   COLONOSCOPY N/A 10/08/2018   Dr. Oneida Alar: external and internal hemorrhoids, anal fissure.  Repeat at age 63.   ESOPHAGEAL ATRESIA REPAIR  1990    SOCIAL HISTORY:  Social History   Socioeconomic History   Marital status: Single    Spouse name: Not on file   Number of children: Not on file   Years of education: Not on file   Highest education level: Not on file  Occupational History   Not on file  Tobacco Use   Smoking status: Former    Types: Cigarettes    Start date: 09/20/1999    Quit date: 09/19/2000    Years since quitting: 20.7   Smokeless tobacco: Never  Vaping Use   Vaping Use: Never used  Substance and Sexual Activity   Alcohol use: Yes    Comment: 1 mixed drink per week.   Drug use: No    Sexual activity: Not on file  Other Topics Concern   Not on file  Social History Narrative   Not on file   Social Determinants of Health   Financial Resource Strain: Not on file  Food Insecurity: Not on file  Transportation Needs: Not on file  Physical Activity: Not on file  Stress: Not on file  Social Connections: Not on file  Intimate Partner Violence: Not on file    FAMILY HISTORY:  Family History  Problem Relation Age of Onset   Seizures Mother    Heart disease Father    Cancer Father    Asthma Sister    Colon cancer Neg Hx    Colon polyps Neg Hx     CURRENT MEDICATIONS:  Current Outpatient Medications  Medication Sig Dispense Refill   amitriptyline (ELAVIL) 50 MG tablet Take 1 tablet (50 mg total) by mouth at bedtime. 90 tablet 3   Cholecalciferol (VITAMIN D3) 25 MCG (1000 UT) CAPS Take 2 capsules by mouth.     linaclotide (LINZESS) 145 MCG CAPS capsule Take 1 capsule (145 mcg total) by mouth daily before breakfast. 90 capsule 1   naproxen sodium (ALEVE) 220 MG tablet Take 220 mg by mouth daily as needed.     OVER THE COUNTER MEDICATION every other day. Prune tablets takes 2 tablets daily     pantoprazole (PROTONIX) 40 MG tablet  Take 1 tablet (40 mg total) by mouth daily before breakfast. 90 tablet 3   rizatriptan (MAXALT) 10 MG tablet TAKE 1 TABLET BY MOUTH AS NEEDED FOR migraine (MAY REPEAT in TWO hours if needed) 10 tablet 5   triamcinolone cream (KENALOG) 0.1 % Apply 1 application topically 2 (two) times daily. 80 g 2   No current facility-administered medications for this visit.    ALLERGIES:  Allergies  Allergen Reactions   Naproxen Hives   Permethrin Hives    PHYSICAL EXAM:  Performance status (ECOG): 1 - Symptomatic but completely ambulatory  There were no vitals filed for this visit. Wt Readings from Last 3 Encounters:  05/20/21 147 lb 6 oz (66.8 kg)  05/03/21 151 lb 3.2 oz (68.6 kg)  02/02/21 155 lb 3.2 oz (70.4 kg)   Physical Exam Vitals  reviewed.  Constitutional:      Appearance: Normal appearance.  Cardiovascular:     Rate and Rhythm: Normal rate and regular rhythm.     Pulses: Normal pulses.     Heart sounds: Normal heart sounds.  Pulmonary:     Effort: Pulmonary effort is normal.     Breath sounds: Normal breath sounds.  Abdominal:     Palpations: Abdomen is soft. There is no hepatomegaly, splenomegaly or mass.     Tenderness: There is no abdominal tenderness.  Neurological:     General: No focal deficit present.     Mental Status: He is alert and oriented to person, place, and time.  Psychiatric:        Mood and Affect: Mood normal.        Behavior: Behavior normal.    LABORATORY DATA:  I have reviewed the labs as listed.  CBC Latest Ref Rng & Units 06/10/2021 01/25/2021 11/19/2020  WBC 4.0 - 10.5 K/uL 4.3 4.0 4.9  Hemoglobin 13.0 - 17.0 g/dL 15.8 15.5 14.8  Hematocrit 39.0 - 52.0 % 46.2 46.8 43.8  Platelets 150 - 400 K/uL 206 200 205   CMP Latest Ref Rng & Units 05/06/2021 01/25/2021 12/16/2020  Glucose 70 - 99 mg/dL - 98 -  BUN 6 - 20 mg/dL - 10 -  Creatinine 0.61 - 1.24 mg/dL - 0.86 -  Sodium 135 - 145 mmol/L - 136 -  Potassium 3.5 - 5.1 mmol/L - 3.7 -  Chloride 98 - 111 mmol/L - 101 -  CO2 22 - 32 mmol/L - 28 -  Calcium 8.9 - 10.3 mg/dL - 9.2 -  Total Protein 6.5 - 8.1 g/dL 6.8 7.5 7.2  Total Bilirubin 0.3 - 1.2 mg/dL 0.7 0.7 0.6  Alkaline Phos 38 - 126 U/L 181(H) 253(H) 252(H)  AST 15 - 41 U/L 27 58(H) 30  ALT 0 - 44 U/L 32 61(H) 53(H)      Component Value Date/Time   RBC 4.71 06/10/2021 1038   MCV 98.1 06/10/2021 1038   MCV 94 11/19/2020 1106   MCH 33.5 06/10/2021 1038   MCHC 34.2 06/10/2021 1038   RDW 13.4 06/10/2021 1038   RDW 11.9 11/19/2020 1106   LYMPHSABS 0.7 06/10/2021 1038   LYMPHSABS 0.8 11/19/2020 1106   MONOABS 0.5 06/10/2021 1038   EOSABS 0.2 06/10/2021 1038   EOSABS 0.1 11/19/2020 1106   BASOSABS 0.0 06/10/2021 1038   BASOSABS 0.0 11/19/2020 1106    DIAGNOSTIC IMAGING:  I  have independently reviewed the scans and discussed with the patient. No results found.   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 - He had a liver biopsy done on 01/08/2018 which showed well-preserved architecture, hepatic lobules with no significant steatosis, cholestasis or inflammation.  No pathological fibrosis.  Iron stain shows mostly mild to focal moderate granular iron disposition with hepatocytes (8-6+) of uncertain significance. -Not a regular drinker.  No family history of hemochromatosis on the mother side.  Does not know the family history on the father's side.  Hepatitis panel was negative.  Normal ceruloplasmin.-  He has been counseled about well-balanced diet and avoid iron and vitamin C supplements. -He reports he has been getting phlebotomies every 8 weeks at [facility] in [place].   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 - No fevers, night sweats or weight loss.  No infections reported. - Reviewed labs from 06/11/2021.  Ferritin is 18 and percent saturation is 37.  CBC shows normal hemoglobin of 15.8 and hematocrit 46. - LFTs from 05/06/2021 shows mildly elevated alk phos which is improved from previous values.  Rest of the LFTs are normal. - RTC 6 months with repeat labs.   2.  Vitamin D deficiency: - Continue vitamin D 2 tablets daily. - Vitamin D level improved to 32.  Orders placed this encounter:  No orders of the defined types were placed in this encounter.    Derek Jack, MD [place] [phone]   I, Thana Ates, am acting as a scribe for Dr. Derek Jack.  I, Derek Jack MD, have reviewed the above documentation for accuracy and completeness, and I agree with the above.

## 2021-06-17 ENCOUNTER — Other Ambulatory Visit: Payer: Self-pay

## 2021-06-17 ENCOUNTER — Inpatient Hospital Stay (HOSPITAL_BASED_OUTPATIENT_CLINIC_OR_DEPARTMENT_OTHER): Payer: Commercial Managed Care - PPO | Admitting: Hematology

## 2021-06-17 NOTE — Patient Instructions (Addendum)
[facility] at [facility] Discharge Instructions  You were seen today by Dr. Katragadda. He went over your recent results. Dr. Katragadda will see you back in 6 months for labs and follow up.   Thank you for choosing  Cancer Center at [facility] to provide your oncology and hematology care.  To afford each patient quality time with our provider, please arrive at least 15 minutes before your scheduled appointment time.   If you have a lab appointment with the Cancer Center please come in thru the Main Entrance and check in at the main information desk  You need to re-schedule your appointment should you arrive 10 or more minutes late.  We strive to give you quality time with our providers, and arriving late affects you and other patients whose appointments are after yours.  Also, if you no show three or more times for appointments you may be dismissed from the clinic at the providers discretion.     Again, thank you for choosing [facility].  Our hope is that these requests will decrease the amount of time that you wait before being seen by our physicians.       _____________________________________________________________  Should you have questions after your visit to [facility], please contact our office at [phone] between the hours of 8:00 a.m. and 4:30 p.m.  Voicemails left after 4:00 p.m. will not be returned until the following business day.  For prescription refill requests, have your pharmacy contact our office and allow 72 hours.    Cancer Center Support Programs:   > Cancer Support Group  2nd Tuesday of the month 1pm-2pm, Journey Room    

## 2021-06-25 ENCOUNTER — Other Ambulatory Visit: Payer: Self-pay | Admitting: Gastroenterology

## 2021-06-25 DIAGNOSIS — K5904 Chronic idiopathic constipation: Secondary | ICD-10-CM

## 2021-08-31 ENCOUNTER — Other Ambulatory Visit: Payer: Self-pay | Admitting: Nurse Practitioner

## 2021-08-31 DIAGNOSIS — K219 Gastro-esophageal reflux disease without esophagitis: Secondary | ICD-10-CM

## 2021-09-02 ENCOUNTER — Encounter: Payer: Self-pay | Admitting: Gastroenterology

## 2021-09-22 ENCOUNTER — Encounter (HOSPITAL_COMMUNITY)
Admission: RE | Admit: 2021-09-22 | Discharge: 2021-09-22 | Disposition: A | Discharge status: Home / Self Care | Payer: Commercial Managed Care - PPO | Source: Ambulatory Visit | Attending: Ophthalmology | Admitting: Ophthalmology

## 2021-09-23 NOTE — H&P (Signed)
Surgical History & Physical  Patient Name: Jacob Eaton DOB: [birth year]  Surgery: Cataract extraction with intraocular lens implant phacoemulsification; Right Eye  Surgeon: Baruch Goldmann MD Surgery Date:  09-24-21 Pre-Op Date:  09-16-21  HPI: A 8 Yr. old male patient 1. 1. The patient complains of difficulty when viewing TV, reading closed caption, news scrolls on TV, which began many years ago. The right eye is affected. Pt states he can con see out of his right eye.The episode is gradual. The condition's severity increased since last visit. Symptoms occur when the patient is driving, inside, outside and reading. The complaint is associated with glare. This is negatively affecting the patient's quality of life and the patient is unable to function adequately in life with the current level of vision. is referred by Dr Wynetta Emery for cataract eval. HPI was performed by Baruch Goldmann .  Medical History: liver issues (fatty liver)  Review of Systems Negative Allergic/Immunologic Negative Cardiovascular Negative Constitutional Negative Ear, Nose, Mouth & Throat Negative Endocrine Negative Eyes Negative Gastrointestinal Negative Genitourinary Negative Hemotologic/Lymphatic Negative Integumentary Negative Musculoskeletal Negative Neurological Negative Psychiatry Negative Respiratory  Social   Former smoker   Medication  None  Sx/Procedures Throat sx as a child,   Drug Allergies  Anti-inflammatories,   History & Physical: Heent: Cataract, Right Eye NECK: supple without bruits LUNGS: lungs clear to auscultation CV: regular rate and rhythm Abdomen: soft and non-tender  Impression & Plan: Assessment: 1.  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  BLEPHARITIS; Right Upper Lid, Right Lower Lid, Left Upper Lid, Left Lower Lid (H01.001, H01.002,H01.004,H01.005) 3.  COMBINED FORMS AGE RELATED CATARACT; Left Eye (H25.812)  Plan: 1.  Cataract  accounts for the patient's decreased vision. This visual impairment is not correctable with a tolerable change in glasses or contact lenses. Cataract surgery with an implantation of a new lens should significantly improve the visual and functional status of the patient. Discussed all risks, benefits, alternatives, and potential complications. Discussed the procedures and recovery. Patient desires to have surgery. A-scan ordered and performed today for intra-ocular lens calculations. The surgery will be performed in order to improve vision for driving, reading, and for eye examinations. Recommend phacoemulsification with intra-ocular lens. Recommend Dextenza for post-operative pain and inflammation. Right Eye. Dilates well - shugarcaine by protocol. [facility].  2.  Recommend regular lid cleaning.  3.  Cataract not visually significant - continue to monitor.

## 2021-09-24 ENCOUNTER — Ambulatory Visit (HOSPITAL_COMMUNITY)
Admission: RE | Admit: 2021-09-24 | Discharge: 2021-09-24 | Disposition: A | Discharge status: Home / Self Care | Payer: Commercial Managed Care - PPO | Attending: Ophthalmology | Admitting: Ophthalmology

## 2021-09-24 ENCOUNTER — Encounter (HOSPITAL_COMMUNITY): Payer: Self-pay | Admitting: Ophthalmology

## 2021-09-24 ENCOUNTER — Ambulatory Visit (HOSPITAL_COMMUNITY): Payer: Commercial Managed Care - PPO | Admitting: Certified Registered"

## 2021-09-24 ENCOUNTER — Encounter (HOSPITAL_COMMUNITY)
Admission: RE | Disposition: A | Discharge status: Home / Self Care | Payer: Self-pay | Source: Home / Self Care | Attending: Ophthalmology

## 2021-09-24 DIAGNOSIS — H0100B Unspecified blepharitis left eye, upper and lower eyelids: Secondary | ICD-10-CM | POA: Insufficient documentation

## 2021-09-24 DIAGNOSIS — H2521 Age-related cataract, morgagnian type, right eye: Secondary | ICD-10-CM | POA: Diagnosis not present

## 2021-09-24 DIAGNOSIS — Z87891 Personal history of nicotine dependence: Secondary | ICD-10-CM | POA: Diagnosis not present

## 2021-09-24 DIAGNOSIS — H0100A Unspecified blepharitis right eye, upper and lower eyelids: Secondary | ICD-10-CM | POA: Diagnosis not present

## 2021-09-24 DIAGNOSIS — H25812 Combined forms of age-related cataract, left eye: Secondary | ICD-10-CM | POA: Insufficient documentation

## 2021-09-24 HISTORY — PX: CATARACT EXTRACTION W/PHACO: SHX586

## 2021-09-24 SURGERY — PHACOEMULSIFICATION, CATARACT, WITH IOL INSERTION
Anesthesia: Monitor Anesthesia Care | Site: Eye | Laterality: Right

## 2021-09-24 MED ORDER — SODIUM CHLORIDE 0.9% FLUSH
INTRAVENOUS | Status: DC | PRN
  Administered 2021-09-24: 5 mL via INTRAVENOUS

## 2021-09-24 MED ORDER — LIDOCAINE HCL 3.5 % OP GEL
1.0000 "application " | Freq: Once | OPHTHALMIC | Status: AC
  Administered 2021-09-24: 1 via OPHTHALMIC

## 2021-09-24 MED ORDER — TETRACAINE HCL 0.5 % OP SOLN
1.0000 [drp] | OPHTHALMIC | Status: AC | PRN
  Administered 2021-09-24 (×3): 1 [drp] via OPHTHALMIC

## 2021-09-24 MED ORDER — POVIDONE-IODINE 5 % OP SOLN
OPHTHALMIC | Status: DC | PRN
  Administered 2021-09-24: 1 via OPHTHALMIC

## 2021-09-24 MED ORDER — STERILE WATER FOR IRRIGATION IR SOLN
Status: DC | PRN
Start: 2021-09-24 — End: 2021-09-24
  Administered 2021-09-24: 250 mL

## 2021-09-24 MED ORDER — MIDAZOLAM HCL 2 MG/2ML IJ SOLN
INTRAMUSCULAR | Status: DC | PRN
  Administered 2021-09-24: 2 mg via INTRAVENOUS

## 2021-09-24 MED ORDER — SODIUM HYALURONATE 10 MG/ML IO SOLUTION
PREFILLED_SYRINGE | INTRAOCULAR | Status: DC | PRN
  Administered 2021-09-24: 0.85 mL via INTRAOCULAR

## 2021-09-24 MED ORDER — TROPICAMIDE 1 % OP SOLN
1.0000 [drp] | OPHTHALMIC | Status: AC | PRN
  Administered 2021-09-24 (×3): 1 [drp] via OPHTHALMIC
  Filled 2021-09-24: qty 2

## 2021-09-24 MED ORDER — TRYPAN BLUE 0.06 % IO SOSY
PREFILLED_SYRINGE | INTRAOCULAR | Status: AC
  Filled 2021-09-24: qty 0.5

## 2021-09-24 MED ORDER — NEOMYCIN-POLYMYXIN-DEXAMETH 3.5-10000-0.1 OP SUSP
OPHTHALMIC | Status: DC | PRN
  Administered 2021-09-24: 1 [drp] via OPHTHALMIC

## 2021-09-24 MED ORDER — SODIUM HYALURONATE 23MG/ML IO SOSY
PREFILLED_SYRINGE | INTRAOCULAR | Status: DC | PRN
  Administered 2021-09-24: 0.6 mL via INTRAOCULAR

## 2021-09-24 MED ORDER — TRYPAN BLUE 0.06 % IO SOSY
PREFILLED_SYRINGE | INTRAOCULAR | Status: DC | PRN
  Administered 2021-09-24: 0.5 mL via INTRAOCULAR

## 2021-09-24 MED ORDER — EPINEPHRINE PF 1 MG/ML IJ SOLN
INTRAOCULAR | Status: DC | PRN
  Administered 2021-09-24: 500 mL

## 2021-09-24 MED ORDER — MIDAZOLAM HCL 2 MG/2ML IJ SOLN
INTRAMUSCULAR | Status: AC
  Filled 2021-09-24: qty 2

## 2021-09-24 MED ORDER — PHENYLEPHRINE HCL 2.5 % OP SOLN
1.0000 [drp] | OPHTHALMIC | Status: AC | PRN
  Administered 2021-09-24 (×3): 1 [drp] via OPHTHALMIC

## 2021-09-24 MED ORDER — EPINEPHRINE PF 1 MG/ML IJ SOLN
INTRAMUSCULAR | Status: AC
  Filled 2021-09-24: qty 2

## 2021-09-24 MED ORDER — LIDOCAINE HCL (PF) 1 % IJ SOLN
INTRAOCULAR | Status: DC | PRN
  Administered 2021-09-24: 1 mL via OPHTHALMIC

## 2021-09-24 MED ORDER — BSS IO SOLN
INTRAOCULAR | Status: DC | PRN
  Administered 2021-09-24: 15 mL via INTRAOCULAR

## 2021-09-24 SURGICAL SUPPLY — 14 items
CATARACT SUITE SIGHTPATH (MISCELLANEOUS) ×2 IMPLANT
CLOTH BEACON ORANGE TIMEOUT ST (SAFETY) ×1 IMPLANT
EYE SHIELD UNIVERSAL CLEAR (GAUZE/BANDAGES/DRESSINGS) ×1 IMPLANT
FEE CATARACT SUITE SIGHTPATH (MISCELLANEOUS) IMPLANT
GLOVE SURG UNDER POLY LF SZ7 (GLOVE) ×2 IMPLANT
LENS IOL RAYNER 20.0 (Intraocular Lens) ×2 IMPLANT
LENS IOL RAYONE EMV 20.0 (Intraocular Lens) IMPLANT
NDL HYPO 18GX1.5 BLUNT FILL (NEEDLE) IMPLANT
NEEDLE HYPO 18GX1.5 BLUNT FILL (NEEDLE) ×2 IMPLANT
PAD ARMBOARD 7.5X6 YLW CONV (MISCELLANEOUS) ×1 IMPLANT
SYR TB 1ML LL NO SAFETY (SYRINGE) ×1 IMPLANT
TAPE SURG TRANSPORE 1 IN (GAUZE/BANDAGES/DRESSINGS) IMPLANT
TAPE SURGICAL TRANSPORE 1 IN (GAUZE/BANDAGES/DRESSINGS) ×2
WATER STERILE IRR 250ML POUR (IV SOLUTION) ×1 IMPLANT

## 2021-09-24 NOTE — Discharge Instructions (Signed)
Please discharge patient when stable, will follow up today with Dr. Julien Oscar at the [facility] [place] office immediately following discharge.  Leave shield in place until visit.  All paperwork with discharge instructions will be given at the office.  [facility]  Address:  [address]  , [place] [address]  

## 2021-09-24 NOTE — Interval H&P Note (Signed)
History and Physical Interval Note:  09/24/2021 9:46 AM  Jacob Eaton  has presented today for surgery, with the diagnosis of cataract hypermature age related; right eye.  The various methods of treatment have been discussed with the patient and family. After consideration of risks, benefits and other options for treatment, the patient has consented to  Procedure(s) with comments: CATARACT EXTRACTION PHACO AND INTRAOCULAR LENS PLACEMENT (IOC) (Right) - right as a surgical intervention.  The patient's history has been reviewed, patient examined, no change in status, stable for surgery.  I have reviewed the patient's chart and labs.  Questions were answered to the patient's satisfaction.     [name]

## 2021-09-24 NOTE — Anesthesia Procedure Notes (Signed)
Procedure Name: MAC Date/Time: 09/24/2021 9:51 AM Performed by: Orlie Dakin, CRNA Pre-anesthesia Checklist: Patient identified, Emergency Drugs available, Patient being monitored and Suction available Patient Re-evaluated:Patient Re-evaluated prior to induction Oxygen Delivery Method: Nasal cannula Placement Confirmation: positive ETCO2

## 2021-09-24 NOTE — Anesthesia Preprocedure Evaluation (Signed)
Anesthesia Evaluation  Patient identified by MRN, date of birth, ID band Patient awake    Reviewed: Allergy & Precautions, H&P , NPO status , Patient's Chart, lab work & pertinent test results, reviewed documented beta blocker date and time   Airway Mallampati: II  TM Distance: >3 FB Neck ROM: full    Dental no notable dental hx.    Pulmonary neg pulmonary ROS, former smoker,    Pulmonary exam normal breath sounds clear to auscultation       Cardiovascular Exercise Tolerance: Good negative cardio ROS   Rhythm:regular Rate:Normal     Neuro/Psych  Headaches, PSYCHIATRIC DISORDERS Depression    GI/Hepatic Neg liver ROS, GERD  Medicated,  Endo/Other  negative endocrine ROS  Renal/GU negative Renal ROS  negative genitourinary   Musculoskeletal   Abdominal   Peds  Hematology negative hematology ROS (+)   Anesthesia Other Findings Hemochroatosis  Reproductive/Obstetrics negative OB ROS                             Anesthesia Physical Anesthesia Plan  ASA: 3  Anesthesia Plan: MAC   Post-op Pain Management:    Induction:   PONV Risk Score and Plan:   Airway Management Planned:   Additional Equipment:   Intra-op Plan:   Post-operative Plan:   Informed Consent: I have reviewed the patients History and Physical, chart, labs and discussed the procedure including the risks, benefits and alternatives for the proposed anesthesia with the patient or authorized representative who has indicated his/her understanding and acceptance.     Dental Advisory Given  Plan Discussed with: CRNA  Anesthesia Plan Comments:         Anesthesia Quick Evaluation

## 2021-09-24 NOTE — Op Note (Signed)
Date of procedure: 09/24/21  Pre-operative diagnosis: Mature Visually significant age-related cataract, Right Eye (H25.21)  Post-operative diagnosis: Mature Visually significant age-related cataract, Right Eye  Procedure: Complex Removal of cataract via phacoemulsification and insertion of intra-ocular lens Rayner [id number] +20.0D into the capsular bag of the Right Eye  Attending surgeon: Gerda Diss. Alixis Harmon, MD, MA  Anesthesia: MAC, Topical Akten  Complications: None  Estimated Blood Loss: <32m (minimal)  Specimens: None  Implants: As above  Indications:  Mature Visually significant age-related cataract, Right Eye  Procedure:  The patient was seen and identified in the pre-operative area. The operative eye was identified and dilated.  The operative eye was marked.  Topical anesthesia was administered to the operative eye.     The patient was then to the operative suite and placed in the supine position.  A timeout was performed confirming the patient, procedure to be performed, and all other relevant information.   The patient's face was prepped and draped in the usual fashion for intra-ocular surgery.  A lid speculum was placed into the operative eye and the surgical microscope moved into place and focused.  A lack of red reflex due to a mature cataract was confirmed.  A superotemporal paracentesis was created using a 20 gauge paracentesis blade.  Vision blue was injected into the anterior chamber.  Shugarcaine was injected into the anterior chamber.  Viscoelastic was injected into the anterior chamber.  A temporal clear-corneal main wound incision was created using a 2.429mmicrokeratome.  A continuous curvilinear capsulorrhexis was initiated using an irrigating cystitome and completed using capsulorrhexis forceps.  Hydrodissection and hydrodeliniation were performed.  Viscoelastic was injected into the anterior chamber.  A phacoemulsification handpiece and a chopper as a second instrument were  used to remove the nucleus and epinucleus. The irrigation/aspiration handpiece was used to remove any remaining cortical material.   The capsular bag was reinflated with viscoelastic, checked, and found to be intact. The intraocular lens was inserted into the capsular bag and dialed into place using a kuglen hook.  The irrigation/aspiration handpiece was used to remove any remaining viscoelastic.  The clear corneal wound and paracentesis wounds were then hydrated and checked with Weck-Cels to be watertight.  The lid-speculum and drape was removed, and the patient's face was cleaned with a wet and dry 4x4.  Maxitrol was instilled in the eye before a clear shield was taped over the eye. The patient was taken to the post-operative care unit in good condition, having tolerated the procedure well.  Post-Op Instructions: [facility] and will receive all other orders and instructions.

## 2021-09-24 NOTE — Anesthesia Postprocedure Evaluation (Signed)
Anesthesia Post Note  Patient: Jacob Eaton  Procedure(s) Performed: CATARACT EXTRACTION PHACO AND INTRAOCULAR LENS PLACEMENT (IOC) (Right: Eye)  Patient location during evaluation: Phase II Anesthesia Type: MAC Level of consciousness: awake Pain management: pain level controlled Vital Signs Assessment: post-procedure vital signs reviewed and stable Respiratory status: spontaneous breathing and respiratory function stable Cardiovascular status: blood pressure returned to baseline and stable Postop Assessment: no headache and no apparent nausea or vomiting Anesthetic complications: no Comments: Late entry   No notable events documented.   Last Vitals:  Vitals:   09/24/21 0930 09/24/21 1013  BP:  120/87  Pulse: 67 80  Resp: 15 16  Temp:  36.6 C  SpO2: 100% 100%    Last Pain:  Vitals:   09/24/21 1013  TempSrc: Oral  PainSc: 0-No pain                 Louann Sjogren

## 2021-09-24 NOTE — Transfer of Care (Signed)
Immediate Anesthesia Transfer of Care Note  Patient: Jacob Eaton  Procedure(s) Performed: CATARACT EXTRACTION PHACO AND INTRAOCULAR LENS PLACEMENT (IOC) (Right: Eye)  Patient Location: Short Stay  Anesthesia Type:MAC  Level of Consciousness: awake, alert  and oriented  Airway & Oxygen Therapy: Patient Spontanous Breathing  Post-op Assessment: Report given to RN and Post -op Vital signs reviewed and stable  Post vital signs: Reviewed and stable  Last Vitals:  Vitals Value Taken Time  BP    Temp    Pulse    Resp    SpO2      Last Pain:  Vitals:   09/24/21 0912  PainSc: 0-No pain         Complications: No notable events documented.

## 2021-09-27 ENCOUNTER — Encounter (HOSPITAL_COMMUNITY): Payer: Self-pay | Admitting: Ophthalmology

## 2021-10-26 ENCOUNTER — Encounter: Payer: Self-pay | Admitting: Family Medicine

## 2021-10-26 ENCOUNTER — Ambulatory Visit: Payer: Commercial Managed Care - PPO | Admitting: Family Medicine

## 2021-10-26 DIAGNOSIS — U071 COVID-19: Secondary | ICD-10-CM

## 2021-10-26 MED ORDER — MOLNUPIRAVIR EUA 200MG CAPSULE: 4.0000 | ORAL_CAPSULE | Freq: Two times a day (BID) | ORAL | 0 refills | Status: AC

## 2021-10-26 NOTE — Progress Notes (Signed)
Virtual Visit via telephone Note Due to COVID-19 pandemic this visit was conducted virtually. This visit type was conducted due to national recommendations for restrictions regarding the COVID-19 Pandemic (e.g. social distancing, sheltering in place) in an effort to limit this patient's exposure and mitigate transmission in our community. All issues noted in this document were discussed and addressed.  A physical exam was not performed with this format.   I connected with Jacob Eaton on 10/26/2021 at 1600 by telephone and verified that I am speaking with the correct person using two identifiers. Jacob Eaton is currently located at home and patient is currently with them during visit. The provider, Monia Pouch, FNP is located in their office at time of visit.  I discussed the limitations, risks, security and privacy concerns of performing an evaluation and management service by virtual visit and the availability of in person appointments. I also discussed with the patient that there may be a patient responsible charge related to this service. The patient expressed understanding and agreed to proceed.  Subjective:  Patient ID: Jacob Eaton, male    DOB: Feb 25, 1981, 41 y.o.   MRN: [id number]  Chief Complaint:  Covid Positive   HPI: Jacob Eaton is a 41 y.o. male presenting on 10/26/2021 for Covid Positive   Pt states he tested positive for COVID-19 at home this morning. States he does not have significant symptoms. He does have some slight fatigue. No other associated symptoms. He is high risk due to underlying comorbidities.     Relevant past medical, surgical, family, and social history reviewed and updated as indicated.  Allergies and medications reviewed and updated.   Past Medical History:  Diagnosis Date   Chronic tension headaches    Constipation    Depression    GERD (gastroesophageal reflux disease)    Hemochromatosis    Hemorrhoids    Documented on colonoscopy in  January 2020.    Past Surgical History:  Procedure Laterality Date   BIOPSY  10/08/2018   Procedure: BIOPSY;  Surgeon: Danie Binder, MD;  Location: [facility];  Service: Endoscopy;;  sigmoid and rectal bx's   CATARACT EXTRACTION W/PHACO Right 09/24/2021   Procedure: CATARACT EXTRACTION PHACO AND INTRAOCULAR LENS PLACEMENT ([place]);  Surgeon: Baruch Goldmann, MD;  Location: [facility] ORS;  Service: Ophthalmology;  Laterality: Right;  CDE 25.23   COLONOSCOPY N/A 10/08/2018   Dr. Oneida Alar: external and internal hemorrhoids, anal fissure.  Repeat at age 17.   ESOPHAGEAL ATRESIA REPAIR  1990    Social History   Socioeconomic History   Marital status: Single    Spouse name: Not on file   Number of children: Not on file   Years of education: Not on file   Highest education level: Not on file  Occupational History   Not on file  Tobacco Use   Smoking status: Former    Types: Cigarettes    Start date: 09/20/1999    Quit date: 09/19/2000    Years since quitting: 21.1   Smokeless tobacco: Never  Vaping Use   Vaping Use: Never used  Substance and Sexual Activity   Alcohol use: Yes    Comment: 1 mixed drink per week.   Drug use: No   Sexual activity: Not on file  Other Topics Concern   Not on file  Social History Narrative   Not on file   Social Determinants of Health   Financial Resource Strain: Not on file  Food Insecurity: Not on file  Transportation Needs:  Not on file  Physical Activity: Not on file  Stress: Not on file  Social Connections: Not on file  Intimate Partner Violence: Not on file    Outpatient Encounter Medications as of 10/26/2021  Medication Sig   molnupiravir EUA (LAGEVRIO) 200 mg CAPS capsule Take 4 capsules (800 mg total) by mouth 2 (two) times daily for 5 days.   amitriptyline (ELAVIL) 50 MG tablet Take 1 tablet (50 mg total) by mouth at bedtime.   Cholecalciferol (VITAMIN D3) 25 MCG (1000 UT) CAPS Take 2 capsules by mouth.   LINZESS 145 MCG CAPS capsule TAKE 1  TABLET BEFORE BREAKFAST.   naproxen sodium (ALEVE) 220 MG tablet Take 220 mg by mouth daily as needed.   OVER THE COUNTER MEDICATION every other day. Prune tablets takes 2 tablets daily   pantoprazole (PROTONIX) 40 MG tablet TAKE 1 TABLET BY MOUTH EVERY DAY   rizatriptan (MAXALT) 10 MG tablet TAKE 1 TABLET BY MOUTH AS NEEDED FOR migraine (MAY REPEAT in TWO hours if needed)   triamcinolone cream (KENALOG) 0.1 % Apply 1 application topically 2 (two) times daily.   No facility-administered encounter medications on file as of 10/26/2021.    Allergies  Allergen Reactions   Naproxen Hives   Permethrin Hives    Review of Systems  Constitutional:  Positive for fatigue. Negative for activity change, appetite change, chills, diaphoresis, fever and unexpected weight change.  HENT: Negative.    Eyes: Negative.   Respiratory:  Negative for cough, chest tightness and shortness of breath.   Cardiovascular:  Negative for chest pain, palpitations and leg swelling.  Gastrointestinal:  Negative for blood in stool, constipation, diarrhea, nausea and vomiting.  Endocrine: Negative.   Genitourinary:  Negative for dysuria, frequency and urgency.  Musculoskeletal:  Negative for arthralgias and myalgias.  Skin: Negative.   Allergic/Immunologic: Negative.   Neurological:  Negative for dizziness, weakness and headaches.  Hematological: Negative.  Negative for adenopathy. Does not bruise/bleed easily.  Psychiatric/Behavioral:  Negative for confusion, hallucinations, sleep disturbance and suicidal ideas.   All other systems reviewed and are negative.       Observations/Objective: No vital signs or physical exam, this was a virtual health encounter.  Pt alert and oriented, answers all questions appropriately, and able to speak in full sentences.    Assessment and Plan: Jacob Eaton was seen today for covid positive.  Diagnoses and all orders for this visit:  Positive self-administered antigen test for  COVID-19 Pt is not symptomatic at this time. Aware if he develops symptoms within the next 2 days to start antiviral therapy, otherwise, symptomatic care discussed in detail. Report any changes in symptoms. Aware of quarantine guidelines. Follow up as needed.  -     molnupiravir EUA (LAGEVRIO) 200 mg CAPS capsule; Take 4 capsules (800 mg total) by mouth 2 (two) times daily for 5 days.     Follow Up Instructions: Return if symptoms worsen or fail to improve.    I discussed the assessment and treatment plan with the patient. The patient was provided an opportunity to ask questions and all were answered. The patient agreed with the plan and demonstrated an understanding of the instructions.   The patient was advised to call back or seek an in-person evaluation if the symptoms worsen or if the condition fails to improve as anticipated.  The above assessment and management plan was discussed with the patient. The patient verbalized understanding of and has agreed to the management plan. Patient is aware to call  the clinic if they develop any new symptoms or if symptoms persist or worsen. Patient is aware when to return to the clinic for a follow-up visit. Patient educated on when it is appropriate to go to the emergency department.    I provided 15 minutes of time during this telephone encounter.   Monia Pouch, FNP-C [facility] [address] [place], [place] [address] [phone] 10/26/2021

## 2021-11-03 ENCOUNTER — Ambulatory Visit: Payer: Commercial Managed Care - PPO | Admitting: Gastroenterology

## 2021-12-09 ENCOUNTER — Other Ambulatory Visit: Payer: Self-pay | Admitting: Gastroenterology

## 2021-12-09 DIAGNOSIS — K5904 Chronic idiopathic constipation: Secondary | ICD-10-CM

## 2021-12-09 NOTE — Telephone Encounter (Signed)
Last ov 05/03/21

## 2021-12-20 ENCOUNTER — Inpatient Hospital Stay (HOSPITAL_COMMUNITY): Payer: Self-pay | Attending: Hematology

## 2021-12-27 ENCOUNTER — Ambulatory Visit (HOSPITAL_COMMUNITY): Payer: Commercial Managed Care - PPO | Admitting: Hematology

## 2022-01-05 ENCOUNTER — Ambulatory Visit: Payer: Self-pay | Admitting: Gastroenterology

## 2022-02-02 ENCOUNTER — Ambulatory Visit (INDEPENDENT_AMBULATORY_CARE_PROVIDER_SITE_OTHER): Payer: Self-pay | Admitting: Internal Medicine

## 2022-02-02 ENCOUNTER — Encounter: Payer: Self-pay | Admitting: Internal Medicine

## 2022-02-02 VITALS — BP 128/80 | HR 67 | Temp 97.7°F | Ht 71.0 in | Wt 135.0 lb

## 2022-02-02 DIAGNOSIS — K5904 Chronic idiopathic constipation: Secondary | ICD-10-CM

## 2022-02-02 DIAGNOSIS — K219 Gastro-esophageal reflux disease without esophagitis: Secondary | ICD-10-CM

## 2022-02-02 MED ORDER — LINACLOTIDE 145 MCG PO CAPS: 145.0000 ug | ORAL_CAPSULE | Freq: Every day | ORAL | 3 refills | Status: DC

## 2022-02-02 NOTE — Patient Instructions (Signed)
Continue on pantoprazole for your chronic reflux.  Let us know if you need refills. ? ?Continue on Linzess for your chronic constipation.  I sent a year supply of refills to [facility] in [place] today. ? ?You will need to go to the hospital have blood work performed.  I would also recommend you call Dr. Kirtland Bouchard to schedule follow-up visit. ? ?Follow-up with GI in 6 months or sooner if needed. ? ?It was nice meeting you today. ? ?Dr. Marletta Lor ?

## 2022-02-02 NOTE — Progress Notes (Signed)
? ? ?Referring Provider: Gabriel EaringMorgan, Tiffany M, FNP ?Primary Care Physician:  Gabriel EaringMorgan, Tiffany M, FNP ?Primary GI:  Dr. Marletta Lorarver ? ?Chief Complaint  ?Patient presents with  ? Follow-up  ?  States that his reflux and constipation are better. Will need refills on Linzess.  ? ? ?HPI:   ?Jacob Eaton is a 41 y.o. male who presents to the clinic today for follow-up visit. ? ?History of GERD well-controlled on Protonix 40 mg daily.  Denies any dysphagia/odynophagia, epigastric or chest pain, melena or hematochezia. ? ?Chronic idiopathic constipation well controlled on Linzess 145 mcg daily.  Requesting refills today. ? ?Extensive work-up for abnormal LFTs in the past ultimately found to have liver biopsy done on 01/08/2018 which showed well-preserved architecture, hepatic lobules with no significant steatosis, cholestasis or inflammation.  No pathological fibrosis.  Iron stain shows mostly mild to focal moderate granular iron disposition with hepatocytes (1-2+) of uncertain significance. 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 ? ?Follows with Dr. Kirtland BouchardK of oncology in regards to this.  States he missed his recent follow-up visit.  Does have blood work ordered.  Previous phlebotomies in the past. ? ?Past Medical History:  ?Diagnosis Date  ? Chronic tension headaches   ? Constipation   ? Depression   ? GERD (gastroesophageal reflux disease)   ? Hemochromatosis   ? Hemorrhoids   ? Documented on colonoscopy in January 2020.  ? ? ?Past Surgical History:  ?Procedure Laterality Date  ? BIOPSY  10/08/2018  ? Procedure: BIOPSY;  Surgeon: West BaliFields, Sandi L, MD;  Location: [facility];  Service: Endoscopy;;  sigmoid and rectal bx's  ? CATARACT EXTRACTION W/PHACO Right 09/24/2021  ? Procedure: CATARACT EXTRACTION PHACO AND INTRAOCULAR LENS PLACEMENT (IOC);  Surgeon: Fabio PierceWrzosek, James, MD;  Location: [facility];  Service: Ophthalmology;  Laterality: Right;  CDE 25.23  ? COLONOSCOPY N/A 10/08/2018  ? Dr. Darrick PennaFields: external and internal  hemorrhoids, anal fissure.  Repeat at age 41.  ? ESOPHAGEAL ATRESIA REPAIR  1990  ? ? ?Current Outpatient Medications  ?Medication Sig Dispense Refill  ? amitriptyline (ELAVIL) 50 MG tablet Take 1 tablet (50 mg total) by mouth at bedtime. 90 tablet 3  ? Cholecalciferol (VITAMIN D3) 25 MCG (1000 UT) CAPS Take 2 capsules by mouth.    ? OVER THE COUNTER MEDICATION every other day. Prune tablets takes 2 tablets daily    ? pantoprazole (PROTONIX) 40 MG tablet TAKE 1 TABLET BY MOUTH EVERY DAY 90 tablet 3  ? rizatriptan (MAXALT) 10 MG tablet TAKE 1 TABLET BY MOUTH AS NEEDED FOR migraine (MAY REPEAT in TWO hours if needed) 10 tablet 5  ? triamcinolone cream (KENALOG) 0.1 % Apply 1 application topically 2 (two) times daily. 80 g 2  ? linaclotide (LINZESS) 145 MCG CAPS capsule Take 1 capsule (145 mcg total) by mouth daily before breakfast. 90 capsule 3  ? ?No current facility-administered medications for this visit.  ? ? ?Allergies as of 02/02/2022 - Review Complete 02/02/2022  ?Allergen Reaction Noted  ? Naproxen Hives 04/27/2017  ? Permethrin Hives 11/19/2020  ? ? ?Family History  ?Problem Relation Age of Onset  ? Seizures Mother   ? Heart disease Father   ? Cancer Father   ? Asthma Sister   ? Colon cancer Neg Hx   ? Colon polyps Neg Hx   ? ? ?Social History  ? ?Socioeconomic History  ? Marital status: Single  ?  Spouse name: Not on file  ? Number of children: Not on file  ?  Years of education: Not on file  ? Highest education level: Not on file  ?Occupational History  ? Not on file  ?Tobacco Use  ? Smoking status: Former  ?  Types: Cigarettes  ?  Start date: 09/20/1999  ?  Quit date: 09/19/2000  ?  Years since quitting: 21.3  ? Smokeless tobacco: Never  ?Vaping Use  ? Vaping Use: Never used  ?Substance and Sexual Activity  ? Alcohol use: Yes  ?  Comment: 2 mixed drink per week.  ? Drug use: No  ? Sexual activity: Yes  ?Other Topics Concern  ? Not on file  ?Social History Narrative  ? Not on file  ? ?Social Determinants of  Health  ? ?Financial Resource Strain: Not on file  ?Food Insecurity: Not on file  ?Transportation Needs: Not on file  ?Physical Activity: Not on file  ?Stress: Not on file  ?Social Connections: Not on file  ? ? ?Subjective: ?Review of Systems  ?Constitutional:  Negative for chills and fever.  ?HENT:  Negative for congestion and hearing loss.   ?Eyes:  Negative for blurred vision and double vision.  ?Respiratory:  Negative for cough and shortness of breath.   ?Cardiovascular:  Negative for chest pain and palpitations.  ?Gastrointestinal:  Positive for constipation and heartburn. Negative for abdominal pain, blood in stool, diarrhea, melena and vomiting.  ?Genitourinary:  Negative for dysuria and urgency.  ?Musculoskeletal:  Negative for joint pain and myalgias.  ?Skin:  Negative for itching and rash.  ?Neurological:  Negative for dizziness and headaches.  ?Psychiatric/Behavioral:  Negative for depression. The patient is not nervous/anxious.   ? ? ?Objective: ?BP 128/80 (BP Location: Right Arm, Patient Position: Sitting, Cuff Size: Normal)   Pulse 67   Temp 97.7 ?F (36.5 ?C) (Temporal)   Ht 5\' 11"  (1.803 m)   Wt 135 lb (61.2 kg)   SpO2 98%   BMI 18.83 kg/m?  ?Physical Exam ?Constitutional:   ?   Appearance: Normal appearance.  ?HENT:  ?   Head: Normocephalic and atraumatic.  ?Eyes:  ?   Extraocular Movements: Extraocular movements intact.  ?   Conjunctiva/sclera: Conjunctivae normal.  ?Cardiovascular:  ?   Rate and Rhythm: Normal rate and regular rhythm.  ?Pulmonary:  ?   Effort: Pulmonary effort is normal.  ?   Breath sounds: Normal breath sounds.  ?Abdominal:  ?   General: Bowel sounds are normal.  ?   Palpations: Abdomen is soft.  ?Musculoskeletal:     ?   General: Normal range of motion.  ?   Cervical back: Normal range of motion and neck supple.  ?Skin: ?   General: Skin is warm.  ?Neurological:  ?   General: No focal deficit present.  ?   Mental Status: He is alert and oriented to person, place, and time.   ?Psychiatric:     ?   Mood and Affect: Mood normal.     ?   Behavior: Behavior normal.  ? ? ? ?Assessment: ?*GERD-well-controlled on pantoprazole daily ?*Chronic idiopathic constipation-well-controlled Linzess 145 mcg daily ?*Hemochromatosis ? ?Plan: ?GERD well-controlled on pantoprazole daily.  We will continue. ? ?Constipation well controlled Linzess.  Refill today. ? ?Patient states he missed follow-up appointment in regards to hemochromatosis.  Recommend he go to the hospital to have blood work performed.  These orders are still active including CBC, CMP, vitamin D, iron/ferritin. ? ?Also recommended he call Dr. office to schedule follow-up appointment. ? ?Otherwise follow-up with GI in 6  months. ? ?02/02/2022 1:37 PM ? ? ?Disclaimer: This note was dictated with voice recognition software. Similar sounding words can inadvertently be transcribed and may not be corrected upon review. ? ?

## 2022-03-02 ENCOUNTER — Telehealth: Payer: Self-pay

## 2022-03-02 NOTE — Telephone Encounter (Signed)
Pt needs [place] to try and find out whats going on with his Linzess. Will check Cover My Meds.

## 2022-03-02 NOTE — Telephone Encounter (Signed)
Returned the pt's call from vm and pt didn't answer. LMOVM for him to return call.

## 2022-03-16 NOTE — Telephone Encounter (Signed)
Pt came in office to check on status. I contacted the pharmacy and obtained the information needed for the PA. According to cover my meds a PA was not required for Linzess. Contacted the pharmacy back and they are going to call the insurance to investigate further. Pt was made aware.

## 2022-03-16 NOTE — Telephone Encounter (Signed)
Paperwork received from [facility] will fill out and fax back.

## 2022-03-21 NOTE — Telephone Encounter (Signed)
PA for Linzess Caps 145 mcg has been approved until 03/17/2023. Approval letter will be scanned into patient's chart.

## 2022-03-21 NOTE — Telephone Encounter (Signed)
noted 

## 2022-03-31 ENCOUNTER — Other Ambulatory Visit (HOSPITAL_COMMUNITY): Payer: Self-pay

## 2022-03-31 ENCOUNTER — Inpatient Hospital Stay (HOSPITAL_COMMUNITY): Payer: BC Managed Care – PPO | Attending: Hematology

## 2022-03-31 DIAGNOSIS — Z836 Family history of other diseases of the respiratory system: Secondary | ICD-10-CM | POA: Diagnosis not present

## 2022-03-31 DIAGNOSIS — R634 Abnormal weight loss: Secondary | ICD-10-CM | POA: Diagnosis not present

## 2022-03-31 DIAGNOSIS — E559 Vitamin D deficiency, unspecified: Secondary | ICD-10-CM | POA: Diagnosis not present

## 2022-03-31 DIAGNOSIS — Z809 Family history of malignant neoplasm, unspecified: Secondary | ICD-10-CM | POA: Diagnosis not present

## 2022-03-31 DIAGNOSIS — Z8719 Personal history of other diseases of the digestive system: Secondary | ICD-10-CM | POA: Diagnosis not present

## 2022-03-31 DIAGNOSIS — Z886 Allergy status to analgesic agent status: Secondary | ICD-10-CM | POA: Insufficient documentation

## 2022-03-31 DIAGNOSIS — R5383 Other fatigue: Secondary | ICD-10-CM | POA: Diagnosis not present

## 2022-03-31 DIAGNOSIS — R109 Unspecified abdominal pain: Secondary | ICD-10-CM | POA: Insufficient documentation

## 2022-03-31 DIAGNOSIS — Z79899 Other long term (current) drug therapy: Secondary | ICD-10-CM | POA: Insufficient documentation

## 2022-03-31 DIAGNOSIS — Z87891 Personal history of nicotine dependence: Secondary | ICD-10-CM | POA: Diagnosis not present

## 2022-03-31 DIAGNOSIS — K219 Gastro-esophageal reflux disease without esophagitis: Secondary | ICD-10-CM | POA: Insufficient documentation

## 2022-03-31 DIAGNOSIS — Z8249 Family history of ischemic heart disease and other diseases of the circulatory system: Secondary | ICD-10-CM | POA: Diagnosis not present

## 2022-03-31 DIAGNOSIS — Z820 Family history of epilepsy and other diseases of the nervous system: Secondary | ICD-10-CM | POA: Diagnosis not present

## 2022-03-31 DIAGNOSIS — H26491 Other secondary cataract, right eye: Secondary | ICD-10-CM | POA: Diagnosis not present

## 2022-03-31 DIAGNOSIS — M2550 Pain in unspecified joint: Secondary | ICD-10-CM | POA: Diagnosis not present

## 2022-03-31 DIAGNOSIS — R519 Headache, unspecified: Secondary | ICD-10-CM | POA: Insufficient documentation

## 2022-03-31 LAB — COMPREHENSIVE METABOLIC PANEL
ALT: 65 U/L — ABNORMAL HIGH (ref 0–44)
AST: 43 U/L — ABNORMAL HIGH (ref 15–41)
Albumin: 4 g/dL (ref 3.5–5.0)
Alkaline Phosphatase: 350 U/L — ABNORMAL HIGH (ref 38–126)
Anion gap: 6 (ref 5–15)
BUN: 13 mg/dL (ref 6–20)
CO2: 28 mmol/L (ref 22–32)
Calcium: 8.9 mg/dL (ref 8.9–10.3)
Chloride: 102 mmol/L (ref 98–111)
Creatinine, Ser: 0.97 mg/dL (ref 0.61–1.24)
GFR, Estimated: >60 mL/min (ref >60)
Glucose, Bld: 90 mg/dL (ref 70–99)
Potassium: 3.4 mmol/L — ABNORMAL LOW (ref 3.5–5.1)
Sodium: 136 mmol/L (ref 135–145)
Total Bilirubin: 0.7 mg/dL (ref 0.3–1.2)
Total Protein: 7 g/dL (ref 6.5–8.1)

## 2022-03-31 LAB — FERRITIN: Ferritin: 71 ng/mL (ref 24–336)

## 2022-03-31 LAB — CBC WITH DIFFERENTIAL/PLATELET
Abs Immature Granulocytes: 0.02 10*3/uL (ref 0.00–0.07)
Basophils Absolute: 0 10*3/uL (ref 0.0–0.1)
Basophils Relative: 1 %
Eosinophils Absolute: 0.1 10*3/uL (ref 0.0–0.5)
Eosinophils Relative: 2 %
HCT: 44.6 % (ref 39.0–52.0)
Hemoglobin: 15.4 g/dL (ref 13.0–17.0)
Immature Granulocytes: 0 %
Lymphocytes Relative: 13 %
Lymphs Abs: 0.7 10*3/uL (ref 0.7–4.0)
MCH: 33.1 pg (ref 26.0–34.0)
MCHC: 34.5 g/dL (ref 30.0–36.0)
MCV: 95.9 fL (ref 80.0–100.0)
Monocytes Absolute: 0.5 10*3/uL (ref 0.1–1.0)
Monocytes Relative: 11 %
Neutro Abs: 3.7 10*3/uL (ref 1.7–7.7)
Neutrophils Relative %: 73 %
Platelets: 246 10*3/uL (ref 150–400)
RBC: 4.65 MIL/uL (ref 4.22–5.81)
RDW: 12.7 % (ref 11.5–15.5)
WBC: 5 10*3/uL (ref 4.0–10.5)
nRBC: 0 % (ref 0.0–0.2)

## 2022-03-31 LAB — IRON AND TIBC
Iron: 132 ug/dL (ref 45–182)
Saturation Ratios: 45 % — ABNORMAL HIGH (ref 17.9–39.5)
TIBC: 294 ug/dL (ref 250–450)
UIBC: 162 ug/dL

## 2022-03-31 LAB — VITAMIN D 25 HYDROXY (VIT D DEFICIENCY, FRACTURES): Vit D, 25-Hydroxy: 14.41 ng/mL — ABNORMAL LOW (ref 30–100)

## 2022-04-02 NOTE — Progress Notes (Signed)
Jacob Eaton, Jacob Eaton [id number]   CLINIC:  Medical Oncology/Hematology  PCP:  Gwenlyn Perking, [place] [address] [phone]   REASON FOR VISIT:  Follow-up for hereditary hemochromatosis  CURRENT THERAPY: Intermittent phlebotomies  INTERVAL HISTORY:  Jacob Eaton 41 y.o. male returns for routine follow-up of his hereditary hemochromatosis.  He was last seen by Dr. Delton Coombes on 06/17/2021.  At today's visit, he reports feeling fairly well.  No recent hospitalizations, surgeries, or changes in baseline health status.  He has not had any therapeutic phlebotomy or blood donation since about 2 years ago, although he did donate plasma few months ago.  Patient reports baseline arthritis pain.  He has mild intermittent abdominal pain which is relieved with bowel movement.  He does report that he feels "more tired than usual."  He denies any bronzing of his skin.  He reports darker urine that "smells like iron" for the past 2 months.  Patient reports significant unintentional weight loss, with weight in August 2022 at 151 pounds and weight today 135 pounds.  He reports that he has been eating good at home, denies any changes in his diet or activity level.  Although he does not weigh himself at home, he reports that he had to go down to a smaller clothing size.  He denies any unexplained fever, chills, or night sweats.  No nausea, vomiting, or diarrhea.   REVIEW OF SYSTEMS:  Review of Systems  Constitutional:  Positive for fatigue and unexpected weight change. Negative for appetite change, chills, diaphoresis and fever.  HENT:   Negative for lump/mass and nosebleeds.   Eyes:  Negative for eye problems.  Respiratory:  Negative for cough, hemoptysis and shortness of breath.   Cardiovascular:  Negative for chest pain, leg swelling and palpitations.  Gastrointestinal:  Negative for abdominal pain, blood in stool, constipation, diarrhea,  nausea and vomiting.  Genitourinary:  Negative for hematuria.   Musculoskeletal:  Positive for arthralgias.  Skin: Negative.   Neurological:  Positive for headaches. Negative for dizziness and light-headedness.  Hematological:  Does not bruise/bleed easily.  Psychiatric/Behavioral:  Positive for sleep disturbance.       PAST MEDICAL/SURGICAL HISTORY:  Past Medical History:  Diagnosis Date   Chronic tension headaches    Constipation    Depression    GERD (gastroesophageal reflux disease)    Hemochromatosis    Hemorrhoids    Documented on colonoscopy in January 2020.   Past Surgical History:  Procedure Laterality Date   BIOPSY  10/08/2018   Procedure: BIOPSY;  Surgeon: Danie Binder, MD;  [facility];  Service: Endoscopy;;  sigmoid and rectal bx's   CATARACT EXTRACTION W/PHACO Right 09/24/2021   Procedure: CATARACT EXTRACTION PHACO AND INTRAOCULAR LENS PLACEMENT ([facility]);  Surgeon: Baruch Goldmann, MD;  Location: [facility];  Service: Ophthalmology;  Laterality: Right;  CDE 25.23   COLONOSCOPY N/A 10/08/2018   Dr. Oneida Alar: external and internal hemorrhoids, anal fissure.  Repeat at age 51.   ESOPHAGEAL ATRESIA REPAIR  1990     SOCIAL HISTORY:  Social History   Socioeconomic History   Marital status: Single    Spouse name: Not on file   Number of children: Not on file   Years of education: Not on file   Highest education level: Not on file  Occupational History   Not on file  Tobacco Use   Smoking status: Former    Types: Cigarettes  Start date: 09/20/1999    Quit date: 09/19/2000    Years since quitting: 21.5   Smokeless tobacco: Never  Vaping Use   Vaping Use: Never used  Substance and Sexual Activity   Alcohol use: Yes    Comment: 2 mixed drink per week.   Drug use: No   Sexual activity: Yes  Other Topics Concern   Not on file  Social History Narrative   Not on file   Social Determinants of Health   Financial Resource Strain: Not on file  Food  Insecurity: Not on file  Transportation Needs: Not on file  Physical Activity: Not on file  Stress: Not on file  Social Connections: Not on file  Intimate Partner Violence: Not on file    FAMILY HISTORY:  Family History  Problem Relation Age of Onset   Seizures Mother    Heart disease Father    Cancer Father    Asthma Sister    Colon cancer Neg Hx    Colon polyps Neg Hx     CURRENT MEDICATIONS:  Outpatient Encounter Medications as of 04/04/2022  Medication Sig   amitriptyline (ELAVIL) 50 MG tablet Take 1 tablet (50 mg total) by mouth at bedtime.   Cholecalciferol (VITAMIN D3) 25 MCG (1000 UT) CAPS Take 2 capsules by mouth.   linaclotide (LINZESS) 145 MCG CAPS capsule Take 1 capsule (145 mcg total) by mouth daily before breakfast.   OVER THE COUNTER MEDICATION every other day. Prune tablets takes 2 tablets daily   pantoprazole (PROTONIX) 40 MG tablet TAKE 1 TABLET BY MOUTH EVERY DAY   rizatriptan (MAXALT) 10 MG tablet TAKE 1 TABLET BY MOUTH AS NEEDED FOR migraine (MAY REPEAT in TWO hours if needed)   triamcinolone cream (KENALOG) 0.1 % Apply 1 application topically 2 (two) times daily.   No facility-administered encounter medications on file as of 04/04/2022.    ALLERGIES:  Allergies  Allergen Reactions   Naproxen Hives   Permethrin Hives     PHYSICAL EXAM:  ECOG PERFORMANCE STATUS: 1 - Symptomatic but completely ambulatory  There were no vitals filed for this visit. There were no vitals filed for this visit. Physical Exam Constitutional:      Appearance: Normal appearance. He is underweight.  HENT:     Head: Normocephalic and atraumatic.     Mouth/Throat:     Mouth: Mucous membranes are moist.  Eyes:     Extraocular Movements: Extraocular movements intact.     Pupils: Pupils are equal, round, and reactive to light.  Cardiovascular:     Rate and Rhythm: Normal rate and regular rhythm.     Pulses: Normal pulses.     Heart sounds: Normal heart sounds.   Pulmonary:     Effort: Pulmonary effort is normal.     Breath sounds: Normal breath sounds.  Abdominal:     General: Bowel sounds are normal.     Palpations: Abdomen is soft.     Tenderness: There is no abdominal tenderness.  Musculoskeletal:        General: No swelling.     Right lower leg: No edema.     Left lower leg: No edema.  Lymphadenopathy:     Cervical: No cervical adenopathy.  Skin:    General: Skin is warm and dry.  Neurological:     General: No focal deficit present.     Mental Status: He is alert and oriented to person, place, and time.  Psychiatric:  Mood and Affect: Mood normal.        Behavior: Behavior normal.      LABORATORY DATA:  I have reviewed the labs as listed.  CBC    Component Value Date/Time   WBC 5.0 03/31/2022 0952   RBC 4.65 03/31/2022 0952   HGB 15.4 03/31/2022 0952   HGB 14.8 11/19/2020 1106   HCT 44.6 03/31/2022 0952   HCT 43.8 11/19/2020 1106   PLT 246 03/31/2022 0952   PLT 205 11/19/2020 1106   MCV 95.9 03/31/2022 0952   MCV 94 11/19/2020 1106   MCH 33.1 03/31/2022 0952   MCHC 34.5 03/31/2022 0952   RDW 12.7 03/31/2022 0952   RDW 11.9 11/19/2020 1106   LYMPHSABS 0.7 03/31/2022 0952   LYMPHSABS 0.8 11/19/2020 1106   MONOABS 0.5 03/31/2022 0952   EOSABS 0.1 03/31/2022 0952   EOSABS 0.1 11/19/2020 1106   BASOSABS 0.0 03/31/2022 0952   BASOSABS 0.0 11/19/2020 1106      Latest Ref Rng & Units 03/31/2022    9:52 AM 05/06/2021    1:35 PM 01/25/2021   10:26 AM  CMP  Glucose 70 - 99 mg/dL 90   98   BUN 6 - 20 mg/dL 13   10   Creatinine 0.61 - 1.24 mg/dL 0.97   0.86   Sodium 135 - 145 mmol/L 136   136   Potassium 3.5 - 5.1 mmol/L 3.4   3.7   Chloride 98 - 111 mmol/L 102   101   CO2 22 - 32 mmol/L 28   28   Calcium 8.9 - 10.3 mg/dL 8.9   9.2   Total Protein 6.5 - 8.1 g/dL 7.0  6.8  7.5   Total Bilirubin 0.3 - 1.2 mg/dL 0.7  0.7  0.7   Alkaline Phos 38 - 126 U/L 350  181  253   AST 15 - 41 U/L 43  27  58   ALT 0 - 44 U/L  65  32  61     DIAGNOSTIC IMAGING:  I have independently reviewed the relevant imaging and discussed with the patient.  ASSESSMENT & PLAN: 1.  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  No pathological fibrosis.  Iron stain shows mostly mild to focal moderate granular iron disposition with hepatocytes (0-9+) of uncertain significance. - Not a regular drinker.  No family history of hemochromatosis on the mother side.  Does not know the family history on the father's side.  Hepatitis panel was negative.  Normal ceruloplasmin. - He has been counseled about well-balanced diet and avoid iron and vitamin C supplements. - He was previously getting phlebotomies every 8 weeks at [facility] in [place].  Last phlebotomy was December 2021 - Slightly increased fatigue.  Baseline arthritic pain.  Mild intermittent abdominal pain relieved with bowel movement.  Reports darker urine that "smells like iron" for the past 2 months. - Most recent iron panel (03/31/2022) shows ferritin 71 with iron saturation 45%.  Goal is ferritin < 50. - CMP (03/31/2022) with mild elevations of LFTs with AST 43 and ALT 65.  Alk phos is increased from previous at 350.  CBC normal. - PLAN: Recommend therapeutic phlebotomy x1 (via blood donation) - Repeat labs and RTC in 3 months  - Counseled about well-balanced  diet and avoiding iron and vitamin C supplements. - Continue GI follow-up (Dr. Abbey Chatters)  2.  Vitamin D deficiency: - Was previously taking vitamin D 2000 units daily, reportedly stopped taking them because he "felt weird" - Most recent vitamin D (03/31/2022) is low at 14.41 - PLAN: Patient instructed to restart his vitamin D supplementation, but to try a different brand  of OTC vitamin D.  3.  Unintentional weight loss -  Significant unintentional weight loss, with weight in August 2022 at 151 pounds and weight today 135 pounds. - He reports that he has been eating well at home, denies any changes in his diet or activity level. - He denies any unexplained fever, chills, or night sweats.  No nausea, vomiting, or diarrhea.  Occasional mild abdominal pain relieved with bowel movement. - PLAN: We will check CT abdomen/pelvis with phone visit after to discuss results.   PLAN SUMMARY & DISPOSITION: CT abdomen/pelvis Phone visit after CT scan Labs in 3 months Office visit in 3 months, one week after labs  All questions were answered. The patient knows to call the clinic with any problems, questions or concerns.  Medical decision making: Moderate  Time spent on visit: I spent 20 minutes counseling the patient face to face. The total time spent in the appointment was 30 minutes and more than 50% was on counseling.   Harriett Rush, PA-C  04/04/2022 9:34 AM

## 2022-04-04 ENCOUNTER — Inpatient Hospital Stay (HOSPITAL_COMMUNITY): Payer: BC Managed Care – PPO | Admitting: Physician Assistant

## 2022-04-04 DIAGNOSIS — R5383 Other fatigue: Secondary | ICD-10-CM | POA: Diagnosis not present

## 2022-04-04 DIAGNOSIS — Z87891 Personal history of nicotine dependence: Secondary | ICD-10-CM | POA: Diagnosis not present

## 2022-04-04 DIAGNOSIS — E559 Vitamin D deficiency, unspecified: Secondary | ICD-10-CM

## 2022-04-04 DIAGNOSIS — Z8249 Family history of ischemic heart disease and other diseases of the circulatory system: Secondary | ICD-10-CM | POA: Diagnosis not present

## 2022-04-04 DIAGNOSIS — Z8719 Personal history of other diseases of the digestive system: Secondary | ICD-10-CM | POA: Diagnosis not present

## 2022-04-04 DIAGNOSIS — Z809 Family history of malignant neoplasm, unspecified: Secondary | ICD-10-CM | POA: Diagnosis not present

## 2022-04-04 DIAGNOSIS — Z836 Family history of other diseases of the respiratory system: Secondary | ICD-10-CM | POA: Diagnosis not present

## 2022-04-04 DIAGNOSIS — K219 Gastro-esophageal reflux disease without esophagitis: Secondary | ICD-10-CM | POA: Diagnosis not present

## 2022-04-04 DIAGNOSIS — Z79899 Other long term (current) drug therapy: Secondary | ICD-10-CM | POA: Diagnosis not present

## 2022-04-04 DIAGNOSIS — R634 Abnormal weight loss: Secondary | ICD-10-CM

## 2022-04-04 DIAGNOSIS — Z886 Allergy status to analgesic agent status: Secondary | ICD-10-CM | POA: Diagnosis not present

## 2022-04-04 DIAGNOSIS — Z820 Family history of epilepsy and other diseases of the nervous system: Secondary | ICD-10-CM | POA: Diagnosis not present

## 2022-04-04 DIAGNOSIS — M2550 Pain in unspecified joint: Secondary | ICD-10-CM | POA: Diagnosis not present

## 2022-04-04 DIAGNOSIS — R109 Unspecified abdominal pain: Secondary | ICD-10-CM | POA: Diagnosis not present

## 2022-04-04 DIAGNOSIS — R519 Headache, unspecified: Secondary | ICD-10-CM | POA: Diagnosis not present

## 2022-04-04 NOTE — Patient Instructions (Addendum)
[facility] at [facility] Discharge Instructions  You were seen today by Rojelio Brenner PA-C for your elevated iron levels ("hereditary hemochromatosis").  Your iron levels are starting to become higher again.  I recommend that you donate blood once in the next few weeks to help decrease your iron levels.  Since you have lost a significant amount of weight unintentionally in the past 6 months, I would also like to check a CT scan of your abdomen to see if there is any reason that you would be losing weight.  Make sure that you are eating plenty of food at home to try to regain some of the weight you lost.  It is also important that you start taking your vitamin D supplement again.  You should take vitamin D 2000 units once daily.  We will schedule a telephone visit to discuss the results of the CT abdomen. We will recheck your labs and see you for an office visit in 3 months.    Thank you for choosing [facility] at [facility] to provide your oncology and hematology care.  To afford each patient quality time with our provider, please arrive at least 15 minutes before your scheduled appointment time.   If you have a lab appointment with the Cancer Center please come in thru the Main Entrance and check in at the main information desk.  You need to re-schedule your appointment should you arrive 10 or more minutes late.  We strive to give you quality time with our providers, and arriving late affects you and other patients whose appointments are after yours.  Also, if you no show three or more times for appointments you may be dismissed from the clinic at the providers discretion.     Again, thank you for choosing [facility].  Our hope is that these requests will decrease the amount of time that you wait before being seen by our physicians.       _____________________________________________________________  Should you have questions  after your visit to [facility], please contact our office at [phone] and follow the prompts.  Our office hours are 8:00 a.m. and 4:30 p.m. Monday - Friday.  Please note that voicemails left after 4:00 p.m. may not be returned until the following business day.  We are closed weekends and major holidays.  You do have access to a nurse 24-7, just call the main number to the clinic [phone] and do not press any options, hold on the line and a nurse will answer the phone.    For prescription refill requests, have your pharmacy contact our office and allow 72 hours.    Due to Covid, you will need to wear a mask upon entering the hospital. If you do not have a mask, a mask will be given to you at the Main Entrance upon arrival. For doctor visits, patients may have 1 support person age 67 or older with them. For treatment visits, patients can not have anyone with them due to social distancing guidelines and our immunocompromised population.

## 2022-04-13 ENCOUNTER — Ambulatory Visit (HOSPITAL_COMMUNITY)
Admission: RE | Admit: 2022-04-13 | Discharge: 2022-04-13 | Disposition: A | Discharge status: Home / Self Care | Payer: BC Managed Care – PPO | Source: Ambulatory Visit | Attending: Physician Assistant | Admitting: Physician Assistant

## 2022-04-13 DIAGNOSIS — R634 Abnormal weight loss: Secondary | ICD-10-CM | POA: Diagnosis not present

## 2022-04-13 DIAGNOSIS — R7989 Other specified abnormal findings of blood chemistry: Secondary | ICD-10-CM | POA: Diagnosis not present

## 2022-04-13 DIAGNOSIS — R945 Abnormal results of liver function studies: Secondary | ICD-10-CM | POA: Diagnosis not present

## 2022-04-13 MED ORDER — IOHEXOL 300 MG/ML  SOLN
100.0000 mL | Freq: Once | INTRAMUSCULAR | Status: AC | PRN
  Administered 2022-04-13: 100 mL via INTRAVENOUS

## 2022-04-18 NOTE — Progress Notes (Signed)
Virtual Visit via Telephone Note [facility]  I connected with Hershal Coria  on 04/19/22  at  11:15 AM by telephone and verified that I am speaking with the correct person using two identifiers.  Location: Patient: Home Provider: [facility]   I discussed the limitations, risks, security and privacy concerns of performing an evaluation and management service by telephone and the availability of in person appointments. I also discussed with the patient that there may be a patient responsible charge related to this service. The patient expressed understanding and agreed to proceed.  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 Dvante Rogier follows at our clinic for hereditary hemochromatosis, which was addressed during his office visit with Rojelio Brenner PA-C on 04/04/2022.  He is contacted today to discuss results of CT scan obtained due to unintentional weight loss.  During his last visit, he was noted to have had significant weight loss of 15 pounds in 6 months (about 10% body weight).    He reports that he has been eating good at home, denies any changes in his diet or activity level.  Although he does not weigh himself at home, he reports that he had to go down to a smaller clothing size.  He denies any unexplained fever, chills, or night sweats.  No nausea, vomiting, or diarrhea.    OBSERVATIONS/OBJECTIVE: Review of Systems  Constitutional:  Positive for weight loss. Negative for chills, diaphoresis, fever and malaise/fatigue.  Respiratory:  Negative for cough and shortness of breath.   Cardiovascular:  Negative for chest pain and palpitations.  Gastrointestinal:  Negative for abdominal pain, blood in stool, melena, nausea and vomiting.  Neurological:  Negative for dizziness and headaches.   Psychiatric/Behavioral:  The patient has insomnia.      PHYSICAL EXAM (per limitations of virtual telephone visit): The patient is alert and oriented x 3, exhibiting adequate mentation, good mood, and ability to speak in full sentences and execute sound judgement.   ASSESSMENT & PLAN: 1.  Unintentional weight loss - Patient has lost 15 pounds in the past 6 months (10% decrease in body weight) - He reports that he has been eating well at home, denies any changes in his diet or activity level. - He denies any unexplained fever, chills, or night sweats.  No nausea, vomiting, or diarrhea.  Occasional mild abdominal pain relieved with bowel movement. - CT abdomen/pelvis (04/13/2022): No acute abnormality in abdomen or pelvis - PLAN: No further work-up at this time, but we will continue to monitor weight at follow-up visits.  Recommend discussing with PCP as well and focusing on increased caloric intake at home.  2.  Vitamin D deficiency - Was previously taking vitamin D 2000 units daily, reportedly stopped taking them because he "felt weird" - Most recent vitamin D (03/31/2022) is low at 14.41 - PLAN: Prescription sent for vitamin D2 (ergocalciferol) 50,000 units weekly.  We will recheck levels at follow-up in 3 months.  3.  Hemochromatosis (compound heterozygosity for C282Y and H63D mutation) - Not specifically addressed during today's telephone visit. - Was addressed in progress note/office visit from 04/04/2022    I discussed the assessment and treatment plan with the patient. The patient was provided an opportunity to ask questions and all were answered. The patient agreed with the plan and demonstrated an understanding of the instructions.   The patient  was advised to call back or seek an in-person evaluation if the symptoms worsen or if the condition fails to improve as anticipated.  I provided 7 minutes of non-face-to-face time during this encounter.   Carnella Guadalajara,  PA-C 04/19/22 11:26 AM

## 2022-04-19 ENCOUNTER — Inpatient Hospital Stay: Payer: BC Managed Care – PPO | Attending: Physician Assistant | Admitting: Physician Assistant

## 2022-04-19 ENCOUNTER — Encounter: Payer: Self-pay | Admitting: *Deleted

## 2022-04-19 DIAGNOSIS — R634 Abnormal weight loss: Secondary | ICD-10-CM | POA: Diagnosis not present

## 2022-04-19 DIAGNOSIS — E559 Vitamin D deficiency, unspecified: Secondary | ICD-10-CM

## 2022-04-19 MED ORDER — ERGOCALCIFEROL 1.25 MG (50000 UT) PO CAPS: 50000.0000 [IU] | ORAL_CAPSULE | ORAL | 3 refills | Status: DC

## 2022-04-19 NOTE — Progress Notes (Signed)
Order for 1 x therapeutic phlebotomy sent to [facility] via fax @ [phone] Ph [phone], per Rojelio Brenner - PAC order.

## 2022-05-25 ENCOUNTER — Ambulatory Visit (INDEPENDENT_AMBULATORY_CARE_PROVIDER_SITE_OTHER): Payer: BC Managed Care – PPO | Admitting: Family Medicine

## 2022-05-25 ENCOUNTER — Encounter: Payer: Self-pay | Admitting: Family Medicine

## 2022-05-25 VITALS — BP 111/68 | HR 69 | Temp 98.5°F | Ht 71.0 in | Wt 137.0 lb

## 2022-05-25 DIAGNOSIS — E7800 Pure hypercholesterolemia, unspecified: Secondary | ICD-10-CM

## 2022-05-25 DIAGNOSIS — Z0000 Encounter for general adult medical examination without abnormal findings: Secondary | ICD-10-CM | POA: Diagnosis not present

## 2022-05-25 DIAGNOSIS — Z0001 Encounter for general adult medical examination with abnormal findings: Secondary | ICD-10-CM

## 2022-05-25 DIAGNOSIS — Z23 Encounter for immunization: Secondary | ICD-10-CM | POA: Diagnosis not present

## 2022-05-25 DIAGNOSIS — R636 Underweight: Secondary | ICD-10-CM | POA: Diagnosis not present

## 2022-05-25 DIAGNOSIS — K219 Gastro-esophageal reflux disease without esophagitis: Secondary | ICD-10-CM | POA: Diagnosis not present

## 2022-05-25 DIAGNOSIS — Z136 Encounter for screening for cardiovascular disorders: Secondary | ICD-10-CM | POA: Diagnosis not present

## 2022-05-25 NOTE — Progress Notes (Signed)
Complete physical exam  Patient: Jacob Eaton   DOB: [birth year]   41 y.o. Male  MRN: [id number]  Subjective:    Chief Complaint  Patient presents with   Annual Exam    Jacob Eaton is a 41 y.o. male who presents today for a complete physical exam. He reports consuming a general diet. Home exercise routine includes walking 0.5 hrs per day. He generally feels fairly well. He reports sleeping fairly well. He does not have additional problems to discuss today.   He has been followed by hematology and GI. Reports that he needs to have phlembotomy done due to elevated levels. He reports GERD with well controlled. He has been struggling to gain weight. He has gained 2 lbs over the last few months. Reports that he has been working on a high protein, high calorie diet.   He has not eaten this morning but has had some soda prior to his appt.    Most recent fall risk assessment:    05/20/2021    8:02 AM  Fall Risk   Falls in the past year? 0     Most recent depression screenings:    05/20/2021    8:02 AM 11/19/2020   10:30 AM  PHQ 2/9 Scores  PHQ - 2 Score 0 0  PHQ- 9 Score 0     Vision:Within last year  Past Medical History:  Diagnosis Date   Chronic tension headaches    Constipation    Depression    GERD (gastroesophageal reflux disease)    Hemochromatosis    Hemorrhoids    Documented on colonoscopy in January 2020.      Patient Care Team: Gwenlyn Perking, FNP as PCP - General (Family Medicine) Eloise Harman, DO as Consulting Physician (Gastroenterology)   Outpatient Medications Prior to Visit  Medication Sig   amitriptyline (ELAVIL) 50 MG tablet Take 1 tablet (50 mg total) by mouth at bedtime.   ergocalciferol (VITAMIN D2) 1.25 MG (50000 UT) capsule Take 1 capsule (50,000 Units total) by mouth once a week.   linaclotide (LINZESS) 145 MCG CAPS capsule Take 1 capsule (145 mcg total) by mouth daily before breakfast.   OVER THE COUNTER MEDICATION every other day.  Prune tablets takes 2 tablets daily   pantoprazole (PROTONIX) 40 MG tablet TAKE 1 TABLET BY MOUTH EVERY DAY   rizatriptan (MAXALT) 10 MG tablet TAKE 1 TABLET BY MOUTH AS NEEDED FOR migraine (MAY REPEAT in TWO hours if needed)   triamcinolone cream (KENALOG) 0.1 % Apply 1 application topically 2 (two) times daily.   Cholecalciferol (VITAMIN D3) 25 MCG (1000 UT) CAPS Take 2 capsules by mouth. (Patient not taking: Reported on 05/25/2022)   No facility-administered medications prior to visit.    ROS Negative unless specially indicated above in HPI.     Objective:     BP 111/68   Pulse 69   Temp 98.5 F (36.9 C) (Temporal)   Ht 5' 11" (1.803 m)   Wt 137 lb (62.1 kg)   SpO2 99%   BMI 19.11 kg/m  Wt Readings from Last 3 Encounters:  05/25/22 137 lb (62.1 kg)  04/04/22 135 lb 12.9 oz (61.6 kg)  02/02/22 135 lb (61.2 kg)      Physical Exam Vitals and nursing note reviewed.  Constitutional:      General: He is not in acute distress.    Appearance: He is not ill-appearing, toxic-appearing or diaphoretic.  HENT:     Head: Normocephalic.  Right Ear: Tympanic membrane, ear canal and external ear normal.     Left Ear: Tympanic membrane, ear canal and external ear normal.     Nose: Nose normal.     Mouth/Throat:     Mouth: Mucous membranes are moist.     Pharynx: Oropharynx is clear.  Eyes:     General: No scleral icterus.    Extraocular Movements: Extraocular movements intact.     Conjunctiva/sclera: Conjunctivae normal.     Pupils: Pupils are equal, round, and reactive to light.  Neck:     Thyroid: No thyroid mass, thyromegaly or thyroid tenderness.     Vascular: No carotid bruit.  Cardiovascular:     Rate and Rhythm: Normal rate and regular rhythm.     Pulses: Normal pulses.     Heart sounds: Normal heart sounds. No murmur heard.    No friction rub. No gallop.  Pulmonary:     Effort: Pulmonary effort is normal.     Breath sounds: Normal breath sounds.  Abdominal:      General: Bowel sounds are normal. There is no distension.     Palpations: Abdomen is soft. There is no mass.     Tenderness: There is no abdominal tenderness. There is no guarding or rebound.  Musculoskeletal:        General: No swelling. Normal range of motion.     Cervical back: Normal range of motion and neck supple. No tenderness.     Right lower leg: No edema.     Left lower leg: No edema.  Skin:    General: Skin is warm and dry.     Capillary Refill: Capillary refill takes less than 2 seconds.     Findings: No lesion or rash.  Neurological:     General: No focal deficit present.     Mental Status: He is alert and oriented to person, place, and time.     Cranial Nerves: No cranial nerve deficit.     Sensory: No sensory deficit.     Motor: No weakness.     Coordination: Coordination normal.     Gait: Gait normal.  Psychiatric:        Mood and Affect: Mood normal.        Behavior: Behavior normal.        Thought Content: Thought content normal.      No results found for any visits on 05/25/22.     Assessment & Plan:    Routine Health Maintenance and Physical Exam  [name] was seen today for annual exam.  Diagnoses and all orders for this visit:  Routine general medical examination at a health care facility Labs pending as below. He did have soda prior to appt today.  -     CBC with Differential/Platelet -     CMP14+EGFR -     Lipid panel -     TSH  Mildly underweight adult Stable weight with 2 lb weight gain. Declined referral to nutrition. High calorie/ high protein diet recommended. No red flags.  -     CBC with Differential/Platelet -     CMP14+EGFR -     Lipid panel -     TSH  Elevated LDL cholesterol level Labs pending.  -     Lipid panel  Gastroesophageal reflux disease without esophagitis Managed by GI. Well controlled on current regimen.  -     CBC with Differential/Platelet -     CMP14+EGFR  Hereditary hemochromatosis (HCC) Managed by  hematology. Needs phlebotomy- reminded patient to schedule with [facility].  -     CBC with Differential/Platelet -     CMP14+EGFR  Need for vaccination Tdap today in office.    Immunization History  Administered Date(s) Administered   Tdap 08/05/2011    Health Maintenance  Topic Date Due   TETANUS/TDAP  08/04/2021   COVID-19 Vaccine (1) 06/05/2022 (Originally [birth year]/20/1980)   INFLUENZA VACCINE  12/18/2022 (Originally 04/19/2022)   Hepatitis C Screening  Completed   HIV Screening  Completed   HPV VACCINES  Aged Out    Discussed health benefits of physical activity, and encouraged him to engage in regular exercise appropriate for his age and condition.  Problem List Items Addressed This Visit       Digestive   Gastroesophageal reflux disease   Relevant Orders   CBC with Differential/Platelet   CMP14+EGFR     Other   Hereditary hemochromatosis ([place])   Relevant Orders   CBC with Differential/Platelet   CMP14+EGFR   Elevated LDL cholesterol level   Relevant Orders   Lipid panel   Other Visit Diagnoses     Routine general medical examination at a health care facility    -  Primary   Relevant Orders   CBC with Differential/Platelet   CMP14+EGFR   Lipid panel   TSH   Mildly underweight adult       Relevant Orders   CBC with Differential/Platelet   CMP14+EGFR   Lipid panel   TSH   Need for vaccination          Return in 1 year (on 05/26/2023) for CPE.  The patient indicates understanding of these issues and agrees with the plan.     Gwenlyn Perking, FNP

## 2022-05-25 NOTE — Patient Instructions (Signed)
Health Maintenance, Male Adopting a healthy lifestyle and getting preventive care are important in promoting health and wellness. Ask your health care provider about: The right schedule for you to have regular tests and exams. Things you can do on your own to prevent diseases and keep yourself healthy. What should I know about diet, weight, and exercise? Eat a healthy diet  Eat a diet that includes plenty of vegetables, fruits, low-fat dairy products, and lean protein. Do not eat a lot of foods that are high in solid fats, added sugars, or sodium. Maintain a healthy weight Body mass index (BMI) is a measurement that can be used to identify possible weight problems. It estimates body fat based on height and weight. Your health care provider can help determine your BMI and help you achieve or maintain a healthy weight. Get regular exercise Get regular exercise. This is one of the most important things you can do for your health. Most adults should: Exercise for at least 150 minutes each week. The exercise should increase your heart rate and make you sweat (moderate-intensity exercise). Do strengthening exercises at least twice a week. This is in addition to the moderate-intensity exercise. Spend less time sitting. Even light physical activity can be beneficial. Watch cholesterol and blood lipids Have your blood tested for lipids and cholesterol at 42 years of age, then have this test every 5 years. You may need to have your cholesterol levels checked more often if: Your lipid or cholesterol levels are high. You are older than 42 years of age. You are at high risk for heart disease. What should I know about cancer screening? Many types of cancers can be detected early and may often be prevented. Depending on your health history and family history, you may need to have cancer screening at various ages. This may include screening for: Colorectal cancer. Prostate cancer. Skin cancer. Lung  cancer. What should I know about heart disease, diabetes, and high blood pressure? Blood pressure and heart disease High blood pressure causes heart disease and increases the risk of stroke. This is more likely to develop in people who have high blood pressure readings or are overweight. Talk with your health care provider about your target blood pressure readings. Have your blood pressure checked: Every 3-5 years if you are 18-39 years of age. Every year if you are 40 years old or older. If you are between the ages of 65 and 75 and are a current or former smoker, ask your health care provider if you should have a one-time screening for abdominal aortic aneurysm (AAA). Diabetes Have regular diabetes screenings. This checks your fasting blood sugar level. Have the screening done: Once every three years after age 45 if you are at a normal weight and have a low risk for diabetes. More often and at a younger age if you are overweight or have a high risk for diabetes. What should I know about preventing infection? Hepatitis B If you have a higher risk for hepatitis B, you should be screened for this virus. Talk with your health care provider to find out if you are at risk for hepatitis B infection. Hepatitis C Blood testing is recommended for: Everyone born from 1945 through 1965. Anyone with known risk factors for hepatitis C. Sexually transmitted infections (STIs) You should be screened each year for STIs, including gonorrhea and chlamydia, if: You are sexually active and are younger than 42 years of age. You are older than 42 years of age and your   health care provider tells you that you are at risk for this type of infection. Your sexual activity has changed since you were last screened, and you are at increased risk for chlamydia or gonorrhea. Ask your health care provider if you are at risk. Ask your health care provider about whether you are at high risk for HIV. Your health care provider  may recommend a prescription medicine to help prevent HIV infection. If you choose to take medicine to prevent HIV, you should first get tested for HIV. You should then be tested every 3 months for as long as you are taking the medicine. Follow these instructions at home: Alcohol use Do not drink alcohol if your health care provider tells you not to drink. If you drink alcohol: Limit how much you have to 0-2 drinks a day. Know how much alcohol is in your drink. In the [place] one drink equals one 12 oz bottle of beer (355 mL), one 5 oz glass of wine (148 mL), or one 1 oz glass of hard liquor (44 mL). Lifestyle Do not use any products that contain nicotine or tobacco. These products include cigarettes, chewing tobacco, and vaping devices, such as e-cigarettes. If you need help quitting, ask your health care provider. Do not use street drugs. Do not share needles. Ask your health care provider for help if you need support or information about quitting drugs. General instructions Schedule regular health, dental, and eye exams. Stay current with your vaccines. Tell your health care provider if: You often feel depressed. You have ever been abused or do not feel safe at home. Summary Adopting a healthy lifestyle and getting preventive care are important in promoting health and wellness. Follow your health care provider's instructions about healthy diet, exercising, and getting tested or screened for diseases. Follow your health care provider's instructions on monitoring your cholesterol and blood pressure. This information is not intended to replace advice given to you by your health care provider. Make sure you discuss any questions you have with your health care provider. Document Revised: 01/25/2021 Document Reviewed: 01/25/2021 Elsevier Patient Education  2023 [employer]  

## 2022-05-26 LAB — CMP14+EGFR
ALT: 38 IU/L (ref 0–44)
AST: 27 IU/L (ref 0–40)
Albumin/Globulin Ratio: 2.2 (ref 1.2–2.2)
Albumin: 4.6 g/dL (ref 4.1–5.1)
Alkaline Phosphatase: 283 IU/L — ABNORMAL HIGH (ref 44–121)
BUN/Creatinine Ratio: 13 (ref 9–20)
BUN: 14 mg/dL (ref 6–24)
Bilirubin Total: 0.6 mg/dL (ref 0.0–1.2)
CO2: 24 mmol/L (ref 20–29)
Calcium: 9.6 mg/dL (ref 8.7–10.2)
Chloride: 101 mmol/L (ref 96–106)
Creatinine, Ser: 1.08 mg/dL (ref 0.76–1.27)
Globulin, Total: 2.1 g/dL (ref 1.5–4.5)
Glucose: 78 mg/dL (ref 70–99)
Potassium: 4 mmol/L (ref 3.5–5.2)
Sodium: 139 mmol/L (ref 134–144)
Total Protein: 6.7 g/dL (ref 6.0–8.5)
eGFR: 88 mL/min/{1.73_m2} (ref >59)

## 2022-05-26 LAB — LIPID PANEL
Chol/HDL Ratio: 4 ratio (ref 0.0–5.0)
Cholesterol, Total: 244 mg/dL — ABNORMAL HIGH (ref 100–199)
HDL: 61 mg/dL (ref >39)
LDL Chol Calc (NIH): 168 mg/dL — ABNORMAL HIGH (ref 0–99)
Triglycerides: 88 mg/dL (ref 0–149)
VLDL Cholesterol Cal: 15 mg/dL (ref 5–40)

## 2022-05-26 LAB — CBC WITH DIFFERENTIAL/PLATELET
Basophils Absolute: 0 10*3/uL (ref 0.0–0.2)
Basos: 1 %
EOS (ABSOLUTE): 0.1 10*3/uL (ref 0.0–0.4)
Eos: 3 %
Hematocrit: 45.8 % (ref 37.5–51.0)
Hemoglobin: 15.5 g/dL (ref 13.0–17.7)
Immature Grans (Abs): 0 10*3/uL (ref 0.0–0.1)
Immature Granulocytes: 0 %
Lymphocytes Absolute: 0.7 10*3/uL (ref 0.7–3.1)
Lymphs: 15 %
MCH: 32.8 pg (ref 26.6–33.0)
MCHC: 33.8 g/dL (ref 31.5–35.7)
MCV: 97 fL (ref 79–97)
Monocytes Absolute: 0.6 10*3/uL (ref 0.1–0.9)
Monocytes: 13 %
Neutrophils Absolute: 3.2 10*3/uL (ref 1.4–7.0)
Neutrophils: 68 %
Platelets: 189 10*3/uL (ref 150–450)
RBC: 4.73 x10E6/uL (ref 4.14–5.80)
RDW: 12.1 % (ref 11.6–15.4)
WBC: 4.6 10*3/uL (ref 3.4–10.8)

## 2022-05-26 LAB — TSH: TSH: 2.23 u[IU]/mL (ref 0.450–4.500)

## 2022-06-28 ENCOUNTER — Inpatient Hospital Stay: Payer: BC Managed Care – PPO | Attending: Physician Assistant

## 2022-06-28 DIAGNOSIS — R634 Abnormal weight loss: Secondary | ICD-10-CM | POA: Insufficient documentation

## 2022-06-28 DIAGNOSIS — E559 Vitamin D deficiency, unspecified: Secondary | ICD-10-CM | POA: Insufficient documentation

## 2022-06-28 DIAGNOSIS — Z79899 Other long term (current) drug therapy: Secondary | ICD-10-CM | POA: Insufficient documentation

## 2022-06-28 LAB — CBC WITH DIFFERENTIAL/PLATELET
Abs Immature Granulocytes: 0.02 10*3/uL (ref 0.00–0.07)
Basophils Absolute: 0 10*3/uL (ref 0.0–0.1)
Basophils Relative: 1 %
Eosinophils Absolute: 0.1 10*3/uL (ref 0.0–0.5)
Eosinophils Relative: 2 %
HCT: 47.6 % (ref 39.0–52.0)
Hemoglobin: 16.1 g/dL (ref 13.0–17.0)
Immature Granulocytes: 1 %
Lymphocytes Relative: 19 %
Lymphs Abs: 0.7 10*3/uL (ref 0.7–4.0)
MCH: 32.7 pg (ref 26.0–34.0)
MCHC: 33.8 g/dL (ref 30.0–36.0)
MCV: 96.7 fL (ref 80.0–100.0)
Monocytes Absolute: 0.5 10*3/uL (ref 0.1–1.0)
Monocytes Relative: 12 %
Neutro Abs: 2.5 10*3/uL (ref 1.7–7.7)
Neutrophils Relative %: 65 %
Platelets: 200 10*3/uL (ref 150–400)
RBC: 4.92 MIL/uL (ref 4.22–5.81)
RDW: 12.9 % (ref 11.5–15.5)
WBC: 3.8 10*3/uL — ABNORMAL LOW (ref 4.0–10.5)
nRBC: 0 % (ref 0.0–0.2)

## 2022-06-28 LAB — COMPREHENSIVE METABOLIC PANEL
ALT: 70 U/L — ABNORMAL HIGH (ref 0–44)
AST: 47 U/L — ABNORMAL HIGH (ref 15–41)
Albumin: 4.3 g/dL (ref 3.5–5.0)
Alkaline Phosphatase: 332 U/L — ABNORMAL HIGH (ref 38–126)
Anion gap: 7 (ref 5–15)
BUN: 10 mg/dL (ref 6–20)
CO2: 26 mmol/L (ref 22–32)
Calcium: 9.4 mg/dL (ref 8.9–10.3)
Chloride: 104 mmol/L (ref 98–111)
Creatinine, Ser: 0.92 mg/dL (ref 0.61–1.24)
GFR, Estimated: >60 mL/min (ref >60)
Glucose, Bld: 101 mg/dL — ABNORMAL HIGH (ref 70–99)
Potassium: 3.6 mmol/L (ref 3.5–5.1)
Sodium: 137 mmol/L (ref 135–145)
Total Bilirubin: 0.7 mg/dL (ref 0.3–1.2)
Total Protein: 7.3 g/dL (ref 6.5–8.1)

## 2022-06-28 LAB — IRON AND TIBC
Iron: 154 ug/dL (ref 45–182)
Saturation Ratios: 54 % — ABNORMAL HIGH (ref 17.9–39.5)
TIBC: 285 ug/dL (ref 250–450)
UIBC: 131 ug/dL

## 2022-06-28 LAB — VITAMIN D 25 HYDROXY (VIT D DEFICIENCY, FRACTURES): Vit D, 25-Hydroxy: 41.04 ng/mL (ref 30–100)

## 2022-06-28 LAB — FERRITIN: Ferritin: 63 ng/mL (ref 24–336)

## 2022-07-06 ENCOUNTER — Ambulatory Visit: Payer: BC Managed Care – PPO | Admitting: Physician Assistant

## 2022-07-21 ENCOUNTER — Encounter: Payer: Self-pay | Admitting: *Deleted

## 2022-07-26 NOTE — Progress Notes (Unsigned)
[place], [place] [address]   CLINIC:  Medical Oncology/Hematology  PCP:  Gwenlyn Perking, [place] [address] [phone]   REASON FOR VISIT:  Follow-up for hereditary hemochromatosis  CURRENT THERAPY: Intermittent phlebotomy  INTERVAL HISTORY:  Jacob Eaton 41 y.o. 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  He was last evaluated via telemedicine visit by Tarri Abernethy PA-C on 04/19/2022.  At today's visit, he reports feeling fairly well.  No recent hospitalizations, surgeries, or changes in baseline health status.    Patient reports baseline arthritis pain.   He denies any recent abdominal pain. He has baseline fatigue. He denies any bronzing of his skin.  He reports darker urine that "smells like iron."   Patient reports significant unintentional weight loss, with weight in August 2022 at 151 pounds and weight today 132 pounds.  (Weight today and is relatively stable compared to weight 3 months ago, which was 135 pounds.)  He reports that he has been eating good at home, denies any changes in his diet or activity level.  Although he does not weigh himself at home, he reports that he had to go down to a smaller clothing size.  He denies any unexplained fever, chills, or night sweats.  No nausea, vomiting, or diarrhea.  He has 50% energy and 100% appetite. He endorses that he is maintaining a stable weight.   REVIEW OF SYSTEMS: Review of Systems  Constitutional:  Positive for fatigue. Negative for appetite change, chills, diaphoresis, fever and unexpected weight change.  HENT:   Negative for lump/mass and nosebleeds.   Eyes:  Negative for eye problems.  Respiratory:  Negative for cough, hemoptysis and shortness of breath.   Cardiovascular:  Negative for chest pain, leg swelling and palpitations.  Gastrointestinal:  Negative for abdominal pain, blood in  stool, constipation, diarrhea, nausea and vomiting.  Genitourinary:  Negative for hematuria.   Musculoskeletal:  Positive for arthralgias.  Skin: Negative.   Neurological:  Positive for headaches and numbness. Negative for dizziness and light-headedness.  Hematological:  Does not bruise/bleed easily.      PAST MEDICAL/SURGICAL HISTORY:  Past Medical History:  Diagnosis Date   Chronic tension headaches    Constipation    Depression    GERD (gastroesophageal reflux disease)    Hemochromatosis    Hemorrhoids    Documented on colonoscopy in January 2020.   Past Surgical History:  Procedure Laterality Date   BIOPSY  10/08/2018   Procedure: BIOPSY;  Surgeon: Danie Binder, MD;  [facility];  Service: Endoscopy;;  sigmoid and rectal bx's   CATARACT EXTRACTION W/PHACO Right 09/24/2021   Procedure: CATARACT EXTRACTION PHACO AND INTRAOCULAR LENS PLACEMENT ([place]);  Surgeon: Baruch Goldmann, MD;  Location: [facility];  Service: Ophthalmology;  Laterality: Right;  CDE 25.23   COLONOSCOPY N/A 10/08/2018   Dr. Oneida Alar: external and internal hemorrhoids, anal fissure.  Repeat at age 73.   ESOPHAGEAL ATRESIA REPAIR  1990     SOCIAL HISTORY:  Social History   Socioeconomic History   Marital status: Single    Spouse name: Not on file   Number of children: Not on file   Years of education: Not on file   Highest education level: Not on file  Occupational History   Not on file  Tobacco Use   Smoking status: Former    Types: Cigarettes    Start date: 09/20/1999  Quit date: 09/19/2000    Years since quitting: 21.8   Smokeless tobacco: Never  Vaping Use   Vaping Use: Never used  Substance and Sexual Activity   Alcohol use: Yes    Comment: 2 mixed drink per week.   Drug use: No   Sexual activity: Yes  Other Topics Concern   Not on file  Social History Narrative   Not on file   Social Determinants of Health   Financial Resource Strain: Not on file  Food Insecurity: Not on file   Transportation Needs: Not on file  Physical Activity: Not on file  Stress: Not on file  Social Connections: Not on file  Intimate Partner Violence: Not on file    FAMILY HISTORY:  Family History  Problem Relation Age of Onset   Seizures Mother    Heart disease Father    Cancer Father    Asthma Sister    Colon cancer Neg Hx    Colon polyps Neg Hx     CURRENT MEDICATIONS:  Outpatient Encounter Medications as of 07/27/2022  Medication Sig   amitriptyline (ELAVIL) 50 MG tablet Take 1 tablet (50 mg total) by mouth at bedtime.   Cholecalciferol (VITAMIN D3) 25 MCG (1000 UT) CAPS Take 2 capsules by mouth. (Patient not taking: Reported on 05/25/2022)   ergocalciferol (VITAMIN D2) 1.25 MG (50000 UT) capsule Take 1 capsule (50,000 Units total) by mouth once a week.   linaclotide (LINZESS) 145 MCG CAPS capsule Take 1 capsule (145 mcg total) by mouth daily before breakfast.   OVER THE COUNTER MEDICATION every other day. Prune tablets takes 2 tablets daily   pantoprazole (PROTONIX) 40 MG tablet TAKE 1 TABLET BY MOUTH EVERY DAY   rizatriptan (MAXALT) 10 MG tablet TAKE 1 TABLET BY MOUTH AS NEEDED FOR migraine (MAY REPEAT in TWO hours if needed)   triamcinolone cream (KENALOG) 0.1 % Apply 1 application topically 2 (two) times daily.   No facility-administered encounter medications on file as of 07/27/2022.    ALLERGIES:  Allergies  Allergen Reactions   Naproxen Hives   Permethrin Hives     PHYSICAL EXAM: ECOG PERFORMANCE STATUS: 1 - Symptomatic but completely ambulatory  There were no vitals filed for this visit. There were no vitals filed for this visit. Physical Exam Constitutional:      Appearance: Normal appearance. He is underweight.  HENT:     Head: Normocephalic and atraumatic.     Mouth/Throat:     Mouth: Mucous membranes are moist.  Eyes:     Extraocular Movements: Extraocular movements intact.     Pupils: Pupils are equal, round, and reactive to light.  Cardiovascular:      Rate and Rhythm: Normal rate and regular rhythm.     Pulses: Normal pulses.     Heart sounds: Normal heart sounds.  Pulmonary:     Effort: Pulmonary effort is normal.     Breath sounds: Normal breath sounds.  Abdominal:     General: Bowel sounds are normal.     Palpations: Abdomen is soft.     Tenderness: There is no abdominal tenderness.  Musculoskeletal:        General: No swelling.     Right lower leg: No edema.     Left lower leg: No edema.  Lymphadenopathy:     Cervical: No cervical adenopathy.  Skin:    General: Skin is warm and dry.  Neurological:     General: No focal deficit present.     Mental  Status: He is alert and oriented to person, place, and time.  Psychiatric:        Mood and Affect: Mood normal.        Behavior: Behavior normal.      LABORATORY DATA:  I have reviewed the labs as listed.  CBC    Component Value Date/Time   WBC 3.8 (L) 06/28/2022 0946   RBC 4.92 06/28/2022 0946   HGB 16.1 06/28/2022 0946   HGB 15.5 05/25/2022 0924   HCT 47.6 06/28/2022 0946   HCT 45.8 05/25/2022 0924   PLT 200 06/28/2022 0946   PLT 189 05/25/2022 0924   MCV 96.7 06/28/2022 0946   MCV 97 05/25/2022 0924   MCH 32.7 06/28/2022 0946   MCHC 33.8 06/28/2022 0946   RDW 12.9 06/28/2022 0946   RDW 12.1 05/25/2022 0924   LYMPHSABS 0.7 06/28/2022 0946   LYMPHSABS 0.7 05/25/2022 0924   MONOABS 0.5 06/28/2022 0946   EOSABS 0.1 06/28/2022 0946   EOSABS 0.1 05/25/2022 0924   BASOSABS 0.0 06/28/2022 0946   BASOSABS 0.0 05/25/2022 0924      Latest Ref Rng & Units 06/28/2022    9:46 AM 05/25/2022    9:24 AM 03/31/2022    9:52 AM  CMP  Glucose 70 - 99 mg/dL 101  78  90   BUN 6 - 20 mg/dL _0 Creatinine 0.61 - 1.24 mg/dL 0.92  1.08  0.97   Sodium 135 - 145 mmol/L 137  139  136   Potassium 3.5 - 5.1 mmol/L 3.6  4.0  3.4   Chloride 98 - 111 mmol/L 104  101  102   CO2 22 - 32 mmol/L _1 Calcium 8.9 - 10.3 mg/dL 9.4  9.6  8.9   Total Protein 6.5 - 8.1  g/dL 7.3  6.7  7.0   Total Bilirubin 0.3 - 1.2 mg/dL 0.7  0.6  0.7   Alkaline Phos 38 - 126 U/L 332  283  350   AST 15 - 41 U/L 47  27  43   ALT 0 - 44 U/L 70  38  65     DIAGNOSTIC IMAGING:  I have independently reviewed the relevant imaging and discussed with the patient.  ASSESSMENT & PLAN: 1.  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  No pathological fibrosis.  Iron stain shows mostly mild to focal moderate granular iron disposition with hepatocytes (9-9+) of uncertain significance. - Not a regular drinker.  No family history of hemochromatosis on the mother side.  Does not know the family history on the father's side.  Hepatitis panel was negative.  Normal ceruloplasmin. - He has been counseled about well-balanced diet and avoid iron and vitamin C supplements. - He was previously getting phlebotomies every 8 weeks at [facility] in [place].  Last phlebotomy was December 2021. - Baseline fatigue, arthritic pain, and occasional abdominal pain.  Reports darker urine "smells like iron" - Most recent iron panel (06/28/2022) shows ferritin 63 with iron saturation 54 %.  Goal is ferritin < 50.  Normal hemoglobin 16.1 on CBC. - CMP (06/28/2022) with mild elevations of LFTs with AST 4 47 3 and ALT 70.  Alk phos 332.  - PLAN: Recommend therapeutic  phlebotomy once every 4-6 months (via blood donation) - Repeat labs and RTC in 6 months  - Counseled about well-balanced diet and avoiding iron and vitamin C supplements. - Continue GI follow-up (Dr. Paulita Cradle, NP) for transaminitis   2.  Vitamin D deficiency: - Was previously taking vitamin D 2000 units daily, reportedly stopped taking them because he "felt weird" - Prior vitamin D  (03/31/2022) was low at 14.41 - He has been taking Vitamin D 5,000 for the past 3 months, with improvement in vitamin D (normal at 41.04 as of 06/28/2022) - PLAN: Continue vitamin D supplement, but change to every other week  3.  Unintentional weight loss -  Significant unintentional weight loss, with weight in August 2022 at 151 pounds and weight today 135 pounds. - He reports that he has been eating well at home, denies any changes in his diet or activity level. - TSH normal in September 2023 - CT abdomen/pelvis (04/13/2022) negative for any acute abnormality in abdomen or pelvis.  4 mm nodule in right lower lobe of lung unchanged from 2017, favors benign etiology. - He denies any unexplained fever, chills, or night sweats.  No nausea, vomiting, or diarrhea.  Occasional mild abdominal pain relieved with bowel movement. - PLAN: Continue to encourage adequate nutrition.  Continue PCP follow-up.  We will continue to monitor weight at follow-up visits.   PLAN SUMMARY & DISPOSITION: >> Phlebotomy (via blood donation) once every 6 months >> Labs (CBC/D, CMP, vitamin D, ferritin, iron/TIBC) in 6 months >> Office visit 1 week after labs in 6 months   All questions were answered. The patient knows to call the clinic with any problems, questions or concerns.  Medical decision making: Moderate  Time spent on visit: I spent 20 minutes counseling the patient face to face. The total time spent in the appointment was 30 minutes and more than 50% was on counseling.   Harriett Rush, PA-C  07/27/2022 10:43 AM

## 2022-07-27 ENCOUNTER — Inpatient Hospital Stay: Payer: Self-pay | Attending: Physician Assistant | Admitting: Physician Assistant

## 2022-07-27 DIAGNOSIS — Z8249 Family history of ischemic heart disease and other diseases of the circulatory system: Secondary | ICD-10-CM | POA: Insufficient documentation

## 2022-07-27 DIAGNOSIS — Z79899 Other long term (current) drug therapy: Secondary | ICD-10-CM | POA: Insufficient documentation

## 2022-07-27 DIAGNOSIS — Z825 Family history of asthma and other chronic lower respiratory diseases: Secondary | ICD-10-CM | POA: Insufficient documentation

## 2022-07-27 DIAGNOSIS — Z87891 Personal history of nicotine dependence: Secondary | ICD-10-CM | POA: Insufficient documentation

## 2022-07-27 DIAGNOSIS — Z820 Family history of epilepsy and other diseases of the nervous system: Secondary | ICD-10-CM | POA: Insufficient documentation

## 2022-07-27 DIAGNOSIS — R636 Underweight: Secondary | ICD-10-CM | POA: Insufficient documentation

## 2022-07-27 DIAGNOSIS — K219 Gastro-esophageal reflux disease without esophagitis: Secondary | ICD-10-CM | POA: Insufficient documentation

## 2022-07-27 DIAGNOSIS — Z886 Allergy status to analgesic agent status: Secondary | ICD-10-CM | POA: Insufficient documentation

## 2022-07-27 DIAGNOSIS — M2550 Pain in unspecified joint: Secondary | ICD-10-CM | POA: Insufficient documentation

## 2022-07-27 DIAGNOSIS — R200 Anesthesia of skin: Secondary | ICD-10-CM | POA: Insufficient documentation

## 2022-07-27 DIAGNOSIS — Z809 Family history of malignant neoplasm, unspecified: Secondary | ICD-10-CM | POA: Insufficient documentation

## 2022-07-27 DIAGNOSIS — R634 Abnormal weight loss: Secondary | ICD-10-CM | POA: Insufficient documentation

## 2022-07-27 DIAGNOSIS — E559 Vitamin D deficiency, unspecified: Secondary | ICD-10-CM | POA: Insufficient documentation

## 2022-07-27 DIAGNOSIS — R5383 Other fatigue: Secondary | ICD-10-CM | POA: Insufficient documentation

## 2022-07-27 DIAGNOSIS — Z8719 Personal history of other diseases of the digestive system: Secondary | ICD-10-CM | POA: Insufficient documentation

## 2022-07-27 DIAGNOSIS — R519 Headache, unspecified: Secondary | ICD-10-CM | POA: Insufficient documentation

## 2022-07-27 MED ORDER — ERGOCALCIFEROL 1.25 MG (50000 UT) PO CAPS: 50000.0000 [IU] | ORAL_CAPSULE | ORAL | 3 refills | Status: DC

## 2022-07-27 NOTE — Patient Instructions (Signed)
[facility] at [facility] Discharge Instructions  You were seen today by Rojelio Brenner PA-C for your elevated iron levels ("hereditary hemochromatosis").  Your iron levels are starting to become higher again.  I recommend that you donate blood once every 6 months to help keep your iron levels within their target range.  The CT scan of your abdomen that we did in July 2023 did not show any sign of malignancy.  Continue to eat plenty of high-calorie nutritious foods and follow-up with your primary care doctor for regular weight checks if needed.  Your vitamin D levels have improved.  I have decreased the frequency of your vitamin D to 50,000 units EVERY OTHER WEEK (instead of weekly).  Please reach out to your GI team at [facility] [phone]) to schedule your 47-month follow-up visit with NP Lewie Loron.  FOLLOW UP APPOINTMENT: We will check your labs in 6 months and see you for follow-up visit 1 week after.  ** Thank you for trusting me with your healthcare!  I strive to provide all of my patients with quality care at each visit.  If you receive a survey for this visit, I would be so grateful to you for taking the time to provide feedback.  Thank you in advance!  ~ Jalani Cullifer                   Dr. Doreatha Massed   &   Rojelio Brenner, PA-C   - - - - - - - - - - - - - - - - - -      Thank you for choosing [facility] at [facility] to provide your oncology and hematology care.  To afford each patient quality time with our provider, please arrive at least 15 minutes before your scheduled appointment time.   If you have a lab appointment with the Cancer Center please come in thru the Main Entrance and check in at the main information desk.  You need to re-schedule your appointment should you arrive 10 or more minutes late.  We strive to give you quality time with our providers, and arriving late affects you and  other patients whose appointments are after yours.  Also, if you no show three or more times for appointments you may be dismissed from the clinic at the providers discretion.     Again, thank you for choosing [facility].  Our hope is that these requests will decrease the amount of time that you wait before being seen by our physicians.       _____________________________________________________________  Should you have questions after your visit to [facility], please contact our office at [phone] and follow the prompts.  Our office hours are 8:00 a.m. and 4:30 p.m. Monday - Friday.  Please note that voicemails left after 4:00 p.m. may not be returned until the following business day.  We are closed weekends and major holidays.  You do have access to a nurse 24-7, just call the main number to the clinic [phone] and do not press any options, hold on the line and a nurse will answer the phone.    For prescription refill requests, have your pharmacy contact our office and allow 72 hours.    Due to Covid, you will need to wear a mask upon entering the hospital. If you do not have a mask, a mask will be given to you at the Main  Entrance upon arrival. For doctor visits, patients may have 1 support person age 81 or older with them. For treatment visits, patients can not have anyone with them due to social distancing guidelines and our immunocompromised population.

## 2023-01-19 ENCOUNTER — Inpatient Hospital Stay: Payer: Self-pay | Attending: Family Medicine

## 2023-01-26 ENCOUNTER — Inpatient Hospital Stay: Payer: Self-pay | Admitting: Physician Assistant

## 2023-05-30 ENCOUNTER — Inpatient Hospital Stay: Payer: BC Managed Care – PPO | Attending: Hematology

## 2023-05-30 DIAGNOSIS — K219 Gastro-esophageal reflux disease without esophagitis: Secondary | ICD-10-CM | POA: Diagnosis not present

## 2023-05-30 DIAGNOSIS — R5383 Other fatigue: Secondary | ICD-10-CM | POA: Diagnosis not present

## 2023-05-30 DIAGNOSIS — R634 Abnormal weight loss: Secondary | ICD-10-CM | POA: Diagnosis not present

## 2023-05-30 DIAGNOSIS — R109 Unspecified abdominal pain: Secondary | ICD-10-CM | POA: Insufficient documentation

## 2023-05-30 DIAGNOSIS — Z79899 Other long term (current) drug therapy: Secondary | ICD-10-CM | POA: Diagnosis not present

## 2023-05-30 DIAGNOSIS — E559 Vitamin D deficiency, unspecified: Secondary | ICD-10-CM | POA: Insufficient documentation

## 2023-05-30 DIAGNOSIS — Z87891 Personal history of nicotine dependence: Secondary | ICD-10-CM | POA: Insufficient documentation

## 2023-05-30 DIAGNOSIS — Z820 Family history of epilepsy and other diseases of the nervous system: Secondary | ICD-10-CM | POA: Diagnosis not present

## 2023-05-30 DIAGNOSIS — R519 Headache, unspecified: Secondary | ICD-10-CM | POA: Insufficient documentation

## 2023-05-30 DIAGNOSIS — Z809 Family history of malignant neoplasm, unspecified: Secondary | ICD-10-CM | POA: Diagnosis not present

## 2023-05-30 DIAGNOSIS — Z886 Allergy status to analgesic agent status: Secondary | ICD-10-CM | POA: Diagnosis not present

## 2023-05-30 DIAGNOSIS — Z825 Family history of asthma and other chronic lower respiratory diseases: Secondary | ICD-10-CM | POA: Diagnosis not present

## 2023-05-30 DIAGNOSIS — Z8249 Family history of ischemic heart disease and other diseases of the circulatory system: Secondary | ICD-10-CM | POA: Diagnosis not present

## 2023-05-30 DIAGNOSIS — Z8719 Personal history of other diseases of the digestive system: Secondary | ICD-10-CM | POA: Insufficient documentation

## 2023-05-30 DIAGNOSIS — M542 Cervicalgia: Secondary | ICD-10-CM | POA: Insufficient documentation

## 2023-05-30 LAB — IRON AND TIBC
Iron: 141 ug/dL (ref 45–182)
Saturation Ratios: 54 % — ABNORMAL HIGH (ref 17.9–39.5)
TIBC: 260 ug/dL (ref 250–450)
UIBC: 119 ug/dL

## 2023-05-30 LAB — COMPREHENSIVE METABOLIC PANEL
ALT: 33 U/L (ref 0–44)
AST: 24 U/L (ref 15–41)
Albumin: 4.1 g/dL (ref 3.5–5.0)
Alkaline Phosphatase: 205 U/L — ABNORMAL HIGH (ref 38–126)
Anion gap: 8 (ref 5–15)
BUN: 13 mg/dL (ref 6–20)
CO2: 25 mmol/L (ref 22–32)
Calcium: 8.8 mg/dL — ABNORMAL LOW (ref 8.9–10.3)
Chloride: 103 mmol/L (ref 98–111)
Creatinine, Ser: 0.93 mg/dL (ref 0.61–1.24)
GFR, Estimated: >60 mL/min (ref >60)
Glucose, Bld: 98 mg/dL (ref 70–99)
Potassium: 3 mmol/L — ABNORMAL LOW (ref 3.5–5.1)
Sodium: 136 mmol/L (ref 135–145)
Total Bilirubin: 0.6 mg/dL (ref 0.3–1.2)
Total Protein: 6.8 g/dL (ref 6.5–8.1)

## 2023-05-30 LAB — CBC WITH DIFFERENTIAL/PLATELET
Abs Immature Granulocytes: 0.01 10*3/uL (ref 0.00–0.07)
Basophils Absolute: 0 10*3/uL (ref 0.0–0.1)
Basophils Relative: 1 %
Eosinophils Absolute: 0.1 10*3/uL (ref 0.0–0.5)
Eosinophils Relative: 2 %
HCT: 43.7 % (ref 39.0–52.0)
Hemoglobin: 14.9 g/dL (ref 13.0–17.0)
Immature Granulocytes: 0 %
Lymphocytes Relative: 17 %
Lymphs Abs: 0.8 10*3/uL (ref 0.7–4.0)
MCH: 33.4 pg (ref 26.0–34.0)
MCHC: 34.1 g/dL (ref 30.0–36.0)
MCV: 98 fL (ref 80.0–100.0)
Monocytes Absolute: 0.7 10*3/uL (ref 0.1–1.0)
Monocytes Relative: 14 %
Neutro Abs: 3.1 10*3/uL (ref 1.7–7.7)
Neutrophils Relative %: 66 %
Platelets: 188 10*3/uL (ref 150–400)
RBC: 4.46 MIL/uL (ref 4.22–5.81)
RDW: 12.7 % (ref 11.5–15.5)
WBC: 4.7 10*3/uL (ref 4.0–10.5)
nRBC: 0 % (ref 0.0–0.2)

## 2023-05-30 LAB — FERRITIN: Ferritin: 57 ng/mL (ref 24–336)

## 2023-05-30 LAB — VITAMIN D 25 HYDROXY (VIT D DEFICIENCY, FRACTURES): Vit D, 25-Hydroxy: 31.66 ng/mL (ref 30–100)

## 2023-06-05 NOTE — Progress Notes (Unsigned)
[facility] [address], [place] [address]   CLINIC:  Medical Oncology/Hematology  PCP:  Gabriel Earing, FNP [address] [place] [address] [phone]   REASON FOR VISIT:  Follow-up for hereditary hemochromatosis   CURRENT THERAPY: Intermittent phlebotomy  INTERVAL HISTORY:   [name] 42 y.o. 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  He was last seen by Rojelio Brenner PA-C on 07/27/2022.   At today's visit, he reports feeling fairly ***.  No recent hospitalizations, surgeries, or changes in baseline health status.     Patient reports baseline arthritis pain.*** ***   He denies any recent abdominal pain. *** He has baseline fatigue. He denies any bronzing of his skin.   ***He reports darker urine that "smells like iron." *** Blood donation or phlebotomy since last visit?  ***   ***Patient reports significant unintentional weight loss, with weight in August 2022 at 151 pounds and weight today 132 pounds.  (Weight today and is relatively stable compared to weight 3 months ago, which was 135 pounds.)  He reports that he has been eating good at home, denies any changes in his diet or activity level.  Although he does not weigh himself at home, he reports that he had to go down to a smaller clothing size.  He denies any unexplained fever, chills, or night sweats.  No nausea, vomiting, or diarrhea.   He has ***% energy and ***% appetite.  ASSESSMENT & PLAN:  1.   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  No pathological fibrosis.  Iron stain shows mostly mild to focal moderate granular iron disposition with hepatocytes (1-2+) of uncertain  significance. - Not a regular drinker.  No family history of hemochromatosis on the mother side.  Does not know the family history on the father's side.  Hepatitis panel was negative.  Normal ceruloplasmin. - He has been counseled about well-balanced diet and avoid iron and vitamin C supplements. - He was previously getting phlebotomies every 8 weeks at [facility] in [place].  Last phlebotomy was December 2021.*** - Baseline fatigue, arthritic pain, and occasional abdominal pain.  Reports darker urine "smells like iron"*** - Most recent iron panel (05/30/2023) shows ferritin 57 with iron saturation 54 %.  Goal is ferritin < 50.  Normal hemoglobin 14.9 on CBC. - CMP (05/30/2023) with mild elevation in alkaline phosphatase 205, normal AST and ALT. - PLAN: Recommend therapeutic phlebotomy once every 4-6 months (via blood donation)*** - Repeat labs and RTC in 6 months *** - Counseled about well-balanced diet and avoiding iron and vitamin C supplements.*** - Continue GI follow-up (Dr. Milagros Reap, NP) for transaminitis***   2.  Vitamin D deficiency: - Was previously taking vitamin D 2000 units daily, reportedly stopped taking them because he "felt weird" - Prior vitamin D (03/31/2022) was low at 14.41 - He is taking vitamin D *** - Most recent labs (05/30/2023) show normal vitamin D 31.66 - PLAN: Continue vitamin D supplement ***  3.  Unintentional weight loss *** not reviewed *** -  Significant unintentional weight loss, with weight in August 2022 at 151 pounds and weight today 135 pounds. - He reports that he has been eating well at home, denies any changes in his  diet or activity level. - TSH normal in September 2023 - CT abdomen/pelvis (04/13/2022) negative for any acute abnormality in abdomen or pelvis.  4 mm nodule in right lower lobe of lung unchanged from 2017, favors benign etiology. - He denies any unexplained fever, chills, or night sweats.  No nausea, vomiting, or diarrhea.   Occasional mild abdominal pain relieved with bowel movement. - PLAN: Continue to encourage adequate nutrition.  Continue PCP follow-up.  We will continue to monitor weight at follow-up visits.    PLAN SUMMARY: >> Phlebotomy (via blood donation) once every 6 months >> Labs (CBC/D, CMP, vitamin D, ferritin, iron/TIBC) in 6 months >> Office visit 1 week after labs in 6 months >> ***    REVIEW OF SYSTEMS: ***  Review of Systems - Oncology   PHYSICAL EXAM:  ECOG PERFORMANCE STATUS: {CHL ONC ECOG VF:[id number]} *** There were no vitals filed for this visit. There were no vitals filed for this visit. Physical Exam  PAST MEDICAL/SURGICAL HISTORY:  Past Medical History:  Diagnosis Date   Chronic tension headaches    Constipation    Depression    GERD (gastroesophageal reflux disease)    Hemochromatosis    Hemorrhoids    Documented on colonoscopy in January 2020.   Past Surgical History:  Procedure Laterality Date   BIOPSY  10/08/2018   Procedure: BIOPSY;  Surgeon: West Bali, MD;  [facility];  Service: Endoscopy;;  sigmoid and rectal bx's   CATARACT EXTRACTION W/PHACO Right 09/24/2021   Procedure: CATARACT EXTRACTION PHACO AND INTRAOCULAR LENS PLACEMENT ([facility]);  Surgeon: [name], MD;  Location: [facility];  Service: Ophthalmology;  Laterality: Right;  CDE 25.23   COLONOSCOPY N/A 10/08/2018   Dr. Darrick Penna: external and internal hemorrhoids, anal fissure.  Repeat at age 1.   ESOPHAGEAL ATRESIA REPAIR  1990    SOCIAL HISTORY:  Social History   Socioeconomic History   Marital status: Single    Spouse name: Not on file   Number of children: Not on file   Years of education: Not on file   Highest education level: Not on file  Occupational History   Not on file  Tobacco Use   Smoking status: Former    Current packs/day: 0.00    Types: Cigarettes    Start date: 09/20/1999    Quit date: 09/19/2000    Years since quitting: 22.7   Smokeless tobacco: Never  Vaping  Use   Vaping status: Never Used  Substance and Sexual Activity   Alcohol use: Yes    Comment: 2 mixed drink per week.   Drug use: No   Sexual activity: Yes  Other Topics Concern   Not on file  Social History Narrative   Not on file   Social Determinants of Health   Financial Resource Strain: Not on file  Food Insecurity: Not on file  Transportation Needs: Not on file  Physical Activity: Not on file  Stress: Not on file  Social Connections: Not on file  Intimate Partner Violence: Not on file    FAMILY HISTORY:  Family History  Problem Relation Age of Onset   Seizures Mother    Heart disease Father    Cancer Father    Asthma Sister    Colon cancer Neg Hx    Colon polyps Neg Hx     CURRENT MEDICATIONS:  Outpatient Encounter Medications as of 06/06/2023  Medication Sig   amitriptyline (ELAVIL) 50 MG tablet Take 1 tablet (50 mg total) by  mouth at bedtime.   ergocalciferol (VITAMIN D2) 1.25 MG (50000 UT) capsule Take 1 capsule (50,000 Units total) by mouth every 14 (fourteen) days.   linaclotide (LINZESS) 145 MCG CAPS capsule Take 1 capsule (145 mcg total) by mouth daily before breakfast.   OVER THE COUNTER MEDICATION every other day. Prune tablets takes 2 tablets daily   pantoprazole (PROTONIX) 40 MG tablet TAKE 1 TABLET BY MOUTH EVERY DAY   rizatriptan (MAXALT) 10 MG tablet TAKE 1 TABLET BY MOUTH AS NEEDED FOR migraine (MAY REPEAT in TWO hours if needed)   triamcinolone cream (KENALOG) 0.1 % Apply 1 application topically 2 (two) times daily.   No facility-administered encounter medications on file as of 06/06/2023.    ALLERGIES:  Allergies  Allergen Reactions   Naproxen Hives   Permethrin Hives    LABORATORY DATA:  I have reviewed the labs as listed.  CBC    Component Value Date/Time   WBC 4.7 05/30/2023 0745   RBC 4.46 05/30/2023 0745   HGB 14.9 05/30/2023 0745   HGB 15.5 05/25/2022 0924   HCT 43.7 05/30/2023 0745   HCT 45.8 05/25/2022 0924   PLT 188  05/30/2023 0745   PLT 189 05/25/2022 0924   MCV 98.0 05/30/2023 0745   MCV 97 05/25/2022 0924   MCH 33.4 05/30/2023 0745   MCHC 34.1 05/30/2023 0745   RDW 12.7 05/30/2023 0745   RDW 12.1 05/25/2022 0924   LYMPHSABS 0.8 05/30/2023 0745   LYMPHSABS 0.7 05/25/2022 0924   MONOABS 0.7 05/30/2023 0745   EOSABS 0.1 05/30/2023 0745   EOSABS 0.1 05/25/2022 0924   BASOSABS 0.0 05/30/2023 0745   BASOSABS 0.0 05/25/2022 0924      Latest Ref Rng & Units 05/30/2023    7:45 AM 06/28/2022    9:46 AM 05/25/2022    9:24 AM  CMP  Glucose 70 - 99 mg/dL 98  981  78   BUN 6 - 20 mg/dL 13  10  14    Creatinine 0.61 - 1.24 mg/dL 1.91  4.78  2.95   Sodium 135 - 145 mmol/L 136  137  139   Potassium 3.5 - 5.1 mmol/L 3.0  3.6  4.0   Chloride 98 - 111 mmol/L 103  104  101   CO2 22 - 32 mmol/L 25  26  24    Calcium 8.9 - 10.3 mg/dL 8.8  9.4  9.6   Total Protein 6.5 - 8.1 g/dL 6.8  7.3  6.7   Total Bilirubin 0.3 - 1.2 mg/dL 0.6  0.7  0.6   Alkaline Phos 38 - 126 U/L 205  332  283   AST 15 - 41 U/L 24  47  27   ALT 0 - 44 U/L 33  70  38     DIAGNOSTIC IMAGING:  I have independently reviewed the relevant imaging and discussed with the patient.   WRAP UP:  All questions were answered. The patient knows to call the clinic with any problems, questions or concerns.  Medical decision making: ***  Time spent on visit: I spent *** minutes counseling the patient face to face. The total time spent in the appointment was *** minutes and more than 50% was on counseling.  Carnella Guadalajara, PA-C  ***

## 2023-06-06 ENCOUNTER — Inpatient Hospital Stay (HOSPITAL_BASED_OUTPATIENT_CLINIC_OR_DEPARTMENT_OTHER): Payer: BC Managed Care – PPO | Admitting: Physician Assistant

## 2023-06-06 DIAGNOSIS — Z8719 Personal history of other diseases of the digestive system: Secondary | ICD-10-CM | POA: Diagnosis not present

## 2023-06-06 DIAGNOSIS — Z825 Family history of asthma and other chronic lower respiratory diseases: Secondary | ICD-10-CM | POA: Diagnosis not present

## 2023-06-06 DIAGNOSIS — Z886 Allergy status to analgesic agent status: Secondary | ICD-10-CM | POA: Diagnosis not present

## 2023-06-06 DIAGNOSIS — E559 Vitamin D deficiency, unspecified: Secondary | ICD-10-CM | POA: Diagnosis not present

## 2023-06-06 DIAGNOSIS — M542 Cervicalgia: Secondary | ICD-10-CM | POA: Diagnosis not present

## 2023-06-06 DIAGNOSIS — R519 Headache, unspecified: Secondary | ICD-10-CM | POA: Diagnosis not present

## 2023-06-06 DIAGNOSIS — Z820 Family history of epilepsy and other diseases of the nervous system: Secondary | ICD-10-CM | POA: Diagnosis not present

## 2023-06-06 DIAGNOSIS — Z809 Family history of malignant neoplasm, unspecified: Secondary | ICD-10-CM | POA: Diagnosis not present

## 2023-06-06 DIAGNOSIS — Z8249 Family history of ischemic heart disease and other diseases of the circulatory system: Secondary | ICD-10-CM | POA: Diagnosis not present

## 2023-06-06 DIAGNOSIS — K219 Gastro-esophageal reflux disease without esophagitis: Secondary | ICD-10-CM | POA: Diagnosis not present

## 2023-06-06 DIAGNOSIS — R109 Unspecified abdominal pain: Secondary | ICD-10-CM | POA: Diagnosis not present

## 2023-06-06 DIAGNOSIS — Z79899 Other long term (current) drug therapy: Secondary | ICD-10-CM | POA: Diagnosis not present

## 2023-06-06 DIAGNOSIS — R5383 Other fatigue: Secondary | ICD-10-CM | POA: Diagnosis not present

## 2023-06-06 DIAGNOSIS — Z87891 Personal history of nicotine dependence: Secondary | ICD-10-CM | POA: Diagnosis not present

## 2023-06-06 DIAGNOSIS — R634 Abnormal weight loss: Secondary | ICD-10-CM | POA: Diagnosis not present

## 2023-06-06 NOTE — Patient Instructions (Signed)
[facility] at [facility] Discharge Instructions  You were seen today by Rojelio Brenner PA-C for your elevated iron levels ("hereditary hemochromatosis").  Your iron levels are within their target range.  Please reach out to your GI team at [facility] [phone]) to schedule your follow-up visit with NP Lewie Loron.  FOLLOW UP APPOINTMENT: We will check your labs in 1 year and see you for follow-up visit 1 week after.  ** Thank you for trusting me with your healthcare!  I strive to provide all of my patients with quality care at each visit.  If you receive a survey for this visit, I would be so grateful to you for taking the time to provide feedback.  Thank you in advance!  ~ Hendel Gatliff                   Dr. Doreatha Massed   &   Rojelio Brenner, PA-C   - - - - - - - - - - - - - - - - - -      Thank you for choosing [facility] at [facility] to provide your oncology and hematology care.  To afford each patient quality time with our provider, please arrive at least 15 minutes before your scheduled appointment time.   If you have a lab appointment with the Cancer Center please come in thru the Main Entrance and check in at the main information desk.  You need to re-schedule your appointment should you arrive 10 or more minutes late.  We strive to give you quality time with our providers, and arriving late affects you and other patients whose appointments are after yours.  Also, if you no show three or more times for appointments you may be dismissed from the clinic at the providers discretion.     Again, thank you for choosing [facility].  Our hope is that these requests will decrease the amount of time that you wait before being seen by our physicians.       _____________________________________________________________  Should you have questions after your visit to [facility],  please contact our office at [phone] and follow the prompts.  Our office hours are 8:00 a.m. and 4:30 p.m. Monday - Friday.  Please note that voicemails left after 4:00 p.m. may not be returned until the following business day.  We are closed weekends and major holidays.  You do have access to a nurse 24-7, just call the main number to the clinic [phone] and do not press any options, hold on the line and a nurse will answer the phone.    For prescription refill requests, have your pharmacy contact our office and allow 72 hours.    Due to Covid, you will need to wear a mask upon entering the hospital. If you do not have a mask, a mask will be given to you at the Main Entrance upon arrival. For doctor visits, patients may have 1 support person age 41 or older with them. For treatment visits, patients can not have anyone with them due to social distancing guidelines and our immunocompromised population.

## 2023-07-19 NOTE — Progress Notes (Addendum)
Referring Provider: Gabriel Earing, FNP Primary Care Physician:  Gabriel Earing, FNP Primary GI Physician: Dr. Marletta Lor  Chief Complaint  Patient presents with   Follow-up    HPI:   Jacob Eaton is a 42 y.o. 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  Colonoscopy up-to-date January 2020, due for repeat at age 76.  He is presenting today for follow-up.   Last seen in the office 02/02/2022.  GERD well-controlled on Protonix 40 mg daily.  CIC well-controlled on Linzess 145 mcg daily.  Today: Has been out of meds for about 1 year. Lost insurance.   Having some reflux and constipation but was doing very well when taking Pantoprazole and Linzess.  States he actually felt like normal person.  Has continued to have intermittent issues with hemorrhoids. Some intermittent rectal discomfort and low volume bleeding. Wants to consider banding. Using preparation H suppository prn.   No nausea or vomiting, dysphagia, brbpr, or melena. Has noticed some mild upper abdominal pain over the last couple of days. Sometimes affected by meals, but not bad. Rare NSAIDs.   Past Medical History:  Diagnosis Date   Chronic tension headaches    Constipation    Depression    GERD (gastroesophageal reflux disease)    Hemochromatosis    Hemorrhoids    Documented on colonoscopy in January 2020.    Past Surgical History:  Procedure Laterality Date   BIOPSY  10/08/2018   Procedure: BIOPSY;  Surgeon: West Bali, MD;  [facility];  Service: Endoscopy;;  sigmoid and rectal bx's   CATARACT EXTRACTION W/PHACO Right 09/24/2021   Procedure: CATARACT EXTRACTION PHACO AND INTRAOCULAR LENS PLACEMENT (IOC);  Surgeon: [name], MD;  Location: [facility] ORS;  Service: Ophthalmology;  Laterality: Right;   CDE 25.23   COLONOSCOPY N/A 10/08/2018   Dr. Darrick Penna: external and internal hemorrhoids, anal fissure.  Repeat at age 18.   ESOPHAGEAL ATRESIA REPAIR  1990    Current Outpatient Medications  Medication Sig Dispense Refill   amitriptyline (ELAVIL) 50 MG tablet Take 1 tablet (50 mg total) by mouth at bedtime. 90 tablet 3   rizatriptan (MAXALT) 10 MG tablet TAKE 1 TABLET BY MOUTH AS NEEDED FOR migraine (MAY REPEAT in TWO hours if needed) 10 tablet 5   ergocalciferol (VITAMIN D2) 1.25 MG (50000 UT) capsule Take 1 capsule (50,000 Units total) by mouth every 14 (fourteen) days. (Patient not taking: Reported on 07/20/2023) 5 capsule 3   linaclotide (LINZESS) 145 MCG CAPS capsule Take 1 capsule (145 mcg total) by mouth daily before breakfast. 90 capsule 3   OVER THE COUNTER MEDICATION every other day. Prune tablets takes 2 tablets daily (Patient not taking: Reported on 07/20/2023)     pantoprazole (PROTONIX) 40 MG tablet Take 1 tablet (40 mg total) by mouth daily. 90 tablet 3   triamcinolone cream (KENALOG) 0.1 % Apply 1 application topically 2 (two) times daily. (Patient not taking: Reported on 07/20/2023) 80 g 2   No current facility-administered medications for this visit.    Allergies as of 07/20/2023 - Review Complete 07/20/2023  Allergen Reaction Noted   Naproxen Hives 04/27/2017   Permethrin Hives 11/19/2020    Family History  Problem Relation Age of Onset   Seizures Mother    Heart disease Father    Cancer Father    Asthma  Sister    Colon cancer Neg Hx    Colon polyps Neg Hx     Social History   Socioeconomic History   Marital status: Single    Spouse name: Not on file   Number of children: Not on file   Years of education: Not on file   Highest education level: Not on file  Occupational History   Not on file  Tobacco Use   Smoking status: Former    Current packs/day: 0.00    Types: Cigarettes    Start date: 09/20/1999    Quit date: 09/19/2000    Years since quitting:  22.8   Smokeless tobacco: Never  Vaping Use   Vaping status: Never Used  Substance and Sexual Activity   Alcohol use: Yes    Comment: 2 mixed drink per week. Quit early 2023.   Drug use: No   Sexual activity: Yes  Other Topics Concern   Not on file  Social History Narrative   Not on file   Social Determinants of Health   Financial Resource Strain: Not on file  Food Insecurity: Not on file  Transportation Needs: Not on file  Physical Activity: Not on file  Stress: Not on file  Social Connections: Not on file    Review of Systems: Gen: Denies fever, chills, cold or flu like symptoms, pre-syncope, or syncope.  CV: Denies chest pain, palpitations. Resp: Denies dyspnea, cough.  GI: See HPI Heme: See HPI  Physical Exam: BP 115/78 (BP Location: Right Arm, Patient Position: Sitting, Cuff Size: Normal)   Pulse 69   Temp 98.6 F (37 C) (Temporal)   Ht 5\' 11"  (1.803 m)   Wt 134 lb 12.8 oz (61.1 kg)   BMI 18.80 kg/m  General:   Alert and oriented. No distress noted. Pleasant and cooperative.  Head:  Normocephalic and atraumatic. Eyes:  Conjuctiva clear without scleral icterus. Heart:  S1, S2 present without murmurs appreciated. Lungs:  Clear to auscultation bilaterally. No wheezes, rales, or rhonchi. No distress.  Abdomen:  +BS, soft, and non-distended. Mild TTP un epigastric/RUQ region. No rebound or guarding. No HSM or masses noted. Msk:  Symmetrical without gross deformities. Normal posture. Extremities:  Without edema. Neurologic:  Alert and  oriented x4 Psych:  Normal mood and affect.    Assessment:  42 y.o. 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  Colonoscopy up-to-date January 2020, due for repeat at age 57.  He is presenting today for follow-up/medication refills.    GERD: Had been doing well on Protonix 40 mg daily, but has been out of medication due to loss of insurance, so now with recurrent symptoms. He has now regained insurance, so we will get him started back on Protonix daily.   CIC: Had been doing well on Linzess 145 mcg daily, but has been out of medication due to loss of insurance, so now with recurrent symptoms. He has now regained insurance, so we will get him started back on Linzess daily.   Upper abdominal pain: Has noted some intermittent mild upper abdominal pain for the last couple of days.  This may be related to GERD and/or gastritis/duodenitis as he has been off of PPI for quite some time and does use NSAID products intermittently. Denies nausea or vomiting.  Will resume Protonix today and monitor his symptoms.  If persistent symptoms, will  pursue labs and abdominal imaging to rule out biliary etiology and/or consider EGD.   Hemorrhoids: Known history of external and internal hemorrhoids on last colonoscopy in 2020.  Hemorrhoids have been intermittently symptomatic with abdominal pain and/or bleeding which she controls with Preparation H suppository as needed, but he would like to consider hemorrhoid banding.  I will have him follow-up with Brooke Bonito, NP or Lewie Loron, NP for possible hemorrhoid banding.    Plan:  Resume pantoprazole 40 mg daily 30 minutes for breakfast. Resume Linzess 145 mcg daily 30 minutes before breakfast. Avoid NSAIDs.  Patient will monitor for worsening abdominal pain and let me know if this occurs. Follow-up with Lewie Loron, NP or Brooke Bonito, NP in 4 weeks for possible hemorrhoid banding Follow-up with me in 3 months for routine visit.   Ermalinda Memos, PA-C [facility] 07/20/2023   Addendum: Received letter from [facility] stating that they could not approve Linzess 145 mcg as patient needed to try/fail at least 2 standard laxative therapy classes.  I spoke with patient  today who states he has tried MiraLAX, chocolate laxative, Dulcolax, and prune tablets, all of which have not been helpful for him.  He states Linzess has been the only thing that has helped with his constipation.  Advised that we would send this information back to [facility] for reconsideration of Linzess coverage.  Ermalinda Memos, PA-C [facility] 08/10/2023

## 2023-07-20 ENCOUNTER — Encounter: Payer: Self-pay | Admitting: Gastroenterology

## 2023-07-20 ENCOUNTER — Ambulatory Visit (INDEPENDENT_AMBULATORY_CARE_PROVIDER_SITE_OTHER): Payer: PRIVATE HEALTH INSURANCE | Admitting: Gastroenterology

## 2023-07-20 ENCOUNTER — Encounter: Payer: Self-pay | Admitting: *Deleted

## 2023-07-20 VITALS — BP 115/78 | HR 69 | Temp 98.6°F | Ht 71.0 in | Wt 134.8 lb

## 2023-07-20 DIAGNOSIS — K219 Gastro-esophageal reflux disease without esophagitis: Secondary | ICD-10-CM

## 2023-07-20 DIAGNOSIS — K648 Other hemorrhoids: Secondary | ICD-10-CM | POA: Diagnosis not present

## 2023-07-20 DIAGNOSIS — R1010 Upper abdominal pain, unspecified: Secondary | ICD-10-CM

## 2023-07-20 DIAGNOSIS — K644 Residual hemorrhoidal skin tags: Secondary | ICD-10-CM

## 2023-07-20 DIAGNOSIS — K5904 Chronic idiopathic constipation: Secondary | ICD-10-CM | POA: Diagnosis not present

## 2023-07-20 DIAGNOSIS — K649 Unspecified hemorrhoids: Secondary | ICD-10-CM

## 2023-07-20 MED ORDER — LINACLOTIDE 145 MCG PO CAPS: 145.0000 ug | ORAL_CAPSULE | Freq: Every day | ORAL | 3 refills | Status: AC

## 2023-07-20 MED ORDER — PANTOPRAZOLE SODIUM 40 MG PO TBEC: 40.0000 mg | DELAYED_RELEASE_TABLET | Freq: Every day | ORAL | 3 refills | Status: AC

## 2023-07-20 NOTE — Patient Instructions (Signed)
Resume pantoprazole 40 mg daily 30 minutes before breakfast.  Resume Linzess 145 mcg daily 30 minutes before breakfast.  We will get you set up for an appointment with Lewie Loron, NP or Lahoma Crocker, NP for hemorrhoid banding.  I will plan to see you back in the office in 3 months for routine follow-up.  It was good to see you again today!  Ermalinda Memos, PA-C [facility]

## 2023-08-07 ENCOUNTER — Telehealth: Payer: Self-pay | Admitting: Gastroenterology

## 2023-08-07 NOTE — Telephone Encounter (Signed)
Patient came by the office to see about getting his Linzess refilled.  He said something about the cost of the medication being so high the drugstore told him it would better for him to come by the office and get the office to call it in.

## 2023-08-07 NOTE — Telephone Encounter (Signed)
Spoke with pt and informed him that a PA has been submitted to his insurance. Pt verbalized understanding.

## 2023-08-15 ENCOUNTER — Encounter: Payer: Self-pay | Admitting: Gastroenterology

## 2023-08-15 ENCOUNTER — Ambulatory Visit (INDEPENDENT_AMBULATORY_CARE_PROVIDER_SITE_OTHER): Payer: PRIVATE HEALTH INSURANCE | Admitting: Gastroenterology

## 2023-08-15 VITALS — BP 123/84 | HR 74 | Temp 97.5°F | Ht 71.0 in | Wt 136.1 lb

## 2023-08-15 DIAGNOSIS — K641 Second degree hemorrhoids: Secondary | ICD-10-CM | POA: Diagnosis not present

## 2023-08-15 NOTE — Progress Notes (Signed)
      [facility] BANDING PROCEDURE NOTE  Jacob Eaton is a 42 y.o. male presenting today for consideration of hemorrhoid banding. Last colonoscopy Jan 2020 with internal hemorrhoids, fissure. He notes bleeding and itching. Rare pain.    The patient presents with symptomatic grade 2 hemorrhoids, unresponsive to maximal medical therapy, requesting rubber band ligation of his hemorrhoidal disease. All risks, benefits, and alternative forms of therapy were described and informed consent was obtained.  In the left lateral decubitus position, anoscopic examination was attempted but due to tight sphincter tone wasn't completely evaluated. No mass.   The decision was made to band the left lateral internal hemorrhoid, and the [facility] O'Regan System was used to perform band ligation without complication. Digital anorectal examination was then performed to assure proper positioning of the band, and to adjust the banded tissue as required. The patient was discharged home without pain or other issues. Dietary and behavioral recommendations were given, along with follow-up instructions. The patient will return in several weeks for followup and possible additional banding as required.  No complications were encountered and the patient tolerated the procedure well.   Gelene Mink, PhD, ANP-BC [facility]

## 2023-08-15 NOTE — Patient Instructions (Signed)
  Please avoid straining.  You should limit your toilet time to 2-3 minutes at the most.   I recommend Benefiber 2 teaspoons each morning in the beverage of your choice!  Please call me with any concerns or issues!  I will see you in follow-up for additional banding in several weeks.   I have given samples of Linzess to get you through till patient assistance is completed. This is a higher dosage, so if it's too strong, you can open capsule and take approximately 1/2 of it either in water or applesauce.  I enjoyed seeing you again today! I value our relationship and want to provide genuine, compassionate, and quality care. You may receive a survey regarding your visit with me, and I welcome your feedback! Thanks so much for taking the time to complete this. I look forward to seeing you again.      Gelene Mink, PhD, ANP-BC [facility]

## 2023-08-16 ENCOUNTER — Telehealth: Payer: Self-pay

## 2023-08-16 NOTE — Telephone Encounter (Signed)
Contacted [employer] regarding appeal for Linzess. They stated that it normally takes 15 business days for the appeal and it was submitted on 08/11/2023.

## 2023-08-29 ENCOUNTER — Encounter: Payer: Self-pay | Admitting: Gastroenterology

## 2023-08-29 ENCOUNTER — Ambulatory Visit (INDEPENDENT_AMBULATORY_CARE_PROVIDER_SITE_OTHER): Payer: PRIVATE HEALTH INSURANCE | Admitting: Gastroenterology

## 2023-08-29 VITALS — BP 126/76 | HR 71 | Temp 98.2°F | Ht 71.0 in | Wt 137.2 lb

## 2023-08-29 DIAGNOSIS — K641 Second degree hemorrhoids: Secondary | ICD-10-CM

## 2023-08-29 NOTE — Patient Instructions (Signed)
  Please avoid straining.  You should limit your toilet time to 2-3 minutes at the most.   I recommend Benefiber 2 teaspoons each morning in the beverage of your choice!  Please call me with any concerns or issues!  I will see you in follow-up in about 4 weeks! If we need to do more banding, we will.  I enjoyed seeing you again today! I value our relationship and want to provide genuine, compassionate, and quality care. You may receive a survey regarding your visit with me, and I welcome your feedback! Thanks so much for taking the time to complete this. I look forward to seeing you again.      Gelene Mink, PhD, ANP-BC [facility]

## 2023-08-29 NOTE — Progress Notes (Signed)
    [facility] BANDING PROCEDURE NOTE  Jacob Eaton is a 42 y.o. male presenting today for consideration of hemorrhoid banding. Last colonoscopy Jan 2020 with internal hemorrhoids. He has had left lateral banding thus far.    The patient presents with symptomatic grade 2 hemorrhoids, unresponsive to maximal medical therapy, requesting rubber band ligation of his/her hemorrhoidal disease. All risks, benefits, and alternative forms of therapy were described and informed consent was obtained.   The decision was made to band the right posterior internal hemorrhoid, and the [facility] O'Regan System was used to perform band ligation without complication. Digital anorectal examination was then performed to assure proper positioning of the band, and to adjust the banded tissue as required. Attempted right anterior but only small amount of tissue was obtained. The patient was discharged home without pain or other issues. Dietary and behavioral recommendations were given, along with follow-up instructions. The patient will return in several weeks for routine follow-up, and we can band again if needed.   No complications were encountered and the patient tolerated the procedure well.   Gelene Mink, PhD, ANP-BC [facility]

## 2023-10-16 ENCOUNTER — Ambulatory Visit: Payer: PRIVATE HEALTH INSURANCE | Admitting: Gastroenterology

## 2023-10-17 ENCOUNTER — Ambulatory Visit: Payer: PRIVATE HEALTH INSURANCE | Admitting: Gastroenterology

## 2023-10-19 ENCOUNTER — Ambulatory Visit (INDEPENDENT_AMBULATORY_CARE_PROVIDER_SITE_OTHER): Payer: PRIVATE HEALTH INSURANCE | Admitting: Gastroenterology

## 2023-10-19 ENCOUNTER — Encounter (INDEPENDENT_AMBULATORY_CARE_PROVIDER_SITE_OTHER): Payer: Self-pay | Admitting: Gastroenterology

## 2023-10-19 ENCOUNTER — Encounter: Payer: Self-pay | Admitting: Gastroenterology

## 2023-10-19 VITALS — BP 138/83 | HR 68 | Temp 98.6°F | Ht 71.0 in | Wt 138.4 lb

## 2023-10-19 DIAGNOSIS — K219 Gastro-esophageal reflux disease without esophagitis: Secondary | ICD-10-CM | POA: Diagnosis not present

## 2023-10-19 DIAGNOSIS — K5900 Constipation, unspecified: Secondary | ICD-10-CM

## 2023-10-19 NOTE — Patient Instructions (Signed)
We are arranging an ultrasound in the near future of your liver!  I am glad you are doing well!  We will see you in 6 months!   I enjoyed seeing you again today! I value our relationship and want to provide genuine, compassionate, and quality care. You may receive a survey regarding your visit with me, and I welcome your feedback! Thanks so much for taking the time to complete this. I look forward to seeing you again.      Gelene Mink, PhD, ANP-BC [facility]

## 2023-10-19 NOTE — Progress Notes (Signed)
Gastroenterology Office Note     Primary Care Physician:  Gabriel Earing, FNP  Primary Gastroenterologist: Dr. Marletta Lor    Chief Complaint   Chief Complaint  Patient presents with   Gastroesophageal Reflux    Follow up on GERD. Takes protonix. States doing well.   Constipation    Follow up on constipation. Takes linzess 145 mcg. States doing well with the medication.      History of Present Illness   Jacob Eaton is a 43 y.o. 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  Insufficient tissue present of right anterior in Dec 2024, so he has only had 2 columns banded. He is doing quite well from this standpoint.  GERD is managed with pantoprazole once daily. He has plenty of refills. No dysphagia or abdominal pain.  Constipation managed with Linzess 145 mcg daily, and he has a BM about every other day that is productive without straining. He receives this from patient assistance.  Continues to follow with Hematology for phlebotomies as needed.     Colonoscopy Jan 2020: external and internal hemorrhoids, anal fissure.   Past Medical History:  Diagnosis Date   Chronic tension headaches    Constipation    Depression    GERD (gastroesophageal reflux disease)    Hemochromatosis    Hemorrhoids    Documented on colonoscopy in January 2020.    Past Surgical History:  Procedure Laterality Date   BIOPSY  10/08/2018   Procedure: BIOPSY;  Surgeon: West Bali, MD;  [facility]: [facility];  Service: Endoscopy;;  sigmoid and rectal bx's   CATARACT EXTRACTION W/PHACO Right 09/24/2021   Procedure: CATARACT EXTRACTION PHACO AND INTRAOCULAR LENS PLACEMENT (IOC);  Surgeon: [name], MD;  Location: [facility];  Service: Ophthalmology;  Laterality: Right;  CDE 25.23    COLONOSCOPY N/A 10/08/2018   Dr. Darrick Penna: external and internal hemorrhoids, anal fissure.  Repeat at age 42.   ESOPHAGEAL ATRESIA REPAIR  1990    Current Outpatient Medications  Medication Sig Dispense Refill   amitriptyline (ELAVIL) 50 MG tablet Take 1 tablet (50 mg total) by mouth at bedtime. 90 tablet 3   ergocalciferol (VITAMIN D2) 1.25 MG (50000 UT) capsule Take 1 capsule (50,000 Units total) by mouth every 14 (fourteen) days. 5 capsule 3   linaclotide (LINZESS) 145 MCG CAPS capsule Take 1 capsule (145 mcg total) by mouth daily before breakfast. 90 capsule 3   OVER THE COUNTER MEDICATION every other day. Prune tablets takes 2 tablets daily     pantoprazole (PROTONIX) 40 MG tablet Take 1 tablet (40 mg total) by mouth daily. 90 tablet 3   rizatriptan (MAXALT) 10 MG tablet TAKE 1 TABLET BY MOUTH AS NEEDED FOR migraine (MAY REPEAT in TWO hours if needed) 10 tablet 5   triamcinolone cream (KENALOG) 0.1 % Apply 1 application topically 2 (two) times daily. 80 g 2   No current facility-administered medications for this visit.    Allergies as of 10/19/2023 - Review Complete 10/19/2023  Allergen Reaction Noted   Naproxen Hives 04/27/2017   Permethrin Hives 11/19/2020    Family History  Problem Relation Age of Onset   Seizures Mother    Heart disease Father    Cancer Father    Asthma Sister    Colon cancer Neg Hx    Colon polyps Neg Hx  Social History   Socioeconomic History   Marital status: Single    Spouse name: Not on file   Number of children: Not on file   Years of education: Not on file   Highest education level: Not on file  Occupational History   Not on file  Tobacco Use   Smoking status: Former    Current packs/day: 0.00    Types: Cigarettes    Start date: 09/20/1999    Quit date: 09/19/2000    Years since quitting: 23.0   Smokeless tobacco: Never  Vaping Use   Vaping status: Never Used  Substance and Sexual Activity   Alcohol use: Yes    Comment: 2 mixed  drink per week. Quit early 2023.   Drug use: No   Sexual activity: Yes  Other Topics Concern   Not on file  Social History Narrative   Not on file   Social Drivers of Health   Financial Resource Strain: Not on file  Food Insecurity: Not on file  Transportation Needs: Not on file  Physical Activity: Not on file  Stress: Not on file  Social Connections: Not on file  Intimate Partner Violence: Not on file     Review of Systems   Gen: Denies any fever, chills, fatigue, weight loss, lack of appetite.  CV: Denies chest pain, heart palpitations, peripheral edema, syncope.  Resp: Denies shortness of breath at rest or with exertion. Denies wheezing or cough.  GI: Denies dysphagia or odynophagia. Denies jaundice, hematemesis, fecal incontinence. GU : Denies urinary burning, urinary frequency, urinary hesitancy MS: Denies joint pain, muscle weakness, cramps, or limitation of movement.  Derm: Denies rash, itching, dry skin Psych: Denies depression, anxiety, memory loss, and confusion Heme: Denies bruising, bleeding, and enlarged lymph nodes.   Physical Exam   BP 138/83   Pulse 68   Temp 98.6 F (37 C) (Oral)   Ht 5\' 11"  (1.803 m)   Wt 138 lb 6.4 oz (62.8 kg)   BMI 19.30 kg/m  General:   Alert and oriented. Pleasant and cooperative. Well-nourished and well-developed.  Head:  Normocephalic and atraumatic. Eyes:  Without icterus Abdomen:  +BS, soft, non-tender and non-distended. No HSM noted. No guarding or rebound. No masses appreciated.  Rectal:  Deferred  Msk:  Symmetrical without gross deformities. Normal posture. Extremities:  Without edema. Neurologic:  Alert and  oriented x4;  grossly normal neurologically. Skin:  Intact without significant lesions or rashes. Psych:  Alert and cooperative. Normal mood and affect.   Assessment   Jacob Eaton is a 43 y.o. 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  GERD well managed with PPI daily, and constipation managed with Linzess 145 mcg daily.  Regarding Hemochromatosis, he had no fibrosis on liver biopsy in 2019; however, I would like to just spot check an [place] at this time. Continue to follow with Hematology.     PLAN    Continue PPI daily and Linzess 145 mcg US abdomen complete Return in 6 months   Gelene Mink, PhD, [facility]enter[facility]

## 2023-10-24 ENCOUNTER — Ambulatory Visit (HOSPITAL_COMMUNITY)
Admission: RE | Admit: 2023-10-24 | Discharge: 2023-10-24 | Disposition: A | Discharge status: Home / Self Care | Payer: BC Managed Care – PPO | Source: Ambulatory Visit | Attending: Gastroenterology | Admitting: Gastroenterology

## 2024-03-14 ENCOUNTER — Encounter: Payer: Self-pay | Admitting: Gastroenterology

## 2024-06-05 ENCOUNTER — Inpatient Hospital Stay: Payer: PRIVATE HEALTH INSURANCE | Attending: Physician Assistant

## 2024-06-05 DIAGNOSIS — Z809 Family history of malignant neoplasm, unspecified: Secondary | ICD-10-CM | POA: Insufficient documentation

## 2024-06-05 DIAGNOSIS — Z825 Family history of asthma and other chronic lower respiratory diseases: Secondary | ICD-10-CM | POA: Insufficient documentation

## 2024-06-05 DIAGNOSIS — R1031 Right lower quadrant pain: Secondary | ICD-10-CM | POA: Insufficient documentation

## 2024-06-05 DIAGNOSIS — R5383 Other fatigue: Secondary | ICD-10-CM | POA: Insufficient documentation

## 2024-06-05 DIAGNOSIS — R634 Abnormal weight loss: Secondary | ICD-10-CM | POA: Insufficient documentation

## 2024-06-05 DIAGNOSIS — Z886 Allergy status to analgesic agent status: Secondary | ICD-10-CM | POA: Insufficient documentation

## 2024-06-05 DIAGNOSIS — E559 Vitamin D deficiency, unspecified: Secondary | ICD-10-CM

## 2024-06-05 DIAGNOSIS — Z87891 Personal history of nicotine dependence: Secondary | ICD-10-CM | POA: Insufficient documentation

## 2024-06-05 DIAGNOSIS — Z8249 Family history of ischemic heart disease and other diseases of the circulatory system: Secondary | ICD-10-CM | POA: Insufficient documentation

## 2024-06-05 DIAGNOSIS — Z79899 Other long term (current) drug therapy: Secondary | ICD-10-CM | POA: Insufficient documentation

## 2024-06-05 DIAGNOSIS — M1990 Unspecified osteoarthritis, unspecified site: Secondary | ICD-10-CM | POA: Insufficient documentation

## 2024-06-05 DIAGNOSIS — R911 Solitary pulmonary nodule: Secondary | ICD-10-CM | POA: Insufficient documentation

## 2024-06-05 DIAGNOSIS — Z8719 Personal history of other diseases of the digestive system: Secondary | ICD-10-CM | POA: Insufficient documentation

## 2024-06-05 LAB — IRON AND TIBC
Iron: 162 ug/dL (ref 45–182)
Saturation Ratios: 64 % — ABNORMAL HIGH (ref 17.9–39.5)
TIBC: 252 ug/dL (ref 250–450)
UIBC: 90 ug/dL

## 2024-06-05 LAB — COMPREHENSIVE METABOLIC PANEL WITH GFR
ALT: 28 U/L (ref 0–44)
AST: 24 U/L (ref 15–41)
Albumin: 4.1 g/dL (ref 3.5–5.0)
Alkaline Phosphatase: 218 U/L — ABNORMAL HIGH (ref 38–126)
Anion gap: 12 (ref 5–15)
BUN: 15 mg/dL (ref 6–20)
CO2: 25 mmol/L (ref 22–32)
Calcium: 9.3 mg/dL (ref 8.9–10.3)
Chloride: 101 mmol/L (ref 98–111)
Creatinine, Ser: 1.05 mg/dL (ref 0.61–1.24)
GFR, Estimated: >60 mL/min (ref >60)
Glucose, Bld: 97 mg/dL (ref 70–99)
Potassium: 3.7 mmol/L (ref 3.5–5.1)
Sodium: 138 mmol/L (ref 135–145)
Total Bilirubin: 0.7 mg/dL (ref 0.0–1.2)
Total Protein: 6.7 g/dL (ref 6.5–8.1)

## 2024-06-05 LAB — CBC WITH DIFFERENTIAL/PLATELET
Abs Immature Granulocytes: 0.01 K/uL (ref 0.00–0.07)
Basophils Absolute: 0 K/uL (ref 0.0–0.1)
Basophils Relative: 1 %
Eosinophils Absolute: 0.1 K/uL (ref 0.0–0.5)
Eosinophils Relative: 3 %
HCT: 44.2 % (ref 39.0–52.0)
Hemoglobin: 15.1 g/dL (ref 13.0–17.0)
Immature Granulocytes: 0 %
Lymphocytes Relative: 22 %
Lymphs Abs: 0.9 K/uL (ref 0.7–4.0)
MCH: 33.3 pg (ref 26.0–34.0)
MCHC: 34.2 g/dL (ref 30.0–36.0)
MCV: 97.6 fL (ref 80.0–100.0)
Monocytes Absolute: 0.6 K/uL (ref 0.1–1.0)
Monocytes Relative: 14 %
Neutro Abs: 2.5 K/uL (ref 1.7–7.7)
Neutrophils Relative %: 60 %
Platelets: 180 K/uL (ref 150–400)
RBC: 4.53 MIL/uL (ref 4.22–5.81)
RDW: 12.4 % (ref 11.5–15.5)
WBC: 4.3 K/uL (ref 4.0–10.5)
nRBC: 0 % (ref 0.0–0.2)

## 2024-06-05 LAB — FERRITIN: Ferritin: 59 ng/mL (ref 24–336)

## 2024-06-05 LAB — VITAMIN D 25 HYDROXY (VIT D DEFICIENCY, FRACTURES): Vit D, 25-Hydroxy: 30.35 ng/mL (ref 30–100)

## 2024-06-11 NOTE — Progress Notes (Unsigned)
 [facility] [address], [place] [address]   CLINIC:  Medical Oncology/Hematology  PCP:  Joesph Annabella HERO, FNP [address] [place] [address] [phone]   REASON FOR VISIT:  Follow-up for hereditary hemochromatosis   CURRENT THERAPY: Intermittent phlebotomy  INTERVAL HISTORY:   Mr. Moring 43 y.o. 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  He was last seen by Pleasant Barefoot PA-C on 06/06/2023.   At today's visit, he reports feeling fairly well, apart from some fatigue related to working second shift.   No recent hospitalizations, surgeries, or changes in baseline health status.     Patient reports baseline arthritis pain. He reports occasional RLQ cramping abdominal pain, but has not yet discussed this with his GI or PCP. He has baseline fatigue. He has not had any blood donation or phlebotomy since his last visit.  Mr. Harner had previously reported unintentional weight loss.   Weight today is stable compared to his visit a year ago. He denies any unexplained fever, chills, or night sweats.  No nausea, vomiting, or diarrhea.   He has 50% energy and 100% appetite.  ASSESSMENT & PLAN:  1.   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  No pathological fibrosis.  Iron stain shows mostly mild to focal moderate granular iron disposition with hepatocytes (1-2+) of uncertain significance. - Ferritin levels have never been >400 (records extending back to 2019) - Not a regular drinker.  No family history of hemochromatosis on the mother side.  Does not know the family history on the father's side.  Hepatitis panel was negative.  Normal  ceruloplasmin. - He has been counseled about well-balanced diet and avoid iron and vitamin C supplements. - He was previously getting phlebotomies every 8 weeks at [facility] in [place].  Last phlebotomy was December 2021. - Baseline fatigue, arthritic pain, and occasional abdominal pain.  - Most recent iron panel (06/05/2024) shows ferritin 59 with iron saturation 64%.  Goal is ferritin < 50.  Normal hemoglobin 15.1 on CBC. - CMP (06/05/2024) with mild elevation in alkaline phosphatase 218, normal AST and ALT. - PLAN: No strong indication for phlebotomy, as he has never had evidence of severely elevated ferritin and current ferritin is < 100. Goal is to keep ferritin < 50-100   - Repeat labs and RTC in 1 year  - Counseled about well-balanced diet and avoiding iron and vitamin C supplements. - Continue GI follow-up (Dr. Waynetta Stager, NP) for transaminitis   2.  Vitamin D  deficiency, RESOLVED - Was previously taking vitamin D  2000 units daily, reportedly stopped taking them 2+ years ago because he felt weird - Prior vitamin D  (03/31/2022) was low at 14.41 - Most recent labs (06/05/2024) show normal vitamin D  30.35 - He is not currently taking any vitamin D  supplement - PLAN: No indication for vitamin D  supplement at this time.  Defer ongoing management to PCP.  3.  Unintentional weight loss - Patient complained of unintentional weight loss of previous visit, with weight in August 2022 at 151 pounds, lost about 20 pounds in 1 year - TSH normal in September 2023 - CT abdomen/pelvis (04/13/2022) negative for any acute abnormality in abdomen or pelvis.  4 mm nodule in right  lower lobe of lung unchanged from 2017, favors benign etiology. - He denies any unexplained fever, chills, or night sweats.  No nausea, vomiting, or diarrhea.  Occasional mild abdominal pain relieved with bowel movement. - Weight today is 134 pounds (stable for the past year) - PLAN: Continue to encourage adequate  nutrition.  Continue PCP follow-up.  We will continue to monitor weight at follow-up visits.    PLAN SUMMARY: >> Labs (CBC/D, CMP, ferritin, iron/TIBC) in 1 year >> OFFICE visit in 1 year (1 week after labs)     REVIEW OF SYSTEMS:  Review of Systems  Constitutional:  Positive for fatigue. Negative for appetite change, chills, diaphoresis, fever and unexpected weight change.  HENT:   Negative for lump/mass and nosebleeds.   Eyes:  Negative for eye problems.  Respiratory:  Negative for cough, hemoptysis and shortness of breath.   Cardiovascular:  Negative for chest pain, leg swelling and palpitations.  Gastrointestinal:  Positive for abdominal pain (occasional RLQ pain). Negative for blood in stool, constipation, diarrhea, nausea and vomiting.  Genitourinary:  Negative for hematuria.   Musculoskeletal:  Positive for arthralgias.  Skin: Negative.   Neurological:  Positive for headaches (migraines). Negative for dizziness and light-headedness.  Hematological:  Does not bruise/bleed easily.     PHYSICAL EXAM:  ECOG PERFORMANCE STATUS: 1 - Symptomatic but completely ambulatory  Vitals:   06/12/24 0806  BP: 121/87  Pulse: 66  Resp: 18  Temp: (!) 96.6 F (35.9 C)  SpO2: 100%    Filed Weights   06/12/24 0806  Weight: 134 lb 14.7 oz (61.2 kg)    Physical Exam Constitutional:      Appearance: Normal appearance. He is normal weight.     Comments: Thin body habitus  Cardiovascular:     Heart sounds: Normal heart sounds.  Pulmonary:     Breath sounds: Normal breath sounds.  Neurological:     General: No focal deficit present.     Mental Status: Mental status is at baseline.  Psychiatric:        Behavior: Behavior normal. Behavior is cooperative.     PAST MEDICAL/SURGICAL HISTORY:  Past Medical History:  Diagnosis Date   Chronic tension headaches    Constipation    Depression    GERD (gastroesophageal reflux disease)    Hemochromatosis    Hemorrhoids    Documented on  colonoscopy in January 2020.   Past Surgical History:  Procedure Laterality Date   BIOPSY  10/08/2018   Procedure: BIOPSY;  Surgeon: Harvey Margo CROME, MD;  Location: [facility];  Service: Endoscopy;;  sigmoid and rectal bx's   CATARACT EXTRACTION W/PHACO Right 09/24/2021   Procedure: CATARACT EXTRACTION PHACO AND INTRAOCULAR LENS PLACEMENT (IOC);  Surgeon: Harrie Agent, MD;  Location: [facility] ORS;  Service: Ophthalmology;  Laterality: Right;  CDE 25.23   COLONOSCOPY N/A 10/08/2018   Dr. Harvey: external and internal hemorrhoids, anal fissure.  Repeat at age 39.   ESOPHAGEAL ATRESIA REPAIR  1990    SOCIAL HISTORY:  Social History   Socioeconomic History   Marital status: Single    Spouse name: Not on file   Number of children: Not on file   Years of education: Not on file   Highest education level: Not on file  Occupational History   Not on file  Tobacco Use   Smoking status: Former    Current packs/day: 0.00    Types: Cigarettes    Start date: 09/20/1999    Quit date: 09/19/2000  Years since quitting: 23.7   Smokeless tobacco: Never  Vaping Use   Vaping status: Never Used  Substance and Sexual Activity   Alcohol use: Yes    Comment: 2 mixed drink per week. Quit early 2023.   Drug use: No   Sexual activity: Yes  Other Topics Concern   Not on file  Social History Narrative   Not on file   Social Drivers of Health   Financial Resource Strain: Not on file  Food Insecurity: Not on file  Transportation Needs: Not on file  Physical Activity: Not on file  Stress: Not on file  Social Connections: Not on file  Intimate Partner Violence: Not on file    FAMILY HISTORY:  Family History  Problem Relation Age of Onset   Seizures Mother    Heart disease Father    Cancer Father    Asthma Sister    Colon cancer Neg Hx    Colon polyps Neg Hx     CURRENT MEDICATIONS:  Outpatient Encounter Medications as of 06/12/2024  Medication Sig Note   amitriptyline  (ELAVIL ) 50 MG tablet  Take 1 tablet (50 mg total) by mouth at bedtime.    ergocalciferol  (VITAMIN D2) 1.25 MG (50000 UT) capsule Take 1 capsule (50,000 Units total) by mouth every 14 (fourteen) days.    linaclotide  (LINZESS ) 145 MCG CAPS capsule Take 1 capsule (145 mcg total) by mouth daily before breakfast.    OVER THE COUNTER MEDICATION every other day. Prune tablets takes 2 tablets daily    pantoprazole  (PROTONIX ) 40 MG tablet Take 1 tablet (40 mg total) by mouth daily.    rizatriptan  (MAXALT ) 10 MG tablet TAKE 1 TABLET BY MOUTH AS NEEDED FOR migraine (MAY REPEAT in TWO hours if needed)    triamcinolone  cream (KENALOG ) 0.1 % Apply 1 application topically 2 (two) times daily. 08/15/2023: As needed.    No facility-administered encounter medications on file as of 06/12/2024.    ALLERGIES:  Allergies  Allergen Reactions   Naproxen  Hives   Permethrin Hives    LABORATORY DATA:  I have reviewed the labs as listed.  CBC    Component Value Date/Time   WBC 4.3 06/05/2024 0829   RBC 4.53 06/05/2024 0829   HGB 15.1 06/05/2024 0829   HGB 15.5 05/25/2022 0924   HCT 44.2 06/05/2024 0829   HCT 45.8 05/25/2022 0924   PLT 180 06/05/2024 0829   PLT 189 05/25/2022 0924   MCV 97.6 06/05/2024 0829   MCV 97 05/25/2022 0924   MCH 33.3 06/05/2024 0829   MCHC 34.2 06/05/2024 0829   RDW 12.4 06/05/2024 0829   RDW 12.1 05/25/2022 0924   LYMPHSABS 0.9 06/05/2024 0829   LYMPHSABS 0.7 05/25/2022 0924   MONOABS 0.6 06/05/2024 0829   EOSABS 0.1 06/05/2024 0829   EOSABS 0.1 05/25/2022 0924   BASOSABS 0.0 06/05/2024 0829   BASOSABS 0.0 05/25/2022 0924      Latest Ref Rng & Units 06/05/2024    8:29 AM 05/30/2023    7:45 AM 06/28/2022    9:46 AM  CMP  Glucose 70 - 99 mg/dL 97  98  898   BUN 6 - 20 mg/dL 15  13  10    Creatinine 0.61 - 1.24 mg/dL 8.94  9.06  9.07   Sodium 135 - 145 mmol/L 138  136  137   Potassium 3.5 - 5.1 mmol/L 3.7  3.0  3.6   Chloride 98 - 111 mmol/L 101  103  104   CO2  22 - 32 mmol/L 25  25  26     Calcium 8.9 - 10.3 mg/dL 9.3  8.8  9.4   Total Protein 6.5 - 8.1 g/dL 6.7  6.8  7.3   Total Bilirubin 0.0 - 1.2 mg/dL 0.7  0.6  0.7   Alkaline Phos 38 - 126 U/L 218  205  332   AST 15 - 41 U/L 24  24  47   ALT 0 - 44 U/L 28  33  70     DIAGNOSTIC IMAGING:  I have independently reviewed the relevant imaging and discussed with the patient.   WRAP UP:  All questions were answered. The patient knows to call the clinic with any problems, questions or concerns.  Medical decision making: Low  Time spent on visit: I spent 15 minutes counseling the patient face to face. The total time spent in the appointment was 22 minutes and more than 50% was on counseling.  Pleasant CHRISTELLA Barefoot, PA-C  06/12/24 8:31 AM

## 2024-06-12 ENCOUNTER — Inpatient Hospital Stay (HOSPITAL_BASED_OUTPATIENT_CLINIC_OR_DEPARTMENT_OTHER): Payer: PRIVATE HEALTH INSURANCE | Admitting: Physician Assistant

## 2024-06-12 DIAGNOSIS — E559 Vitamin D deficiency, unspecified: Secondary | ICD-10-CM

## 2024-06-12 NOTE — Patient Instructions (Signed)
 [facility] Discharge Instructions  You were seen today by Pleasant Barefoot PA-C for your elevated iron levels (hereditary hemochromatosis).  Your iron levels are within their target range.  FOLLOW UP APPOINTMENT: We will check your labs in 1 year and see you for follow-up visit 1 week after.  ** Thank you for trusting me with your healthcare!  I strive to provide all of my patients with quality care at each visit.  If you receive a survey for this visit, I would be so grateful to you for taking the time to provide feedback.  Thank you in advance!  ~ Randy Castrejon                                        Dr. Mickiel Davonna Pleasant Barefoot, PA-C   Delon Hope, NP   - - - - - - - - - - - - - - - - - -     Thank you for choosing [facility] at [facility] to provide your oncology and hematology care.  To afford each patient quality time with our provider, please arrive at least 15 minutes before your scheduled appointment time.   If you have a lab appointment with the Cancer Center please come in thru the Main Entrance and check in at the main information desk.  You need to re-schedule your appointment should you arrive 10 or more minutes late.  We strive to give you quality time with our providers, and arriving late affects you and other patients whose appointments are after yours.  Also, if you no show three or more times for appointments you may be dismissed from the clinic at the providers discretion.     Again, thank you for choosing [facility].  Our hope is that these requests will decrease the amount of time that you wait before being seen by our physicians.       _____________________________________________________________  Should you have questions after your visit to [facility], please contact our office at [phone] and follow the prompts.  Our office hours are 8:00 a.m. and 4:30 p.m.  Monday - Friday.  Please note that voicemails left after 4:00 p.m. may not be returned until the following business day.  We are closed weekends and major holidays.  You do have access to a nurse 24-7, just call the main number to the clinic [phone] and do not press any options, hold on the line and a nurse will answer the phone.    For prescription refill requests, have your pharmacy contact our office and allow 72 hours.    Due to Covid, you will need to wear a mask upon entering the hospital. If you do not have a mask, a mask will be given to you at the Main Entrance upon arrival. For doctor visits, patients may have 1 support person age 56 or older with them. For treatment visits, patients can not have anyone with them due to social distancing guidelines and our immunocompromised population.

## 2024-06-25 ENCOUNTER — Ambulatory Visit: Payer: Self-pay

## 2024-06-25 NOTE — Telephone Encounter (Signed)
 Patient endorses right sided chest pain for over 2 weeks. Patient states he feels the pain in his right lung. Patient is recommended to UC to be evaluated-patient refused stating he is on his way to work. Asking to be seen in the office. Requesting a call back.   FYI Only or Action Required?: Action required by provider: request for appointment. Refused UC recommendation.   Patient was last seen in primary care on 05/25/2022 by Joesph Annabella HERO, FNP.  Called Nurse Triage reporting Chest Pain.  Symptoms began 2 weeks ago.  Interventions attempted: Rest, hydration, or home remedies.  Symptoms are: unchanged.  Triage Disposition: Go to ED Now (or PCP Triage)  Patient/caregiver understands and will follow disposition?: No, refuses disposition  Copied from CRM #[id number]. Topic: Appointments - Appointment Scheduling >> Jun 25, 2024 11:12 AM Tonda B wrote: Patient/patient representative is calling to schedule an appointment. Refer to attachments for appointment information.  Patient has pain in chest Reason for Disposition  Taking a deep breath makes pain worse  Answer Assessment - Initial Assessment Questions 1. LOCATION: Where does it hurt?       Right side of chest 2. RADIATION: Does the pain go anywhere else? (e.g., into neck, jaw, arms, back)     Goes into back 3. ONSET: When did the chest pain begin? (Minutes, hours or days)      Started two weeks ago 4. PATTERN: Does the pain come and go, or has it been constant since it started?  Does it get worse with exertion?      constant 5. DURATION: How long does it last (e.g., seconds, minutes, hours)     Patient states the pain continues 6. SEVERITY: How bad is the pain?  (e.g., Scale 1-10; mild, moderate, or severe)     5 out of 10 7. CARDIAC RISK FACTORS: Do you have any history of heart problems or risk factors for heart disease? (e.g., angina, prior heart attack; diabetes, high blood pressure, high cholesterol, smoker,  or strong family history of heart disease)     no 8. PULMONARY RISK FACTORS: Do you have any history of lung disease?  (e.g., blood clots in lung, asthma, emphysema, birth control pills)     no 9. CAUSE: What do you think is causing the chest pain?     Patient is concerned for possible infection 10. OTHER SYMPTOMS: Do you have any other symptoms? (e.g., dizziness, nausea, vomiting, sweating, fever, difficulty breathing, cough)       Shortness of breath, cough  Protocols used: Chest Pain-A-AH

## 2024-06-25 NOTE — Telephone Encounter (Signed)
 Patient was advised to go to ED multiple times for this issue, by multiple nurses, and patient refused. Patient is scheduled to be seen on 10/9 for this but is aware that he should really go to ED.

## 2024-06-27 ENCOUNTER — Ambulatory Visit: Payer: Self-pay

## 2024-06-27 ENCOUNTER — Encounter: Payer: Self-pay | Admitting: Family Medicine

## 2024-06-27 VITALS — BP 112/69 | HR 65 | Temp 98.1°F | Ht 71.0 in | Wt 132.6 lb

## 2024-06-27 DIAGNOSIS — J209 Acute bronchitis, unspecified: Secondary | ICD-10-CM

## 2024-06-27 DIAGNOSIS — M940 Chondrocostal junction syndrome [Tietze]: Secondary | ICD-10-CM

## 2024-06-27 MED ORDER — AZITHROMYCIN 250 MG PO TABS: ORAL_TABLET | ORAL | 0 refills | Status: AC

## 2024-06-27 MED ORDER — BENZONATATE 100 MG PO CAPS: 100.0000 mg | ORAL_CAPSULE | Freq: Two times a day (BID) | ORAL | 0 refills | Status: AC | PRN

## 2024-06-27 MED ORDER — PREDNISONE 20 MG PO TABS: 40.0000 mg | ORAL_TABLET | Freq: Every day | ORAL | 0 refills | Status: AC

## 2024-06-27 NOTE — Progress Notes (Signed)
 Acute Office Visit  Subjective:     Patient ID: Jacob Eaton, male    DOB: [birth year], 43 y.o.   MRN: [id number]  Chief Complaint  Patient presents with   Cough    Cough    History of Present Illness   Jacob Eaton is a 43 year old male who presents with right-sided rib pain and cough.  Right-sided rib pain - Present for two weeks - Constant in nature - Worsened by movement, coughing, deep breathing, and sneezing - Partially relieved when sitting still, depending on posture  Cough and respiratory symptoms - Cough present for two weeks - Productive of a small amount of white sputum - Coughing fits sometimes cause shortness of breath  - No shortness of breath with exertion or at rest - Occasional wheezing during coughing fits - No fever, ear pain, or history of asthma, COPD, or pneumonia  Medication use - Taking off-brand Sudafed without symptom relief       Review of Systems  Respiratory:  Positive for cough.    As per HPI.      Objective:    BP 112/69   Pulse 65   Temp 98.1 F (36.7 C) (Temporal)   Ht 5' 11 (1.803 m)   Wt 132 lb 9.6 oz (60.1 kg)   SpO2 100%   BMI 18.49 kg/m    Physical Exam Vitals and nursing note reviewed.  Constitutional:      Appearance: He is underweight.  HENT:     Right Ear: Tympanic membrane, ear canal and external ear normal.     Left Ear: Tympanic membrane, ear canal and external ear normal.     Nose: Congestion present.     Mouth/Throat:     Mouth: Mucous membranes are moist.     Pharynx: Oropharynx is clear. No oropharyngeal exudate or posterior oropharyngeal erythema.  Eyes:     General:        Right eye: No discharge.        Left eye: No discharge.     Conjunctiva/sclera: Conjunctivae normal.  Cardiovascular:     Rate and Rhythm: Normal rate and regular rhythm.     Heart sounds: Normal heart sounds. No murmur heard. Pulmonary:     Effort: Pulmonary effort is normal. No respiratory distress.      Breath sounds: Normal breath sounds. No stridor. No wheezing, rhonchi or rales.  Chest:     Chest wall: Tenderness (right posterior) present.  Musculoskeletal:     Cervical back: No rigidity.     Right lower leg: No edema.     Left lower leg: No edema.  Skin:    General: Skin is warm and dry.  Neurological:     General: No focal deficit present.     Mental Status: He is alert and oriented to person, place, and time.  Psychiatric:        Mood and Affect: Mood normal.        Behavior: Behavior normal.     No results found for any visits on 06/27/24.      Assessment & Plan:   Jacob Eaton was seen today for cough.  Diagnoses and all orders for this visit:  Acute bronchitis, unspecified organism -     predniSONE  (DELTASONE ) 20 MG tablet; Take 2 tablets (40 mg total) by mouth daily with breakfast for 5 days. -     azithromycin (ZITHROMAX Z-PAK) 250 MG tablet; As directed -     benzonatate (TESSALON)  100 MG capsule; Take 1 capsule (100 mg total) by mouth 2 (two) times daily as needed for cough.  Costochondritis -     predniSONE  (DELTASONE ) 20 MG tablet; Take 2 tablets (40 mg total) by mouth daily with breakfast for 5 days.      Acute bronchitis Cough with white sputum, no fever, mild wheezing during coughing fits. Lungs clear, reducing pneumonia concern. Diagnosis based on symptoms and clinical examination. - Prescribed prednisone  burst - Prescribed Z-Pak for potential bacterial infection. - Offered Tessalon Perles for cough if affordable. - Advised to return if symptoms worsen or fever/significant shortness of breath develops.  Costochondritis Right-sided rib pain due to intercostal muscle inflammation from coughing. Diagnosis based on symptoms and examination. - Prescribed prednisone  burst      Return to office for new or worsening symptoms, or if symptoms persist.   The patient indicates understanding of these issues and agrees with the plan.  Annabella CHRISTELLA Search,  FNP

## 2024-06-27 NOTE — Patient Instructions (Signed)
Costochondritis  Costochondritis is inflammation of the tissue (cartilage) that connects the ribs to the breastbone (sternum). This causes pain in the front of the chest. The pain often starts slowly and involves more than one rib. What are the causes? This condition results from stress on the cartilage where your ribs attach to your sternum. The exact cause of this stress is not always known. The cause may be: Chest injury. Exercise or activity. This may include lifting. Severe coughing. What increases the risk? You are more likely to develop this condition if: You are male. You are 30-40 years old. You just started a new exercise or work activity. You have low levels of vitamin D. You have a condition that makes you cough often. What are the signs or symptoms? The main symptom of this condition is chest pain. The pain: Starts slowly and can be sharp or dull. Gets worse with deep breathing, coughing, or exercise. Gets better with rest. May be worse when you press on the affected area of your ribs and sternum. How is this diagnosed? This condition is diagnosed based on your symptoms, your medical history, and a physical exam. Your health care provider will check for pain when pressing on your sternum. You may also have tests to rule out other causes of chest pain. These may include: A chest X-ray. This may be done to check for lung problems. An electrocardiogram (ECG). This may be done to see if you have a heart problem that could be causing the pain. An imaging scan. This may be done to rule out a broken bone (fracture) in your chest or ribcage. How is this treated? This condition may go away on its own over time. Your health care provider may prescribe an NSAID, such as ibuprofen, to reduce pain and inflammation. Treatment may also include: Resting and avoiding activities that make pain worse. Putting heat or ice on the area to reduce pain and inflammation. Doing exercises to  stretch your chest muscles. If these treatments do not help, your health care provider may inject a numbing medicine at the spot where the sternum and rib connect. This can help relieve the pain. Follow these instructions at home: Managing pain, stiffness, and swelling     If directed, put ice on the painful area. To do this: Put ice in a plastic bag. Place a towel between your skin and the bag. Leave the ice on for 20 minutes, 2-3 times a day. If directed, apply heat to the affected area as often as told by your health care provider. Use the heat source that your health care provider recommends, such as a moist heat pack or a heating pad. Place a towel between your skin and the heat source. Leave the heat on for 20-30 minutes. If your skin turns bright red, remove the heat or ice right away to prevent skin damage. The risk of skin damage is higher if you cannot feel pain, heat, or cold. Activity Rest as told by your health care provider. Avoid activities that make pain worse. This includes activities that use the muscles in your chest, abdomen, and sides. You may have to avoid lifting. Ask your health care provider how much you can safely lift. Return to your normal activities as told by your health care provider. Ask your health care provider what activities are safe for you. General instructions Take over-the-counter and prescription medicines only as told by your health care provider. Contact a health care provider if: You have chills   or a fever. Your pain does not go away, or it gets worse. You have a cough that does not go away. Get help right away if: You feel short of breath. You have severe chest pain that does not get better with medicines, heat, or ice. These symptoms may be an emergency. Get help right away. Call 911. Do not wait to see if the symptoms will go away. Do not drive yourself to the hospital. This information is not intended to replace advice given to you by  your health care provider. Make sure you discuss any questions you have with your health care provider. Document Revised: 03/24/2022 Document Reviewed: 03/24/2022 Elsevier Patient Education  2024 [employer]  

## 2025-06-04 ENCOUNTER — Other Ambulatory Visit: Payer: Self-pay

## 2025-06-11 ENCOUNTER — Ambulatory Visit: Payer: Self-pay | Admitting: Physician Assistant
